# Patient Record
Sex: Female | Born: 1955 | Race: Black or African American | Hispanic: No | Marital: Single | Smoking: Current every day smoker
Health system: Southern US, Community
[De-identification: ages and names within clinical notes are randomized; demographics above are authoritative.]

## PROBLEM LIST (undated history)

## (undated) DIAGNOSIS — I5020 Unspecified systolic (congestive) heart failure: Secondary | ICD-10-CM

## (undated) DIAGNOSIS — E785 Hyperlipidemia, unspecified: Secondary | ICD-10-CM

## (undated) DIAGNOSIS — I5022 Chronic systolic (congestive) heart failure: Secondary | ICD-10-CM

## (undated) DIAGNOSIS — I4729 Other ventricular tachycardia: Secondary | ICD-10-CM

## (undated) DIAGNOSIS — Z91199 Patient's noncompliance with other medical treatment and regimen due to unspecified reason: Secondary | ICD-10-CM

## (undated) DIAGNOSIS — I2510 Atherosclerotic heart disease of native coronary artery without angina pectoris: Secondary | ICD-10-CM

## (undated) DIAGNOSIS — I2119 ST elevation (STEMI) myocardial infarction involving other coronary artery of inferior wall: Secondary | ICD-10-CM

## (undated) DIAGNOSIS — I255 Ischemic cardiomyopathy: Secondary | ICD-10-CM

## (undated) DIAGNOSIS — I10 Essential (primary) hypertension: Secondary | ICD-10-CM

---

## 2020-07-14 ENCOUNTER — Encounter: Payer: Self-pay | Admitting: Emergency Medicine

## 2020-07-14 ENCOUNTER — Ambulatory Visit (INDEPENDENT_AMBULATORY_CARE_PROVIDER_SITE_OTHER): Payer: Self-pay

## 2020-07-14 ENCOUNTER — Other Ambulatory Visit: Payer: Self-pay

## 2020-07-14 ENCOUNTER — Ambulatory Visit
Admission: EM | Admit: 2020-07-14 | Discharge: 2020-07-14 | Disposition: A | Discharge status: Home / Self Care | Payer: Self-pay | Attending: Sports Medicine | Admitting: Sports Medicine

## 2020-07-14 ENCOUNTER — Inpatient Hospital Stay
Admission: EM | Admit: 2020-07-14 | Discharge: 2020-07-20 | DRG: 280 | Disposition: A | Discharge status: Home / Self Care | Payer: Self-pay | Attending: Internal Medicine | Admitting: Internal Medicine

## 2020-07-14 ENCOUNTER — Encounter: Payer: Self-pay | Admitting: Internal Medicine

## 2020-07-14 DIAGNOSIS — Z79899 Other long term (current) drug therapy: Secondary | ICD-10-CM

## 2020-07-14 DIAGNOSIS — J189 Pneumonia, unspecified organism: Secondary | ICD-10-CM | POA: Insufficient documentation

## 2020-07-14 DIAGNOSIS — Z20822 Contact with and (suspected) exposure to covid-19: Secondary | ICD-10-CM | POA: Insufficient documentation

## 2020-07-14 DIAGNOSIS — J440 Chronic obstructive pulmonary disease with acute lower respiratory infection: Secondary | ICD-10-CM | POA: Diagnosis present

## 2020-07-14 DIAGNOSIS — F17200 Nicotine dependence, unspecified, uncomplicated: Secondary | ICD-10-CM | POA: Insufficient documentation

## 2020-07-14 DIAGNOSIS — I2109 ST elevation (STEMI) myocardial infarction involving other coronary artery of anterior wall: Secondary | ICD-10-CM | POA: Diagnosis present

## 2020-07-14 DIAGNOSIS — I2582 Chronic total occlusion of coronary artery: Secondary | ICD-10-CM | POA: Diagnosis present

## 2020-07-14 DIAGNOSIS — R0682 Tachypnea, not elsewhere classified: Secondary | ICD-10-CM | POA: Insufficient documentation

## 2020-07-14 DIAGNOSIS — R000 Tachycardia, unspecified: Secondary | ICD-10-CM | POA: Insufficient documentation

## 2020-07-14 DIAGNOSIS — F419 Anxiety disorder, unspecified: Secondary | ICD-10-CM | POA: Diagnosis present

## 2020-07-14 DIAGNOSIS — I5023 Acute on chronic systolic (congestive) heart failure: Secondary | ICD-10-CM | POA: Diagnosis present

## 2020-07-14 DIAGNOSIS — R059 Cough, unspecified: Secondary | ICD-10-CM

## 2020-07-14 DIAGNOSIS — D72829 Elevated white blood cell count, unspecified: Secondary | ICD-10-CM | POA: Diagnosis not present

## 2020-07-14 DIAGNOSIS — R0602 Shortness of breath: Secondary | ICD-10-CM

## 2020-07-14 DIAGNOSIS — I25119 Atherosclerotic heart disease of native coronary artery with unspecified angina pectoris: Secondary | ICD-10-CM | POA: Diagnosis present

## 2020-07-14 DIAGNOSIS — I2510 Atherosclerotic heart disease of native coronary artery without angina pectoris: Secondary | ICD-10-CM | POA: Diagnosis present

## 2020-07-14 DIAGNOSIS — F17213 Nicotine dependence, cigarettes, with withdrawal: Secondary | ICD-10-CM | POA: Diagnosis present

## 2020-07-14 DIAGNOSIS — J441 Chronic obstructive pulmonary disease with (acute) exacerbation: Secondary | ICD-10-CM | POA: Diagnosis present

## 2020-07-14 DIAGNOSIS — R0982 Postnasal drip: Secondary | ICD-10-CM | POA: Insufficient documentation

## 2020-07-14 DIAGNOSIS — I110 Hypertensive heart disease with heart failure: Secondary | ICD-10-CM | POA: Diagnosis present

## 2020-07-14 DIAGNOSIS — Z7982 Long term (current) use of aspirin: Secondary | ICD-10-CM | POA: Insufficient documentation

## 2020-07-14 DIAGNOSIS — I272 Pulmonary hypertension, unspecified: Secondary | ICD-10-CM | POA: Diagnosis present

## 2020-07-14 DIAGNOSIS — R1110 Vomiting, unspecified: Secondary | ICD-10-CM | POA: Insufficient documentation

## 2020-07-14 DIAGNOSIS — J9601 Acute respiratory failure with hypoxia: Secondary | ICD-10-CM | POA: Diagnosis present

## 2020-07-14 DIAGNOSIS — Z716 Tobacco abuse counseling: Secondary | ICD-10-CM

## 2020-07-14 DIAGNOSIS — Z9119 Patient's noncompliance with other medical treatment and regimen: Secondary | ICD-10-CM

## 2020-07-14 DIAGNOSIS — Z720 Tobacco use: Secondary | ICD-10-CM | POA: Diagnosis present

## 2020-07-14 DIAGNOSIS — T380X5A Adverse effect of glucocorticoids and synthetic analogues, initial encounter: Secondary | ICD-10-CM | POA: Diagnosis not present

## 2020-07-14 DIAGNOSIS — I255 Ischemic cardiomyopathy: Secondary | ICD-10-CM | POA: Diagnosis present

## 2020-07-14 DIAGNOSIS — E876 Hypokalemia: Secondary | ICD-10-CM | POA: Diagnosis present

## 2020-07-14 DIAGNOSIS — I340 Nonrheumatic mitral (valve) insufficiency: Secondary | ICD-10-CM | POA: Diagnosis present

## 2020-07-14 DIAGNOSIS — Y831 Surgical operation with implant of artificial internal device as the cause of abnormal reaction of the patient, or of later complication, without mention of misadventure at the time of the procedure: Secondary | ICD-10-CM | POA: Diagnosis present

## 2020-07-14 DIAGNOSIS — Z888 Allergy status to other drugs, medicaments and biological substances status: Secondary | ICD-10-CM

## 2020-07-14 DIAGNOSIS — I10 Essential (primary) hypertension: Secondary | ICD-10-CM | POA: Diagnosis present

## 2020-07-14 DIAGNOSIS — Z9114 Patient's other noncompliance with medication regimen: Secondary | ICD-10-CM

## 2020-07-14 DIAGNOSIS — I252 Old myocardial infarction: Secondary | ICD-10-CM

## 2020-07-14 DIAGNOSIS — I213 ST elevation (STEMI) myocardial infarction of unspecified site: Secondary | ICD-10-CM

## 2020-07-14 DIAGNOSIS — R0902 Hypoxemia: Secondary | ICD-10-CM | POA: Insufficient documentation

## 2020-07-14 DIAGNOSIS — I472 Ventricular tachycardia: Secondary | ICD-10-CM | POA: Diagnosis not present

## 2020-07-14 DIAGNOSIS — J40 Bronchitis, not specified as acute or chronic: Secondary | ICD-10-CM | POA: Diagnosis present

## 2020-07-14 DIAGNOSIS — E785 Hyperlipidemia, unspecified: Secondary | ICD-10-CM | POA: Diagnosis present

## 2020-07-14 DIAGNOSIS — T82867A Thrombosis of cardiac prosthetic devices, implants and grafts, initial encounter: Principal | ICD-10-CM | POA: Diagnosis present

## 2020-07-14 LAB — CBC WITH DIFFERENTIAL/PLATELET
Abs Immature Granulocytes: 0.02 10*3/uL (ref 0.00–0.07)
Basophils Absolute: 0 10*3/uL (ref 0.0–0.1)
Basophils Relative: 1 %
Eosinophils Absolute: 0 10*3/uL (ref 0.0–0.5)
Eosinophils Relative: 0 %
HCT: 42 % (ref 36.0–46.0)
Hemoglobin: 14.2 g/dL (ref 12.0–15.0)
Immature Granulocytes: 0 %
Lymphocytes Relative: 14 %
Lymphs Abs: 0.8 10*3/uL (ref 0.7–4.0)
MCH: 35 pg — ABNORMAL HIGH (ref 26.0–34.0)
MCHC: 33.8 g/dL (ref 30.0–36.0)
MCV: 103.4 fL — ABNORMAL HIGH (ref 80.0–100.0)
Monocytes Absolute: 0.3 10*3/uL (ref 0.1–1.0)
Monocytes Relative: 6 %
Neutro Abs: 4.6 10*3/uL (ref 1.7–7.7)
Neutrophils Relative %: 79 %
Platelets: 219 10*3/uL (ref 150–400)
RBC: 4.06 MIL/uL (ref 3.87–5.11)
RDW: 12.9 % (ref 11.5–15.5)
WBC: 5.8 10*3/uL (ref 4.0–10.5)
nRBC: 0 % (ref 0.0–0.2)

## 2020-07-14 LAB — COMPREHENSIVE METABOLIC PANEL
ALT: 19 U/L (ref 0–44)
AST: 85 U/L — ABNORMAL HIGH (ref 15–41)
Albumin: 4.3 g/dL (ref 3.5–5.0)
Alkaline Phosphatase: 75 U/L (ref 38–126)
Anion gap: 12 (ref 5–15)
BUN: 10 mg/dL (ref 8–23)
CO2: 28 mmol/L (ref 22–32)
Calcium: 9.3 mg/dL (ref 8.9–10.3)
Chloride: 98 mmol/L (ref 98–111)
Creatinine, Ser: 0.76 mg/dL (ref 0.44–1.00)
GFR, Estimated: >60 mL/min (ref >60)
Glucose, Bld: 109 mg/dL — ABNORMAL HIGH (ref 70–99)
Potassium: 3.6 mmol/L (ref 3.5–5.1)
Sodium: 138 mmol/L (ref 135–145)
Total Bilirubin: 1.5 mg/dL — ABNORMAL HIGH (ref 0.3–1.2)
Total Protein: 8.2 g/dL — ABNORMAL HIGH (ref 6.5–8.1)

## 2020-07-14 LAB — RESP PANEL BY RT-PCR (FLU A&B, COVID) ARPGX2
Influenza A by PCR: NEGATIVE
Influenza B by PCR: NEGATIVE
SARS Coronavirus 2 by RT PCR: NEGATIVE

## 2020-07-14 LAB — PROTIME-INR
INR: 1 (ref 0.8–1.2)
Prothrombin Time: 12.8 seconds (ref 11.4–15.2)

## 2020-07-14 LAB — LACTIC ACID, PLASMA: Lactic Acid, Venous: 1.5 mmol/L (ref 0.5–1.9)

## 2020-07-14 LAB — HIV ANTIBODY (ROUTINE TESTING W REFLEX): HIV Screen 4th Generation wRfx: NONREACTIVE

## 2020-07-14 LAB — TSH: TSH: 0.799 u[IU]/mL (ref 0.350–4.500)

## 2020-07-14 LAB — APTT: aPTT: 29 seconds (ref 24–36)

## 2020-07-14 MED ORDER — LORAZEPAM 2 MG/ML IJ SOLN
0.5000 mg | Freq: Once | INTRAMUSCULAR | Status: AC
  Administered 2020-07-14: 0.5 mg via INTRAVENOUS
  Filled 2020-07-14: qty 1

## 2020-07-14 MED ORDER — IPRATROPIUM-ALBUTEROL 0.5-2.5 (3) MG/3ML IN SOLN
3.0000 mL | Freq: Four times a day (QID) | RESPIRATORY_TRACT | Status: DC
  Administered 2020-07-14 – 2020-07-15 (×3): 3 mL via RESPIRATORY_TRACT
  Filled 2020-07-14 (×3): qty 3

## 2020-07-14 MED ORDER — LACTATED RINGERS IV SOLN: INTRAVENOUS | Status: DC

## 2020-07-14 MED ORDER — SODIUM CHLORIDE 0.9 % IV SOLN
500.0000 mg | INTRAVENOUS | Status: DC
  Administered 2020-07-14 – 2020-07-18 (×5): 500 mg via INTRAVENOUS
  Filled 2020-07-14 (×6): qty 500

## 2020-07-14 MED ORDER — ACETAMINOPHEN 650 MG RE SUPP: 325.0000 mg | Freq: Four times a day (QID) | RECTAL | Status: DC | PRN

## 2020-07-14 MED ORDER — NICOTINE 14 MG/24HR TD PT24
14.0000 mg | MEDICATED_PATCH | Freq: Every day | TRANSDERMAL | Status: DC
  Administered 2020-07-15 – 2020-07-20 (×5): 14 mg via TRANSDERMAL
  Filled 2020-07-14 (×6): qty 1

## 2020-07-14 MED ORDER — SODIUM CHLORIDE 0.9 % IV SOLN
2.0000 g | INTRAVENOUS | Status: DC
  Administered 2020-07-14 – 2020-07-18 (×5): 2 g via INTRAVENOUS
  Filled 2020-07-14 (×2): qty 2
  Filled 2020-07-14 (×3): qty 20
  Filled 2020-07-14: qty 2

## 2020-07-14 MED ORDER — DILTIAZEM HCL 25 MG/5ML IV SOLN
10.0000 mg | Freq: Once | INTRAVENOUS | Status: AC
  Administered 2020-07-14: 10 mg via INTRAVENOUS
  Filled 2020-07-14: qty 5

## 2020-07-14 MED ORDER — ACETAMINOPHEN 325 MG PO TABS: 325.0000 mg | ORAL_TABLET | Freq: Four times a day (QID) | ORAL | Status: DC | PRN

## 2020-07-14 MED ORDER — LACTATED RINGERS IV BOLUS
500.0000 mL | Freq: Once | INTRAVENOUS | Status: AC
  Administered 2020-07-14: 500 mL via INTRAVENOUS

## 2020-07-14 MED ORDER — HYDRALAZINE HCL 50 MG PO TABS
25.0000 mg | ORAL_TABLET | Freq: Three times a day (TID) | ORAL | Status: DC
  Administered 2020-07-14 – 2020-07-17 (×8): 25 mg via ORAL
  Filled 2020-07-14 (×8): qty 1

## 2020-07-14 MED ORDER — SODIUM CHLORIDE 0.9 % IV BOLUS (SEPSIS)
1000.0000 mL | Freq: Once | INTRAVENOUS | Status: AC
  Administered 2020-07-14: 1000 mL via INTRAVENOUS

## 2020-07-14 MED ORDER — ONDANSETRON HCL 4 MG/2ML IJ SOLN: 4.0000 mg | Freq: Four times a day (QID) | INTRAMUSCULAR | Status: DC | PRN

## 2020-07-14 MED ORDER — ONDANSETRON HCL 4 MG PO TABS: 4.0000 mg | ORAL_TABLET | Freq: Four times a day (QID) | ORAL | Status: DC | PRN

## 2020-07-14 MED ORDER — ENOXAPARIN SODIUM 40 MG/0.4ML ~~LOC~~ SOLN
40.0000 mg | SUBCUTANEOUS | Status: DC
  Administered 2020-07-14: 22:00:00 40 mg via SUBCUTANEOUS
  Filled 2020-07-14: qty 0.4

## 2020-07-14 MED ORDER — ORAL CARE MOUTH RINSE
15.0000 mL | Freq: Two times a day (BID) | OROMUCOSAL | Status: DC
  Administered 2020-07-16 – 2020-07-20 (×8): 15 mL via OROMUCOSAL

## 2020-07-14 NOTE — ED Notes (Signed)
Patient sleeping. No acute distress

## 2020-07-14 NOTE — ED Triage Notes (Signed)
Patient c/o cough, shortness of breath that started yesterday. She states she has a history of getting bronchitis. Denies fever.

## 2020-07-14 NOTE — H&P (Addendum)
History and Physical   Elizabeth Herring YFV:[id number] DOB: 1955/07/26 DOA: 07/14/2020  PCP: Elizabeth Herring  Patient coming from: Urgent clinic  I have personally briefly reviewed patient's old medical records in [facility] EMR.  Chief Concern: Shortness of breath  HPI: Elizabeth Herring is a 65 y.o. female with medical history significant for tobacco abuse, presented to the emergency department from urgent care center for chief concerns of shortness of breath.  At the management clinic, patient desatted to 87% on room air.  She states that she has been short of breath for 2 days.  She denies fever, chills.  She endorses cough that is productive of yellow dark sputum.  She denies chest pain and feeling like this before.  She denies sick contacts.  She does not know how much cigarettes she smokes daily however she states that her and her friends switch on and off sharing cigarettes.  At bedside, patient was able to tell me her name and age and the location of hospital.  She is using accessory muscles to breathe however she took her nasal cannula off stating that she wants to go to the restroom to urinate.  Social history: She lives with her sister.  She is retired and formerly worked at the [employer].  She smokes tobacco cigarettes daily.  Denies alcohol and recreational drug use.  ROS: Constitutional: no weight change, no fever ENT/Mouth: no sore throat, no rhinorrhea Eyes: no eye pain, no vision changes Cardiovascular: no chest pain, no dyspnea,  no edema, no palpitations Respiratory: no cough, no sputum, no wheezing Gastrointestinal: no nausea, no vomiting, no diarrhea, no constipation Genitourinary: no urinary incontinence, no dysuria, no hematuria Musculoskeletal: no arthralgias, no myalgias Skin: no skin lesions, no pruritus, Neuro: + weakness, no loss of consciousness, no syncope Psych: no anxiety, no depression, + decrease appetite Heme/Lymph: no bruising,  no bleeding  ED Course: Discussed with ED provider, patient requiring hospitalization due to hypoxia secondary to pneumonia.  Vitals in the ED was initially afebrile with temperature of 98.1, respiration rate of 21, heart rate 122, blood pressure 202/132, satting at 94% on 3 L nasal cannula.  Assessment/Plan  Active Problems:   PNA (pneumonia)   Bronchitis due to tobacco use   Essential hypertension   Pneumonia-ceftriaxone and azithromycin -Duo nebs every 6 hours including now  Tobacco abuse-Nicotine patch  Sinus tachycardia and elevated BP-Cardizem injection 10 mg once -EKG ordered and pending  Hypertension-Cardizem injection 10 mg once -Hydralazine 25 mg every 8 hours p.o. -Recommend follow-up with primary care provider for essential hypertension chronic management  Possible COPD recommend outpatient pulmonary function testing  Chart reviewed.   DVT prophylaxis: Enoxaparin Code Status: Full code Diet: Regular Family Communication: No Disposition Plan: Pending clinical course Consults called: None at this time Admission status: Observation, MedSurg  No past medical history on file.  No past surgical history on file.  Social History:  reports that she has been smoking. She has never used smokeless tobacco. She reports current alcohol use. She reports that she does not use drugs.  No Known Allergies No family history on file. Family history: Family history reviewed and not pertinent  Prior to Admission medications   Not on File   Physical Exam: Vitals:   07/14/20 1626 07/14/20 1627  BP: (!) 159/105   Pulse: (!) 115   Resp: (!) 24   Temp: 98.1 F (36.7 C)   TempSrc: Oral   SpO2: 98%   Weight:  59 kg  Height:  5' (1.524 m)   Constitutional: appears age-appropriate, NAD, calm, comfortable Eyes: PERRL, lids and conjunctivae normal ENMT: Mucous membranes are moist. Posterior pharynx clear of any exudate or lesions. Age-appropriate dentition. Hearing  appropriate Neck: normal, supple, no masses, no thyromegaly Respiratory: clear to auscultation bilaterally, no wheezing, no crackles. Normal respiratory effort. No accessory muscle use.  Cardiovascular: Sinus tachycardia, mild wheezing throughout. No extremity edema. 2+ pedal pulses. No carotid bruits.  Abdomen: no tenderness, no masses palpated, no hepatosplenomegaly. Bowel sounds positive.  Musculoskeletal: no clubbing / cyanosis. No joint deformity upper and lower extremities. Good ROM, no contractures, no atrophy. Normal muscle tone.  Skin: no rashes, lesions, ulcers. No induration Neurologic: Sensation intact. Strength 5/5 in all 4.  Psychiatric: Normal judgment and insight. Alert and oriented x 3. Normal mood.   EKG: I personally reviewed showing sinus tachycardia with rate of 109, QTc 441.  Chest x-ray on Admission: I personally reviewed and I agree with radiologist reading as below.  DG Chest 2 View  Result Date: 07/14/2020 CLINICAL DATA:  Cough, shortness of breath EXAM: CHEST - 2 VIEW COMPARISON:  None. FINDINGS: Patchy right lower lobe opacity, suspicious for pneumonia. Suspected small bilateral pleural effusions. No frank interstitial edema. The heart is top-normal in size. Visualized osseous structures are within normal limits. IMPRESSION: Patchy right lower lobe opacity, suspicious for pneumonia. Suspected small bilateral pleural effusions. Electronically Signed   By: Charline Bills M.D.   On: 07/14/2020 14:32   Labs on Admission: I have personally reviewed following labs  CBC: Recent Labs  Lab 07/14/20 1655  WBC 5.8  NEUTROABS 4.6  HGB 14.2  HCT 42.0  MCV 103.4*  PLT 219   Basic Metabolic Panel: Recent Labs  Lab 07/14/20 1655  NA 138  K 3.6  CL 98  CO2 28  GLUCOSE 109*  BUN 10  CREATININE 0.76  CALCIUM 9.3   GFR: Estimated Creatinine Clearance: 57.8 mL/min (by C-G formula based on SCr of 0.76 mg/dL). Liver Function Tests: Recent Labs  Lab  07/14/20 1655  AST 85*  ALT 19  ALKPHOS 75  BILITOT 1.5*  PROT 8.2*  ALBUMIN 4.3   Coagulation Profile: Recent Labs  Lab 07/14/20 1655  INR 1.0   Elizabeth Herring D.O. [facility]  If 7PM-7AM, please contact overnight-coverage provider If 7AM-7PM, please contact day coverage provider [email or web address]  07/14/2020, 6:00 PM

## 2020-07-14 NOTE — ED Notes (Signed)
Patient is being discharged from the Urgent Care and sent to the Emergency Department via EMS . Per Dr. Zachery Dauer, patient is in need of higher level of care due to hypoxia, oxygen 87% on RA, pneumonia. Patient is aware and verbalizes understanding of plan of care.  Vitals:   07/14/20 1408  BP: (!) 169/122  Pulse: (!) 123  Resp: (!) 22  Temp: 98.6 F (37 C)  SpO2: 91%

## 2020-07-14 NOTE — ED Provider Notes (Signed)
[facility] Emergency Department Provider Note   ____________________________________________   Event Date/Time   First MD Initiated Contact with Patient 07/14/20 1621     (approximate)  I have reviewed the triage vital signs and the nursing notes.   HISTORY  Chief Complaint Cough    HPI Tessah Patchen is a 65 y.o. female with a past medical history of tobacco abuse who presents from urgent care via EMS after being found to have a right-sided pneumonia been having cough and chest pain over the last 5 days that has been worsening.  Patient describes a 7/10, aching, nonradiating substernal chest pain that is worse with coughing.  Patient was noted to be hypoxic to 87% on room air and dipped lower upon ambulation.  Patient placed on 3 L nasal cannula prior to arrival with improvement in her oxygenation.  Patient currently denies any vision changes, tinnitus, difficulty speaking, facial droop, sore throat, abdominal pain, nausea/vomiting/diarrhea, dysuria, or weakness/numbness/paresthesias in any extremity         No past medical history on file.  There are no problems to display for this patient.   No past surgical history on file.  Prior to Admission medications   Not on File    Allergies Patient has no known allergies.  No family history on file.  Social History Social History   Tobacco Use  . Smoking status: Current Every Day Smoker  . Smokeless tobacco: Never Used  Vaping Use  . Vaping Use: Never used  Substance Use Topics  . Alcohol use: Yes  . Drug use: Never    Review of Systems Constitutional: No fever/chills Eyes: No visual changes. ENT: No sore throat.  Endorses productive cough Cardiovascular: Endorses chest pain. Respiratory: Endorses shortness of breath. Gastrointestinal: No abdominal pain.  No nausea, no vomiting.  No diarrhea. Genitourinary: Negative for dysuria. Musculoskeletal: Negative for acute  arthralgias Skin: Negative for rash. Neurological: Negative for headaches, weakness/numbness/paresthesias in any extremity Psychiatric: Negative for suicidal ideation/homicidal ideation   ____________________________________________   PHYSICAL EXAM:  VITAL SIGNS: ED Triage Vitals  Enc Vitals Group     BP 07/14/20 1626 (!) 159/105     Pulse Rate 07/14/20 1626 (!) 115     Resp 07/14/20 1626 (!) 24     Temp 07/14/20 1626 98.1 F (36.7 C)     Temp Source 07/14/20 1626 Oral     SpO2 07/14/20 1626 98 %     Weight 07/14/20 1627 130 lb (59 kg)     Height 07/14/20 1627 5' (1.524 m)     Head Circumference --      Peak Flow --      Pain Score 07/14/20 1627 0     Pain Loc --      Pain Edu? --      Excl. in GC? --    Constitutional: Alert and oriented. Well appearing and in no acute distress. Eyes: Conjunctivae are normal. PERRL. Head: Atraumatic. Nose: No congestion/rhinnorhea. Mouth/Throat: Mucous membranes are moist. Neck: No stridor Cardiovascular: Grossly normal heart sounds.  Good peripheral circulation. Respiratory: Tachypneic.  3 L nasal cannula in place Gastrointestinal: Soft and nontender. No distention. Musculoskeletal: No obvious deformities Neurologic:  Normal speech and language. No gross focal neurologic deficits are appreciated. Skin:  Skin is warm and dry. No rash noted. Psychiatric: Mood and affect are normal. Speech and behavior are normal.  ____________________________________________   LABS (all labs ordered are listed, but only abnormal results are displayed)  Labs Reviewed  COMPREHENSIVE METABOLIC PANEL - Abnormal; Notable for the following components:      Result Value   Glucose, Bld 109 (*)    Total Protein 8.2 (*)    AST 85 (*)    Total Bilirubin 1.5 (*)    All other components within normal limits  CBC WITH DIFFERENTIAL/PLATELET - Abnormal; Notable for the following components:   MCV 103.4 (*)    MCH 35.0 (*)    All other components within  normal limits  RESP PANEL BY RT-PCR (FLU A&B, COVID) ARPGX2  CULTURE, BLOOD (ROUTINE X 2)  CULTURE, BLOOD (ROUTINE X 2)  URINE CULTURE  LACTIC ACID, PLASMA  PROTIME-INR  APTT  LACTIC ACID, PLASMA  URINALYSIS, COMPLETE (UACMP) WITH MICROSCOPIC   RADIOLOGY  ED MD interpretation: 2 view x-ray of the chest shows patchy right lower lobe opacity suspicious for pneumonia  Official radiology report(s): DG Chest 2 View  Result Date: 07/14/2020 CLINICAL DATA:  Cough, shortness of breath EXAM: CHEST - 2 VIEW COMPARISON:  None. FINDINGS: Patchy right lower lobe opacity, suspicious for pneumonia. Suspected small bilateral pleural effusions. No frank interstitial edema. The heart is top-normal in size. Visualized osseous structures are within normal limits. IMPRESSION: Patchy right lower lobe opacity, suspicious for pneumonia. Suspected small bilateral pleural effusions. Electronically Signed   By: Charline Bills M.D.   On: 07/14/2020 14:32    ____________________________________________   PROCEDURES  Procedure(s) performed (including Critical Care):  .Critical Care Performed by: Merwyn Katos, MD Authorized by: Merwyn Katos, MD   Critical care provider statement:    Critical care time (minutes):  35   Critical care time was exclusive of:  Separately billable procedures and treating other patients   Critical care was necessary to treat or prevent imminent or life-threatening deterioration of the following conditions:  Respiratory failure   Critical care was time spent personally by me on the following activities:  Discussions with consultants, evaluation of patient's response to treatment, examination of patient, ordering and performing treatments and interventions, ordering and review of laboratory studies, ordering and review of radiographic studies, pulse oximetry, re-evaluation of patient's condition, obtaining history from patient or surrogate and review of old charts   I assumed  direction of critical care for this patient from another provider in my specialty: no     Care discussed with: admitting provider   .1-3 Lead EKG Interpretation Performed by: Merwyn Katos, MD Authorized by: Merwyn Katos, MD     Interpretation: abnormal     ECG rate:  111   ECG rate assessment: tachycardic     Rhythm: sinus tachycardia     Ectopy: none     Conduction: normal       ____________________________________________   INITIAL IMPRESSION / ASSESSMENT AND PLAN / ED COURSE  As part of my medical decision making, I reviewed the following data within the electronic MEDICAL RECORD NUMBER Nursing notes reviewed and incorporated, Labs reviewed, EKG interpreted, Old chart reviewed, Radiograph reviewed and Notes from prior ED visits reviewed and incorporated        Presents with shortness of breath, cough, and malaise concerning for pneumonia.  DDx: PE, COPD exacerbation, Pneumothorax, TB, Atypical ACS, Esophageal Rupture, Toxic Exposure, Foreign Body Airway Obstruction.  Workup: CXR CBC, CMP, lactate, troponin  Given History, Exam, and Workup presentation most consistent with pneumonia.  Findings: Chest x-ray showing right lower lobe pneumonia  Tx: Ceftriaxone 1g IV Azithromycin 500mg  IV  1646 Reassessment: As patient is continuing to require  supplemental oxygenation for acute hypoxic respiratory failure, patient will require admission to the internal medicine service for further evaluation and management  Disposition: Admit      ____________________________________________   FINAL CLINICAL IMPRESSION(S) / ED DIAGNOSES  Final diagnoses:  None     ED Discharge Orders    None       Note:  This document was prepared using Dragon voice recognition software and may include unintentional dictation errors.   Merwyn Katos, MD 07/14/20 [phone]

## 2020-07-14 NOTE — ED Notes (Signed)
Ambulated patient from her room to the nurses station which is approx 30 feet. Patient became very SOB and oxygen sat dropped to 87% and heart rate increased to 136.

## 2020-07-14 NOTE — ED Notes (Signed)
Previous EKG did not cross over. Another EKG performed and Dr. Sedalia Muta notified.

## 2020-07-14 NOTE — ED Provider Notes (Signed)
[facility]    CSN: [id number] Arrival date & time: 07/14/20  1352      History   Chief Complaint Chief Complaint  Patient presents with  . Cough  . Shortness of Breath    HPI Elizabeth Herring is a 65 y.o. female.   Patient is a very pleasant 65 year old female who presents for evaluation of the above issue.  Patient has recently relocated to [facility] and does not have a primary care physician.  She lived in [place] for 4 years.  She is currently retired and stays at home.  She does admit to tobacco abuse.  She reports 2 days of cough and shortness of breath with activity.  She says she really has no symptoms at rest.  Her cough is productive of clear sputum.  She denies any fever shakes chills.  She did have one episode of post tussive emesis.  She denies any significant chest pain at the present time.  She also reports some postnasal drip and reports that she has chronic sinus issues.  No facial pain or pressure currently.  She takes Claritin for allergies.  She denies any COVID history of Covid exposure.  She has been vaccinated x2 and has received the booster.  No flu shot.  No nausea or diarrhea.  No abdominal pain or urinary symptoms.  When I came into the room the patient was tachypneic and tachycardic with O2 saturations of 94%.  This was at rest.       History reviewed. No pertinent past medical history.  Patient Active Problem List   Diagnosis Date Noted  . Pneumonia 07/15/2020  . PNA (pneumonia) 07/14/2020  . Bronchitis due to tobacco use 07/14/2020  . Essential hypertension 07/14/2020    Past Surgical History:  Procedure Laterality Date  . CORONARY/GRAFT ACUTE MI REVASCULARIZATION N/A 07/15/2020   Procedure: Coronary/Graft Acute MI Revascularization;  Surgeon: Alwyn Pea, MD;  Location: [facility] INVASIVE CV LAB;  Service: Cardiovascular;  Laterality: N/A;  . LEFT HEART CATH AND CORONARY ANGIOGRAPHY N/A 07/15/2020   Procedure: LEFT  HEART CATH AND CORONARY ANGIOGRAPHY;  Surgeon: Alwyn Pea, MD;  Location: [facility] INVASIVE CV LAB;  Service: Cardiovascular;  Laterality: N/A;    OB History   No obstetric history on file.      Home Medications    Prior to Admission medications   Medication Sig Start Date End Date Taking? Authorizing Provider  aspirin 81 MG EC tablet Take 81 mg by mouth daily.    [provider]  cetirizine (ZYRTEC) 10 MG tablet Take 10 mg by mouth daily.    [provider]  Multiple Vitamin (MULTI-VITAMIN) tablet Take 1 tablet by mouth daily.    [provider]    Family History History reviewed. No pertinent family history.  Social History Social History   Tobacco Use  . Smoking status: Current Every Day Smoker  . Smokeless tobacco: Never Used  Vaping Use  . Vaping Use: Never used  Substance Use Topics  . Alcohol use: Yes  . Drug use: Never     Allergies   Chlorhexidine   Review of Systems Review of Systems  Constitutional: Positive for fatigue. Negative for activity change, appetite change, chills, diaphoresis and fever.  HENT: Positive for congestion and postnasal drip. Negative for ear discharge, ear pain, sinus pressure, sinus pain and sore throat.   Eyes: Negative.   Respiratory: Positive for cough, chest tightness and shortness of breath.   Cardiovascular: Negative for chest  pain and palpitations.  Gastrointestinal: Negative.   Genitourinary: Negative.   Musculoskeletal: Negative.  Negative for myalgias.  Skin: Negative for rash.  Neurological: Positive for weakness. Negative for dizziness, tremors, syncope, speech difficulty, light-headedness, numbness and headaches.  All other systems reviewed and are negative.    Physical Exam Triage Vital Signs ED Triage Vitals  Enc Vitals Group     BP 07/14/20 1408 (!) 169/122     Pulse Rate 07/14/20 1408 (!) 123     Resp 07/14/20 1408 (!) 22     Temp 07/14/20 1408 98.6 F (37 C)     Temp  Source 07/14/20 1408 Oral     SpO2 07/14/20 1408 91 %     Weight 07/14/20 1406 130 lb (59 kg)     Height 07/14/20 1406 5' (1.524 m)     Head Circumference --      Peak Flow --      Pain Score 07/14/20 1406 8     Pain Loc --      Pain Edu? --      Excl. in GC? --    No data found.  Updated Vital Signs BP (!) 169/122 (BP Location: Left Arm)   Pulse (!) 120   Temp 98.6 F (37 C) (Oral)   Resp (!) 22   Ht 5' (1.524 m)   Wt 59 kg   SpO2 94%   BMI 25.39 kg/m   Visual Acuity Right Eye Distance:   Left Eye Distance:   Bilateral Distance:    Right Eye Near:   Left Eye Near:    Bilateral Near:     Physical Exam Vitals and nursing note reviewed.  Constitutional:      General: She is in acute distress.     Appearance: She is well-developed. She is not ill-appearing, toxic-appearing or diaphoretic.  HENT:     Head: Normocephalic and atraumatic.     Right Ear: Tympanic membrane normal.     Left Ear: Tympanic membrane normal.     Nose: Nose normal.     Mouth/Throat:     Mouth: Mucous membranes are moist.     Pharynx: Oropharynx is clear.  Eyes:     Extraocular Movements: Extraocular movements intact.     Pupils: Pupils are equal, round, and reactive to light.  Neck:     Thyroid: No thyromegaly.     Vascular: No hepatojugular reflux or JVD.     Trachea: No tracheal deviation.  Cardiovascular:     Rate and Rhythm: Regular rhythm. Tachycardia present.  No extrasystoles are present.    Pulses: No decreased pulses.     Heart sounds: Normal heart sounds. No murmur heard. No friction rub. No gallop.   Pulmonary:     Effort: Tachypnea present.     Breath sounds: Examination of the right-lower field reveals decreased breath sounds. Decreased breath sounds present. No wheezing, rhonchi or rales.  Abdominal:     General: Bowel sounds are normal.     Palpations: Abdomen is soft.     Tenderness: There is no abdominal tenderness. There is no guarding or rebound.  Musculoskeletal:      Cervical back: Normal range of motion and neck supple.  Lymphadenopathy:     Cervical: No cervical adenopathy.  Skin:    General: Skin is warm and dry.     Capillary Refill: Capillary refill takes less than 2 seconds.     Findings: No rash.  Neurological:     General: No  focal deficit present.     Mental Status: She is alert.      UC Treatments / Results  Labs (all labs ordered are listed, but only abnormal results are displayed) Labs Reviewed  SARS CORONAVIRUS 2 (TAT 6-24 HRS)    EKG   Radiology CARDIAC CATHETERIZATION  Result Date: 07/15/2020  Prox RCA lesion is 100% stenosed. CTO  Mid LM lesion is 25% stenosed.  Dist LAD lesion is 95% stenosed.  Prox LAD to Dist LAD lesion is 75% stenosed.  Dist LM to Prox LAD lesion is 50% stenosed.  Prox Cx to Mid Cx lesion is 100% stenosed. In-stent thrombosis of the stent IRA TIMI 0 flow  Moderate collaterals left to right left to left  Left ventricular function severely depressed left ventricular enlargement EF between 25 and 30% globally  Unsuccessful attempted PCI and stent to ostial old stent to the circumflex with in-stent thrombosis failure to cross with a wire  Conclusion STEMI presentation in house late recognition Troponins were greater than 27,000 EKG had diffuse ST elevation inferior laterally at admission and 24 hours later when STEMI was called Diagnostic cardiac cath showed severely depressed left ventricular function ejection fraction was between 25 and 30% with left ventricular enlargement Coronaries Left main large minor distal disease LAD was large diffuse 50 to 75% proximal to mid, diffuse 75 to 95% mid to distal Circumflex is large ostial stent occluded thrombotic in-stent thrombosis from an old stent TIMI 0 flow RCA medium to small a completely occluded proximally CTO Intervention Unsuccessful attempt at PCI and stent of in-stent thrombosis of old proximal stent to circumflex Unable to cross with a wire Patient  developed heart failure type symptoms Treated with Lasix Groin was mynx Patient was transferred to ICU for aggressive medical therapy for heart failure and post MI care   DG Chest Port 1 View  Result Date: 07/15/2020 CLINICAL DATA:  Pneumonia.  Shortness of breath.  Cough.  Fever. EXAM: PORTABLE CHEST 1 VIEW COMPARISON:  07/14/2020. FINDINGS: Cardiomegaly with and pulmonary venous congestion. Right base infiltrate most consistent pneumonia. Asymmetric edema could also present this fashion. Small moderate right-sided pleural effusion. No pneumothorax. IMPRESSION: 1. Cardiomegaly with pulmonary venous congestion. 2. Right base infiltrate most consistent with pneumonia. Asymmetric pulmonary edema could also present this fashion. Small moderate right pleural effusion. Electronically Signed   By: Maisie Fus  Register   On: 07/15/2020 12:32    Procedures Procedures (including critical care time)  Medications Ordered in UC Medications - No data to display  Initial Impression / Assessment and Plan / UC Course  I have reviewed the triage vital signs and the nursing notes.  Pertinent labs & imaging results that were available during my care of the patient were reviewed by me and considered in my medical decision making (see chart for details).  Clinical impression: 65 year old female with 2 days of cough and shortness of breath with activity.  When she presents today she has tachypneic to 22 breaths/min and is tachycardic to 120 bpm.  O2 saturation 94% on room air.  Treatment plan: 1.  The findings and treatment plan were discussed in detail with the patient.  Patient was in agreement. 2.  I recommended getting a chest x-ray.  The results are above.  Does show evidence suspicious for pneumonia. 3.  I asked the nursing staff to ambulate her in the office. She was only able to get about 30 feet before getting more tachypneic and her O2 saturations dropped to 87%. 4.  My concern is that she may be dry and  that she may have a more significant pneumonia.  She is very symptomatic with shortness of breath on exertion, hypoxia, tachypnea, and tachycardia. 5.  I consider getting an EKG, but given her clinical situation I felt it best to transfer her to the emergency room for higher level of care.  I did not draw any labs as I did not want to compromise her care in any way by delaying her treatment. 6.  EMS was contacted and the patient was transported to the emergency room.  She was stable but her condition was guarded at discharge. 7.  Her sister was in the car and came into the room and I fully updated her on the situation.  She indicated she had a family situation to take care of but would meet her sister in the emergency room when she completed that.  Patient was transferred via EMS.    Final Clinical Impressions(s) / UC Diagnoses   Final diagnoses:  Pneumonia of right lower lobe due to infectious organism  Shortness of breath  Hypoxia  Tachypnea  Tachycardia     Discharge Instructions     Your x-ray does show a right lower lobe pneumonia.  Also shows some pleural effusions which is fluid in your lungs.  Your oxygen saturation is quite low at rest and goes even lower with walking around where you get significantly fatigued.  Your breathing very fast.  My concern is that you will tire out at home.  I have recommended you go to the emergency room for further evaluation and potential admission to the hospital. I have contacted EMS and they will take you to the emergency room.    ED Prescriptions    None     PDMP not reviewed this encounter.   Delton SeeBarnes, Nelly Scriven, MD 07/16/20 [phone]

## 2020-07-14 NOTE — ED Notes (Addendum)
Patient was calling out from room. Patient had scooted down to end of bed stating that she cannot breathe. Patient was tachypnic and very anxious. Sats were 98% on 3L. Advised MD who ordered ativan for patient.

## 2020-07-14 NOTE — ED Notes (Signed)
Patient placed on 3L O2 via [place]. Oxygen sat improved to 94%.

## 2020-07-14 NOTE — Progress Notes (Signed)
CODE SEPSIS - PHARMACY COMMUNICATION  **Broad Spectrum Antibiotics should be administered within 1 hour of Sepsis diagnosis**  Time Code Sepsis Called/Page Received: 1638  Antibiotics Ordered: ceftriaxone/azithromycin  Time of 1st antibiotic administration: 1707     Pricilla Riffle ,PharmD Clinical Pharmacist  07/14/2020  5:19 PM

## 2020-07-14 NOTE — ED Notes (Signed)
Provided sandwich tray per patient request.

## 2020-07-14 NOTE — Sepsis Progress Note (Signed)
eLink is monitoring this Code Sepsis. °

## 2020-07-14 NOTE — ED Triage Notes (Signed)
Patient reported via EMS for pneumonia diagnosis at urgent care. States she was 88% on room air after ambulation. Reports she has been coughing for 1 day. No fever.

## 2020-07-14 NOTE — ED Notes (Signed)
Patient up to bathroom with assistance.

## 2020-07-14 NOTE — Discharge Instructions (Addendum)
Your x-ray does show a right lower lobe pneumonia.  Also shows some pleural effusions which is fluid in your lungs.  Your oxygen saturation is quite low at rest and goes even lower with walking around where you get significantly fatigued.  Your breathing very fast.  My concern is that you will tire out at home.  I have recommended you go to the emergency room for further evaluation and potential admission to the hospital. I have contacted EMS and they will take you to the emergency room.

## 2020-07-14 NOTE — Subjective & Objective (Signed)
History and Physical   Elizabeth Herring HWE:[id number] DOB: May 26, 1955 DOA: 07/14/2020  PCP: Patient, No Pcp Per (Confirm with patient/family/NH records and if not entered, this has to be entered at [facility] point of entry) Outpatient Specialists: *** Consulting civil engineer speciality and name if known) Patient coming from:   I have personally briefly reviewed patient's old medical records in [facility] EMR.  Chief Concern: ***  HPI: Elizabeth Herring is a 65 y.o. female with medical history significant for ***   (The initial 2-3 lines should be focused and good to copy and paste in the HPI section of the daily progress note).   (For level 3, the HPI must include 4+ descriptors: Location, Quality, Severity, Duration, Timing, Context, modifying factors, associated signs/symptoms and/or status of 3+ chronic problems.)   ROS:*** Constitutional: no weight change, no fever ENT/Mouth: no sore throat, no rhinorrhea Eyes: no eye pain, no vision changes Cardiovascular: no chest pain, no dyspnea,  no edema, no palpitations Respiratory: no cough, no sputum, no wheezing Gastrointestinal: no nausea, no vomiting, no diarrhea, no constipation Genitourinary: no urinary incontinence, no dysuria, no hematuria Musculoskeletal: no arthralgias, no myalgias Skin: no skin lesions, no pruritus, Neuro: + weakness, no loss of consciousness, no syncope Psych: no anxiety, no depression, + decrease appetite Heme/Lymph: no bruising, no bleeding  ED Course: ***  Assessment/Plan  Active Problems:   PNA (pneumonia)   Bronchitis due to tobacco use   Essential hypertension    *** Chart reviewed.   DVT prophylaxis: ***  Code Status:   Diet: *** Family Communication:   Disposition Plan:  Consults called: ***  Admission status: ***   No past medical history on file.  No past surgical history on file.  Social History:  reports that she has been smoking. She has never used smokeless tobacco. She reports  current alcohol use. She reports that she does not use drugs.  No Known Allergies No family history on file. Family history: Family history reviewed and not pertinent***  Prior to Admission medications   Not on File    Physical Exam: Vitals:   07/14/20 1626 07/14/20 1627  BP: (!) 159/105   Pulse: (!) 115   Resp: (!) 24   Temp: 98.1 F (36.7 C)   TempSrc: Oral   SpO2: 98%   Weight:  59 kg  Height:  5' (1.524 m)    Constitutional: appears ***, NAD, calm, comfortable Eyes: PERRL, lids and conjunctivae normal ENMT: Mucous membranes are moist. Posterior pharynx clear of any exudate or lesions. Age-appropriate dentition. Hearing appropriate/loss*** Neck: normal, supple, no masses, no thyromegaly Respiratory: clear to auscultation bilaterally, no wheezing, no crackles. Normal respiratory effort. No accessory muscle use.  Cardiovascular: Regular rate and rhythm, no murmurs / rubs / gallops. No extremity edema. 2+ pedal pulses. No carotid bruits.  Abdomen: no tenderness, no masses palpated, no hepatosplenomegaly. Bowel sounds positive.  Musculoskeletal: no clubbing / cyanosis. No joint deformity upper and lower extremities. Good ROM, no contractures, no atrophy. Normal muscle tone.  Skin: no rashes, lesions, ulcers. No induration Neurologic: Sensation intact. Strength 5/5 in all 4.  Psychiatric: Normal judgment and insight. Alert and oriented x 3. Normal mood.   EKG: independently reviewed, showing ***  Chest x-ray on Admission: I personally reviewed and I agree*** with radiologist reading as below.  DG Chest 2 View  Result Date: 07/14/2020 CLINICAL DATA:  Cough, shortness of breath EXAM: CHEST - 2 VIEW COMPARISON:  None. FINDINGS: Patchy right lower lobe opacity, suspicious for pneumonia. Suspected  small bilateral pleural effusions. No frank interstitial edema. The heart is top-normal in size. Visualized osseous structures are within normal limits. IMPRESSION: Patchy right lower lobe  opacity, suspicious for pneumonia. Suspected small bilateral pleural effusions. Electronically Signed   By: Charline Bills M.D.   On: 07/14/2020 14:32    Labs on Admission: I have personally reviewed following labs  CBC: Recent Labs  Lab 07/14/20 1655  WBC 5.8  NEUTROABS 4.6  HGB 14.2  HCT 42.0  MCV 103.4*  PLT 219   Basic Metabolic Panel: Recent Labs  Lab 07/14/20 1655  NA 138  K 3.6  CL 98  CO2 28  GLUCOSE 109*  BUN 10  CREATININE 0.76  CALCIUM 9.3   GFR: Estimated Creatinine Clearance: 57.8 mL/min (by C-G formula based on SCr of 0.76 mg/dL). Liver Function Tests: Recent Labs  Lab 07/14/20 1655  AST 85*  ALT 19  ALKPHOS 75  BILITOT 1.5*  PROT 8.2*  ALBUMIN 4.3   No results for input(s): LIPASE, AMYLASE in the last 168 hours. No results for input(s): AMMONIA in the last 168 hours. Coagulation Profile: Recent Labs  Lab 07/14/20 1655  INR 1.0     Calem Cocozza N Nardos Putnam D.O. [facility]  If 7PM-7AM, please contact overnight-coverage provider If 7AM-7PM, please contact day coverage provider [email or web address]  07/14/2020, 5:54 PM

## 2020-07-14 NOTE — ED Notes (Signed)
Patient returned to room, oxygen 90-91%

## 2020-07-15 ENCOUNTER — Encounter
Admission: EM | Disposition: A | Discharge status: Home / Self Care | Payer: Self-pay | Source: Home / Self Care | Attending: Internal Medicine

## 2020-07-15 ENCOUNTER — Observation Stay: Payer: Self-pay

## 2020-07-15 DIAGNOSIS — J810 Acute pulmonary edema: Secondary | ICD-10-CM

## 2020-07-15 DIAGNOSIS — J189 Pneumonia, unspecified organism: Secondary | ICD-10-CM | POA: Diagnosis present

## 2020-07-15 HISTORY — PX: LEFT HEART CATH AND CORONARY ANGIOGRAPHY: CATH118249

## 2020-07-15 HISTORY — PX: CORONARY/GRAFT ACUTE MI REVASCULARIZATION: CATH118305

## 2020-07-15 LAB — BASIC METABOLIC PANEL
Anion gap: 10 (ref 5–15)
BUN: 10 mg/dL (ref 8–23)
CO2: 25 mmol/L (ref 22–32)
Calcium: 8.8 mg/dL — ABNORMAL LOW (ref 8.9–10.3)
Chloride: 104 mmol/L (ref 98–111)
Creatinine, Ser: 0.73 mg/dL (ref 0.44–1.00)
GFR, Estimated: >60 mL/min (ref >60)
Glucose, Bld: 111 mg/dL — ABNORMAL HIGH (ref 70–99)
Potassium: 3.6 mmol/L (ref 3.5–5.1)
Sodium: 139 mmol/L (ref 135–145)

## 2020-07-15 LAB — POTASSIUM: Potassium: 2.9 mmol/L — ABNORMAL LOW (ref 3.5–5.1)

## 2020-07-15 LAB — CBC
HCT: 36.8 % (ref 36.0–46.0)
Hemoglobin: 12.5 g/dL (ref 12.0–15.0)
MCH: 35.5 pg — ABNORMAL HIGH (ref 26.0–34.0)
MCHC: 34 g/dL (ref 30.0–36.0)
MCV: 104.5 fL — ABNORMAL HIGH (ref 80.0–100.0)
Platelets: 185 10*3/uL (ref 150–400)
RBC: 3.52 MIL/uL — ABNORMAL LOW (ref 3.87–5.11)
RDW: 13 % (ref 11.5–15.5)
WBC: 7.4 10*3/uL (ref 4.0–10.5)
nRBC: 0 % (ref 0.0–0.2)

## 2020-07-15 LAB — POCT ACTIVATED CLOTTING TIME: Activated Clotting Time: 410 seconds

## 2020-07-15 LAB — PHOSPHORUS: Phosphorus: 1.6 mg/dL — ABNORMAL LOW (ref 2.5–4.6)

## 2020-07-15 LAB — BRAIN NATRIURETIC PEPTIDE: B Natriuretic Peptide: 1437.7 pg/mL — ABNORMAL HIGH (ref 0.0–100.0)

## 2020-07-15 LAB — TROPONIN I (HIGH SENSITIVITY)
Troponin I (High Sensitivity): >27000 ng/L (ref <18)
Troponin I (High Sensitivity): 25842 ng/L (ref <18)

## 2020-07-15 LAB — MAGNESIUM
Magnesium: 1.7 mg/dL (ref 1.7–2.4)
Magnesium: 2.6 mg/dL — ABNORMAL HIGH (ref 1.7–2.4)

## 2020-07-15 LAB — PROCALCITONIN: Procalcitonin: <0.1 ng/mL

## 2020-07-15 LAB — HEPARIN LEVEL (UNFRACTIONATED): Heparin Unfractionated: <0.1 IU/mL — ABNORMAL LOW (ref 0.30–0.70)

## 2020-07-15 LAB — MRSA PCR SCREENING: MRSA by PCR: NEGATIVE

## 2020-07-15 LAB — PROTIME-INR
INR: 1 (ref 0.8–1.2)
Prothrombin Time: 13.1 seconds (ref 11.4–15.2)

## 2020-07-15 LAB — APTT: aPTT: 30 seconds (ref 24–36)

## 2020-07-15 LAB — SARS CORONAVIRUS 2 (TAT 6-24 HRS): SARS Coronavirus 2: NEGATIVE

## 2020-07-15 SURGERY — CORONARY/GRAFT ACUTE MI REVASCULARIZATION
Anesthesia: Moderate Sedation

## 2020-07-15 MED ORDER — AMLODIPINE BESYLATE 10 MG PO TABS
10.0000 mg | ORAL_TABLET | Freq: Every day | ORAL | Status: DC
  Administered 2020-07-15 – 2020-07-16 (×2): 10 mg via ORAL
  Filled 2020-07-15 (×2): qty 1

## 2020-07-15 MED ORDER — IPRATROPIUM BROMIDE 0.02 % IN SOLN
RESPIRATORY_TRACT | Status: AC
  Filled 2020-07-15: qty 2.5

## 2020-07-15 MED ORDER — BIVALIRUDIN BOLUS VIA INFUSION - CUPID
INTRAVENOUS | Status: DC | PRN
  Administered 2020-07-15: 48.675 mg via INTRAVENOUS

## 2020-07-15 MED ORDER — LEVALBUTEROL HCL 1.25 MG/0.5ML IN NEBU: 1.2500 mg | INHALATION_SOLUTION | Freq: Four times a day (QID) | RESPIRATORY_TRACT | Status: DC

## 2020-07-15 MED ORDER — LEVALBUTEROL HCL 1.25 MG/0.5ML IN NEBU: 1.2500 mg | INHALATION_SOLUTION | RESPIRATORY_TRACT | Status: DC

## 2020-07-15 MED ORDER — ASPIRIN 81 MG PO CHEW
81.0000 mg | CHEWABLE_TABLET | Freq: Every day | ORAL | Status: DC
  Administered 2020-07-16 – 2020-07-20 (×5): 81 mg via ORAL
  Filled 2020-07-15 (×5): qty 1

## 2020-07-15 MED ORDER — FUROSEMIDE 10 MG/ML IJ SOLN
INTRAMUSCULAR | Status: AC
  Filled 2020-07-15: qty 8

## 2020-07-15 MED ORDER — FENTANYL CITRATE (PF) 100 MCG/2ML IJ SOLN
INTRAMUSCULAR | Status: DC | PRN
  Administered 2020-07-15: 25 ug via INTRAVENOUS

## 2020-07-15 MED ORDER — IPRATROPIUM BROMIDE 0.02 % IN SOLN: 0.5000 mg | Freq: Four times a day (QID) | RESPIRATORY_TRACT | Status: DC

## 2020-07-15 MED ORDER — IPRATROPIUM BROMIDE 0.02 % IN SOLN
0.5000 mg | RESPIRATORY_TRACT | Status: DC
  Administered 2020-07-15: 0.5 mg via RESPIRATORY_TRACT

## 2020-07-15 MED ORDER — METHYLPREDNISOLONE SODIUM SUCC 40 MG IJ SOLR
40.0000 mg | Freq: Three times a day (TID) | INTRAMUSCULAR | Status: DC
  Administered 2020-07-15 – 2020-07-17 (×7): 40 mg via INTRAVENOUS
  Filled 2020-07-15 (×7): qty 1

## 2020-07-15 MED ORDER — NITROGLYCERIN 0.4 MG SL SUBL: 0.4000 mg | SUBLINGUAL_TABLET | SUBLINGUAL | Status: DC | PRN

## 2020-07-15 MED ORDER — LEVALBUTEROL HCL 1.25 MG/0.5ML IN NEBU
1.2500 mg | INHALATION_SOLUTION | Freq: Four times a day (QID) | RESPIRATORY_TRACT | Status: DC
  Administered 2020-07-15 – 2020-07-19 (×17): 1.25 mg via RESPIRATORY_TRACT
  Filled 2020-07-15 (×21): qty 0.5

## 2020-07-15 MED ORDER — TICAGRELOR 90 MG PO TABS
ORAL_TABLET | ORAL | Status: AC
  Filled 2020-07-15: qty 2

## 2020-07-15 MED ORDER — ALPRAZOLAM 0.25 MG PO TABS
0.2500 mg | ORAL_TABLET | Freq: Two times a day (BID) | ORAL | Status: DC | PRN
  Administered 2020-07-15: 0.25 mg via ORAL
  Filled 2020-07-15: qty 1

## 2020-07-15 MED ORDER — TICAGRELOR 90 MG PO TABS
ORAL_TABLET | ORAL | Status: DC | PRN
  Administered 2020-07-15: 180 mg via ORAL

## 2020-07-15 MED ORDER — SODIUM CHLORIDE 0.9 % IV SOLN
250.0000 mL | INTRAVENOUS | Status: DC | PRN
Start: 2020-07-15 — End: 2020-07-20

## 2020-07-15 MED ORDER — LABETALOL HCL 5 MG/ML IV SOLN
10.0000 mg | INTRAVENOUS | Status: AC | PRN
Start: 2020-07-15 — End: 2020-07-16

## 2020-07-15 MED ORDER — FENTANYL CITRATE (PF) 100 MCG/2ML IJ SOLN
INTRAMUSCULAR | Status: AC
  Filled 2020-07-15: qty 2

## 2020-07-15 MED ORDER — IOHEXOL 300 MG/ML  SOLN
INTRAMUSCULAR | Status: DC | PRN
  Administered 2020-07-15: 200 mL

## 2020-07-15 MED ORDER — HEPARIN BOLUS VIA INFUSION
4000.0000 [IU] | Freq: Once | INTRAVENOUS | Status: AC
  Administered 2020-07-15: 18:00:00 4000 [IU] via INTRAVENOUS
  Filled 2020-07-15: qty 4000

## 2020-07-15 MED ORDER — ALPRAZOLAM 0.5 MG PO TABS
1.0000 mg | ORAL_TABLET | Freq: Two times a day (BID) | ORAL | Status: DC | PRN
  Administered 2020-07-15: 1 mg via ORAL
  Filled 2020-07-15: qty 2

## 2020-07-15 MED ORDER — MORPHINE SULFATE (PF) 2 MG/ML IV SOLN
1.0000 mg | INTRAVENOUS | Status: DC | PRN
Start: 2020-07-15 — End: 2020-07-20

## 2020-07-15 MED ORDER — HEPARIN (PORCINE) 25000 UT/250ML-% IV SOLN
INTRAVENOUS | Status: AC
  Filled 2020-07-15: qty 250

## 2020-07-15 MED ORDER — SODIUM CHLORIDE 0.9% FLUSH: 3.0000 mL | INTRAVENOUS | Status: DC | PRN

## 2020-07-15 MED ORDER — ONDANSETRON HCL 4 MG/2ML IJ SOLN: 4.0000 mg | Freq: Four times a day (QID) | INTRAMUSCULAR | Status: DC | PRN

## 2020-07-15 MED ORDER — FUROSEMIDE 10 MG/ML IJ SOLN
INTRAMUSCULAR | Status: AC
  Filled 2020-07-15: qty 4

## 2020-07-15 MED ORDER — MIDAZOLAM HCL 2 MG/2ML IJ SOLN
INTRAMUSCULAR | Status: DC | PRN
  Administered 2020-07-15 (×2): 1 mg via INTRAVENOUS

## 2020-07-15 MED ORDER — HYDRALAZINE HCL 20 MG/ML IJ SOLN: 10.0000 mg | INTRAMUSCULAR | Status: AC | PRN

## 2020-07-15 MED ORDER — LIDOCAINE HCL (PF) 1 % IJ SOLN
INTRAMUSCULAR | Status: DC | PRN
  Administered 2020-07-15: 10 mL

## 2020-07-15 MED ORDER — LABETALOL HCL 5 MG/ML IV SOLN
10.0000 mg | INTRAVENOUS | Status: DC | PRN
  Administered 2020-07-15: 09:00:00 10 mg via INTRAVENOUS
  Filled 2020-07-15: qty 4

## 2020-07-15 MED ORDER — LIDOCAINE HCL (PF) 1 % IJ SOLN
INTRAMUSCULAR | Status: AC
  Filled 2020-07-15: qty 30

## 2020-07-15 MED ORDER — BUDESONIDE 0.5 MG/2ML IN SUSP
0.5000 mg | Freq: Two times a day (BID) | RESPIRATORY_TRACT | Status: DC
  Administered 2020-07-15 – 2020-07-20 (×10): 0.5 mg via RESPIRATORY_TRACT
  Filled 2020-07-15 (×10): qty 2

## 2020-07-15 MED ORDER — HEPARIN (PORCINE) 25000 UT/250ML-% IV SOLN
750.0000 [IU]/h | INTRAVENOUS | Status: DC
  Administered 2020-07-15: 750 [IU]/h via INTRAVENOUS

## 2020-07-15 MED ORDER — FUROSEMIDE 10 MG/ML IJ SOLN
40.0000 mg | Freq: Once | INTRAMUSCULAR | Status: AC
  Administered 2020-07-15: 13:00:00 40 mg via INTRAVENOUS
  Filled 2020-07-15: qty 4

## 2020-07-15 MED ORDER — HEPARIN (PORCINE) IN NACL 2000-0.9 UNIT/L-% IV SOLN
INTRAVENOUS | Status: DC | PRN
Start: 2020-07-15 — End: 2020-07-15
  Administered 2020-07-15: 1000 mL

## 2020-07-15 MED ORDER — BIVALIRUDIN TRIFLUOROACETATE 250 MG IV SOLR
INTRAVENOUS | Status: AC
  Filled 2020-07-15: qty 250

## 2020-07-15 MED ORDER — METOPROLOL TARTRATE 25 MG PO TABS: 12.5000 mg | ORAL_TABLET | Freq: Two times a day (BID) | ORAL | Status: DC

## 2020-07-15 MED ORDER — METOPROLOL TARTRATE 50 MG PO TABS
25.0000 mg | ORAL_TABLET | Freq: Two times a day (BID) | ORAL | Status: DC
  Administered 2020-07-15 – 2020-07-16 (×3): 25 mg via ORAL
  Filled 2020-07-15 (×4): qty 1

## 2020-07-15 MED ORDER — ASPIRIN EC 81 MG PO TBEC
81.0000 mg | DELAYED_RELEASE_TABLET | Freq: Every day | ORAL | Status: DC
  Administered 2020-07-15: 81 mg via ORAL
  Filled 2020-07-15: qty 1

## 2020-07-15 MED ORDER — IPRATROPIUM BROMIDE 0.02 % IN SOLN
0.5000 mg | Freq: Four times a day (QID) | RESPIRATORY_TRACT | Status: DC
  Administered 2020-07-15 – 2020-07-19 (×16): 0.5 mg via RESPIRATORY_TRACT
  Filled 2020-07-15 (×18): qty 2.5

## 2020-07-15 MED ORDER — BUDESONIDE 0.25 MG/2ML IN SUSP
0.2500 mg | Freq: Two times a day (BID) | RESPIRATORY_TRACT | Status: DC
  Administered 2020-07-15: 0.25 mg via RESPIRATORY_TRACT
  Filled 2020-07-15: qty 2

## 2020-07-15 MED ORDER — MAGNESIUM SULFATE 2 GM/50ML IV SOLN
2.0000 g | Freq: Once | INTRAVENOUS | Status: AC
  Administered 2020-07-15: 2 g via INTRAVENOUS
  Filled 2020-07-15: qty 50

## 2020-07-15 MED ORDER — FUROSEMIDE 10 MG/ML IJ SOLN
INTRAMUSCULAR | Status: DC | PRN
  Administered 2020-07-15: 40 mg via INTRAVENOUS
  Administered 2020-07-15: 80 mg via INTRAVENOUS

## 2020-07-15 MED ORDER — LEVALBUTEROL HCL 1.25 MG/0.5ML IN NEBU
1.2500 mg | INHALATION_SOLUTION | RESPIRATORY_TRACT | Status: DC
  Administered 2020-07-15: 1.25 mg via RESPIRATORY_TRACT

## 2020-07-15 MED ORDER — LOSARTAN POTASSIUM 25 MG PO TABS
25.0000 mg | ORAL_TABLET | Freq: Two times a day (BID) | ORAL | Status: DC
  Administered 2020-07-16: 25 mg via ORAL
  Filled 2020-07-15 (×3): qty 1

## 2020-07-15 MED ORDER — ACETAMINOPHEN 325 MG PO TABS
650.0000 mg | ORAL_TABLET | ORAL | Status: DC | PRN
  Administered 2020-07-18: 650 mg via ORAL
  Filled 2020-07-15: qty 2

## 2020-07-15 MED ORDER — HEPARIN BOLUS VIA INFUSION: 4000.0000 [IU] | Freq: Once | INTRAVENOUS | Status: DC

## 2020-07-15 MED ORDER — HEPARIN (PORCINE) IN NACL 1000-0.9 UT/500ML-% IV SOLN
INTRAVENOUS | Status: AC
  Filled 2020-07-15: qty 1000

## 2020-07-15 MED ORDER — MIDAZOLAM HCL 2 MG/2ML IJ SOLN
INTRAMUSCULAR | Status: AC
  Filled 2020-07-15: qty 2

## 2020-07-15 MED ORDER — CETIRIZINE HCL 10 MG PO TABS
10.0000 mg | ORAL_TABLET | Freq: Every day | ORAL | Status: DC
  Administered 2020-07-15 – 2020-07-17 (×3): 10 mg via ORAL
  Filled 2020-07-15 (×5): qty 1

## 2020-07-15 MED ORDER — SODIUM CHLORIDE 0.9 % IV SOLN
INTRAVENOUS | Status: DC | PRN
  Administered 2020-07-15: 1.75 mg/kg/h via INTRAVENOUS

## 2020-07-15 MED ORDER — SODIUM CHLORIDE 0.9% FLUSH
3.0000 mL | Freq: Two times a day (BID) | INTRAVENOUS | Status: DC
  Administered 2020-07-16 – 2020-07-19 (×8): 3 mL via INTRAVENOUS

## 2020-07-15 SURGICAL SUPPLY — 17 items
BALLN TREK RX 2.5X15 (BALLOONS) ×2
BALLOON TREK RX 2.5X15 (BALLOONS) ×1 IMPLANT
CATH INFINITI 5FR JL4 (CATHETERS) ×2 IMPLANT
CATH INFINITI JR4 5F (CATHETERS) ×2 IMPLANT
CATH VISTA GUIDE 6FR XB3.5 (CATHETERS) ×2 IMPLANT
COVER EZ STRL 42X30 (DRAPES) ×2 IMPLANT
DEVICE CLOSURE MYNXGRIP 6/7F (Vascular Products) ×2 IMPLANT
KIT ENCORE 26 ADVANTAGE (KITS) ×2 IMPLANT
KIT MANI 3VAL PERCEP (MISCELLANEOUS) ×2 IMPLANT
NEEDLE PERC 18GX7CM (NEEDLE) ×2 IMPLANT
PACK CARDIAC CATH (CUSTOM PROCEDURE TRAY) ×2 IMPLANT
SHEATH AVANTI 6FR X 11CM (SHEATH) ×2 IMPLANT
WIRE ASAHI GRAND SLAM 180CM (WIRE) ×2 IMPLANT
WIRE G HI TQ BMW 190 (WIRE) ×2 IMPLANT
WIRE GUIDERIGHT .035X150 (WIRE) ×2 IMPLANT
WIRE HI TORQ WHISPER MS 190CM (WIRE) ×2 IMPLANT
WIRE RUNTHROUGH .014X180CM (WIRE) ×2 IMPLANT

## 2020-07-15 NOTE — Progress Notes (Addendum)
Elizabeth Herring with lab called with a critical troponin of >27,000. Paged MD and made aware of same.

## 2020-07-15 NOTE — Progress Notes (Signed)
Nurse notified of patients elevated HR, RR, and BP

## 2020-07-15 NOTE — Consult Note (Signed)
NAME:  Elizabeth Herring, MRN:  [id number], DOB:  August 23, 1955, LOS: 0 ADMISSION DATE:  07/14/2020, CONSULTATION DATE: 07/15/2020 REFERRING MD: Dr. Juliann Pares, CHIEF COMPLAINT: Shortness of Breath   Brief History:  65 yo female admitted to the medsurg unit with acute hypoxic respiratory failure secondary to pneumonia and AECOPD.  On 03/9 pt developed worsening acute hypoxic respiratory failure EKG revealed STEMI pt emergently transferred to cardiac cath lab, however unsuccessful attempt with PCI and stent placement per cardiology medically managing.  Transferred to ICU post cardiac cath with worsening acute hypoxic respiratory failure secondary to acute CHF exacerbation   History of Present Illness:  This is a 65 yo female who presented to [facility] ER on 03/8 via EMS after being diagnosed with right-sided pneumonia at urgent care.  She c/o shortness of breath, cough, and chest pain onset of symptoms 5 days prior to ER presentation.   ED course  Upon arrival to the ER pt c/o 7/10 aching, non radiating substernal chest pain symptoms worse with coughing.  She was also noted to be hypoxic with O2 sats @87 % on RA during ambulation.  Therefore, she was placed on 3L of O2 via nasal canula with improvement of oxygenation.  COVID-19/Influenza PCR negative, however CXR revealed RLL opacity concerning for pneumonia and suspected small bilateral pleural effusions.  She received ceftriaxone and azithromycin.  She was subsequently admitted to the [facility] unit for additional workup and treatment.    On 03/9 rapid response initiated due to pt developing severe respiratory distress. Pt noted to be in a tripod position with audible wheezes according to documentation. There was concern for possible COPD exacerbation.  However, EKG revealed ST elevation in the anterior lateral leads similar to previous EKG findings 24 hrs ago.  Lab results revealed troponin >27,000 also am labs revealed BNP 1,437.7.  Repeat CXR concerning for  right base infiltrate, pulmonary edema, and small right pleural effusion.  Pt emergently transported to the cardiac cath lab which revealed: RCA totally occluded proximally CTO; diffusely diseased LAD with 95% occlusion mid to distal; and circumflex was large with an occluded stent proximal to mid appeared to be IRA TIMI 0 flow.  Cardiologist Dr. 05/9 attempted intervention, however was unsuccessful due to inability to cross with a wire.  Therefore, case was aborted and recommended medical therapy.  Pt also noted to have continued acute respiratory failure secondary to pulmonary edema requiring transfer to the stepdown unit.  PCCM team consulted by cardiology to assist with management.   Past Medical History:  Bronchitis  Tobacco Abuse  Essential HTN  CAD s/p PCI and stent in the distant past in Juliann Pares EKG showed sinus tach with diffuse ST elevation inferior lateral  Significant Hospital Events:  03/8: Pt admitted to the progressive cardiac unit with acute hypoxic respiratory failure secondary to pneumonia  03/9: Rapid response called pt developed worsening acute hypoxic respiratory failure; EKG revealed ST elevation in anterior lateral leads similar to previous EKG and troponin >27.000 code STEMI initiated and pt transported to cardiac cath lab  03/9: Cardiology unsuccessfully attempted PCI and stent to ostial old stent to the circumflex with in-stent thrombosis failure to cross with a wire. Case aborted and cardiology recommended medical management.  Pt remained in acute respiratory failure suspected secondary to pulmonary edema.  She received 80 mh iv lasix x1 dose and required transfer to ICU post cardiac cath.  PCCM team consulted to assist with management  Consults:  Cardiology  Intensivist   Procedures:  Cardiac Catheterization  Significant Diagnostic Tests:  Cardiac Catherization 03/9>>STEMI presentation in house late recognition Troponins were greater than 27,000 EKG had  diffuse ST elevation inferior laterally at admission and 24 hours later when STEMI was called Diagnostic cardiac cath showed severely depressed left ventricular function ejection fraction was between 25 to 30% with left ventricular enlargement Coronaries Left main large minor distal disease  LAD was large diffuse 50 to 75% proximal to mid, diffuse 75 to 95% mid to distal Circumflex is large ostial stent occluded thrombotic in-stent thrombosis from an old stent TIMI 0 flow RCA medium to small a completely occluded proximally CTO.  Unsuccessful attempt at PCI and stent to ostial old stent to the circumflex with in-stent thrombosis failure to cross with a wire  Micro Data:  COVID-19/Influenza PCR 03/8>>negative  Blood x2 03/8>>negative  Urine 03/9>>  Antimicrobials:  Ceftriaxone 03/8>> Azithromycin 03/8>>  Interim History / Subjective:  Pt states her shortness of breath has improved although she does appear to be slightly tachypneic.   Objective   Blood pressure 129/85, pulse (!) 122, temperature 98.9 F (37.2 C), resp. rate (!) 24, height 5' (1.524 m), weight 64.9 kg, SpO2 98 %.    FiO2 (%):  [93 %] 93 %   Intake/Output Summary (Last 24 hours) at 07/15/2020 1915 Last data filed at 07/15/2020 1854 Gross per 24 hour  Intake 2038.29 ml  Output 750 ml  Net 1288.29 ml   Filed Weights   07/14/20 1627 07/14/20 2152  Weight: 59 kg 64.9 kg    Examination: General: acutely ill appearing female, in mild acute respiratory distress HENT: mild JVD present Lungs: faint wheezes throughout, slightly labored and tachypneic  Cardiovascular: sinus tach with ST depression, no R/G, 2+ radial and 2+ distal pulses, no edema  Abdomen: +BS x4, soft, non tender, non distened Extremities: normal bulk and tone, right groin vascular site present no bleeding or hematoma present  Neuro: alert and oriented, follows commands, PERRLA  GU: voiding via purewick   Assessment & Plan:   Acute hypoxic respiratory  failure secondary to acute CHF exacerbation, RLL pneumonia, and AECOPD  Hx: Tobacco acute and bronchitis  Prn Bipap or supplemental O2 for dyspnea and/or hypoxia  IV and nebulized steroids  Scheduled and prn bronchodilator therapy  Smoking cessation counseling provided  Continue nicotine patch  STEMI s/p cardiac catheterization 03/9 troponin 27,000~25,842- (unsuccessful attempt at PCI and stent to ostial old stent to the circumflex with in-stent thrombosis failure to cross with a wire) Acute CHF exacerbation~EF 25 to 30% via cardiac cath   Essential HTN  Hx: CAD s/p PCI and stent  Continuous telemetry monitoring  Trend troponin's  Diurese as bp tolerates Cardiology consulted appreciate input-recommending medical management due to unsuccessful PCI attempt; will continue recommended cardiac medications   RLL pneumonia  Trend WBC and monitor fever curve  Trend PCT  Follow cultures Will check MRSA PCR  Continue azithromycin and ceftriaxone   Best practice (evaluated daily)  Diet: Renal/Carb Modified  Pain/Anxiety/Delirium protocol (if indicated): prn morphine and prn xanax for anxiety  VAP protocol (if indicated): N/A DVT prophylaxis: Heparin gtt  GI prophylaxis: N/A Glucose control: N/A Mobility: Bedrest for now  Disposition: ICU   Goals of Care:  Last date of multidisciplinary goals of care discussion: N/A Family and staff present: Pt, care RN, ICU NP Summary of discussion: Discussed pt condition and current plan of care  Follow up goals of care discussion due: 07/15/2020 Code Status: Full Code   Labs   CBC:  Recent Labs  Lab 07/14/20 1655 07/15/20 0507  WBC 5.8 7.4  NEUTROABS 4.6  --   HGB 14.2 12.5  HCT 42.0 36.8  MCV 103.4* 104.5*  PLT 219 185    Basic Metabolic Panel: Recent Labs  Lab 07/14/20 1655 07/15/20 0507 07/15/20 1553  NA 138 139  --   K 3.6 3.6  --   CL 98 104  --   CO2 28 25  --   GLUCOSE 109* 111*  --   BUN 10 10  --   CREATININE 0.76 0.73   --   CALCIUM 9.3 8.8*  --   MG  --   --  1.7   GFR: Estimated Creatinine Clearance: 60.6 mL/min (by C-G formula based on SCr of 0.73 mg/dL). Recent Labs  Lab 07/14/20 1655 07/15/20 0507  WBC 5.8 7.4  LATICACIDVEN 1.5  --     Liver Function Tests: Recent Labs  Lab 07/14/20 1655  AST 85*  ALT 19  ALKPHOS 75  BILITOT 1.5*  PROT 8.2*  ALBUMIN 4.3   No results for input(s): LIPASE, AMYLASE in the last 168 hours. No results for input(s): AMMONIA in the last 168 hours.  ABG No results found for: PHART, PCO2ART, PO2ART, HCO3, TCO2, ACIDBASEDEF, O2SAT   Coagulation Profile: Recent Labs  Lab 07/14/20 1655 07/15/20 1714  INR 1.0 1.0    Cardiac Enzymes: No results for input(s): CKTOTAL, CKMB, CKMBINDEX, TROPONINI in the last 168 hours.  HbA1C: No results found for: HGBA1C  CBG: No results for input(s): GLUCAP in the last 168 hours.  Review of Systems: Positives in BOLD   Gen: Denies fever, chills, weight change, fatigue, night sweats HEENT: Denies blurred vision, double vision, hearing loss, tinnitus, sinus congestion, rhinorrhea, sore throat, neck stiffness, dysphagia PULM: shortness of breath, cough, sputum production, hemoptysis, wheezing CV: Denies chest pain, edema, orthopnea, paroxysmal nocturnal dyspnea, palpitations GI: Denies abdominal pain, nausea, vomiting, diarrhea, hematochezia, melena, constipation, change in bowel habits GU: Denies dysuria, hematuria, polyuria, oliguria, urethral discharge Endocrine: Denies hot or cold intolerance, polyuria, polyphagia or appetite change Derm: Denies rash, dry skin, scaling or peeling skin change Heme: Denies easy bruising, bleeding, bleeding gums Neuro: Denies headache, numbness, weakness, slurred speech, loss of memory or consciousness  Past Medical History:  She,  has no past medical history on file.   Surgical History:  History reviewed. No pertinent surgical history.   Social History:   reports that she has  been smoking. She has never used smokeless tobacco. She reports current alcohol use. She reports that she does not use drugs.   Family History:  Her family history is not on file.   Allergies No Known Allergies   Home Medications  Prior to Admission medications   Medication Sig Start Date End Date Taking? Authorizing Provider  aspirin 81 MG EC tablet Take 81 mg by mouth daily.   Yes [provider]  cetirizine (ZYRTEC) 10 MG tablet Take 10 mg by mouth daily.   Yes [provider]  Multiple Vitamin (MULTI-VITAMIN) tablet Take 1 tablet by mouth daily.    [provider]     Critical care time: 45 minutes     Sonda Rumble, AGNP  Pulmonary/Critical Care Pager [phone] (please enter 7 digits) PCCM Consult Pager [phone] (please enter 7 digits)

## 2020-07-15 NOTE — Progress Notes (Signed)
Patient uninsured. Tried to meet with her this afternoon to assess for resource needs, patient asleep. Will try again tomorrow.  Alfonso Ramus, [place] [phone]

## 2020-07-15 NOTE — Consult Note (Signed)
ANTICOAGULATION CONSULT NOTE - Initial Consult  Pharmacy Consult for Heparin Infusion Indication: STEMI  No Known Allergies  Patient Measurements: Height: 5' (152.4 cm) Weight: 64.9 kg (143 lb 1.3 oz) IBW/kg (Calculated) : 45.5 Heparin Dosing Weight: 64.9 kg  Vital Signs: Temp: 98.9 F (37.2 C) (03/09 1617) BP: 129/85 (03/09 1617) Pulse Rate: 122 (03/09 1617)  Labs: Recent Labs    07/14/20 1655 07/15/20 0507 07/15/20 1553 07/15/20 1714 07/15/20 1717  HGB 14.2 12.5  --   --   --   HCT 42.0 36.8  --   --   --   PLT 219 185  --   --   --   APTT 29  --   --  30  --   LABPROT 12.8  --   --  13.1  --   INR 1.0  --   --  1.0  --   HEPARINUNFRC  --   --   --   --  <0.10*  CREATININE 0.76 0.73  --   --   --   TROPONINIHS  --   --  >27,000* 27,517*  --     Estimated Creatinine Clearance: 60.6 mL/min (by C-G formula based on SCr of 0.73 mg/dL).   Medical History: History reviewed. No pertinent past medical history.  Assessment: Pharmacy has been consulted to initiate Heparin Infusion on 64yo patient with ST elevation in anterior lateral leads. Troponin level over 27,000 was found and code STEMI was called by hospitalist. Patient was brought to the Cardiac Cath lab with intention to treat but attempted intervention was unsuccessful. Received dose of enoxaparin on 3/8@2226 . Patient has no history of PTA anticoagulation use.   Goal of Therapy:  Heparin level 0.3-0.7 units/ml Monitor platelets by anticoagulation protocol: Yes   Plan:  Patient received Heparin bolus of 4000 units x 1 prior to Cath Lab transfer. While in Cath lab, received Angiomax infusion as well as 2,000 additional units of heparin. Will start heparin infusion at 750 units/hr Check anti-Xa level in 6 hours and daily while on heparin Continue to monitor H&H and platelets  Zalayah Pizzuto A Jackqulyn Mendel 07/15/2020,7:51 PM

## 2020-07-15 NOTE — Progress Notes (Signed)
PROGRESS NOTE    Elizabeth Herring  ITG:[id number] DOB: 1955-11-03 DOA: 07/14/2020 PCP: Patient, No Pcp Per    Assessment & Plan:   Active Problems:   PNA (pneumonia)   Bronchitis due to tobacco use   Essential hypertension   Pneumonia: continue on IV ceftriaxone, azithromycin. Started IV steroids. Continue on bronchodilators and encourage incentive spirometry. Possible COPD, will need outpatient PFTs   Pulmonary edema: d/c IVFs. IV lasix x 1  Tobacco abuse: nicotine patch to prevent w/drawal  HTN: s/p cardizem x 1. Continue on hydralazine and started amlodipine.    DVT prophylaxis: lovenox  Code Status: full  Family Communication:  Disposition Plan: likely d/c back home   Level of care: Med-Surg   Status is: Observation  The patient remains OBS appropriate and will d/c before 2 midnights.  Dispo: The patient is from: Home              Anticipated d/c is to: Home              Patient currently is not medically stable to d/c.   Difficult to place patient Yes       Consultants:      Procedures:    Antimicrobials: azithromycin, ceftriaxone    Subjective: Pt c/o anxiety & shortness of breath  Objective: Vitals:   07/14/20 2221 07/15/20 0126 07/15/20 0350 07/15/20 0544  BP: (!) 150/102 (!) 160/100  116/81  Pulse:  (!) 118  (!) 124  Resp:    20  Temp:  98.4 F (36.9 C)  98.6 F (37 C)  TempSrc:  Oral    SpO2:  100% 97% 100%  Weight:      Height:        Intake/Output Summary (Last 24 hours) at 07/15/2020 0719 Last data filed at 07/15/2020 0300 Gross per 24 hour  Intake 1012.74 ml  Output --  Net 1012.74 ml   Filed Weights   07/14/20 1627 07/14/20 2152  Weight: 59 kg 64.9 kg    Examination:  General exam: Appears calm and comfortable  Respiratory system: diminished breath sounds b/l. Wheezes b/l  Cardiovascular system: S1 & S2 +. No  rubs, gallops or clicks.  Gastrointestinal system: Abdomen is nondistended, soft and nontender.Normal  bowel sounds heard. Central nervous system: Alert and oriented. Moves all extremities  Psychiatry: Judgement and insight appear normal. Flat mood and affect    Data Reviewed: I have personally reviewed following labs and imaging studies  CBC: Recent Labs  Lab 07/14/20 1655 07/15/20 0507  WBC 5.8 7.4  NEUTROABS 4.6  --   HGB 14.2 12.5  HCT 42.0 36.8  MCV 103.4* 104.5*  PLT 219 185   Basic Metabolic Panel: Recent Labs  Lab 07/14/20 1655 07/15/20 0507  NA 138 139  K 3.6 3.6  CL 98 104  CO2 28 25  GLUCOSE 109* 111*  BUN 10 10  CREATININE 0.76 0.73  CALCIUM 9.3 8.8*   GFR: Estimated Creatinine Clearance: 72.6 mL/min (by C-G formula based on SCr of 0.73 mg/dL). Liver Function Tests: Recent Labs  Lab 07/14/20 1655  AST 85*  ALT 19  ALKPHOS 75  BILITOT 1.5*  PROT 8.2*  ALBUMIN 4.3   No results for input(s): LIPASE, AMYLASE in the last 168 hours. No results for input(s): AMMONIA in the last 168 hours. Coagulation Profile: Recent Labs  Lab 07/14/20 1655  INR 1.0   Cardiac Enzymes: No results for input(s): CKTOTAL, CKMB, CKMBINDEX, TROPONINI in the last 168 hours. BNP (last  3 results) No results for input(s): PROBNP in the last 8760 hours. HbA1C: No results for input(s): HGBA1C in the last 72 hours. CBG: No results for input(s): GLUCAP in the last 168 hours. Lipid Profile: No results for input(s): CHOL, HDL, LDLCALC, TRIG, CHOLHDL, LDLDIRECT in the last 72 hours. Thyroid Function Tests: Recent Labs    07/14/20 1655  TSH 0.799   Anemia Panel: No results for input(s): VITAMINB12, FOLATE, FERRITIN, TIBC, IRON, RETICCTPCT in the last 72 hours. Sepsis Labs: Recent Labs  Lab 07/14/20 1655  LATICACIDVEN 1.5    Recent Results (from the past 240 hour(s))  SARS CORONAVIRUS 2 (TAT 6-24 HRS) Nasopharyngeal Nasopharyngeal Swab     Status: None   Collection Time: 07/14/20  2:10 PM   Specimen: Nasopharyngeal Swab  Result Value Ref Range Status   SARS  Coronavirus 2 NEGATIVE NEGATIVE Final    Comment: (NOTE) SARS-CoV-2 target nucleic acids are NOT DETECTED.  The SARS-CoV-2 RNA is generally detectable in upper and lower respiratory specimens during the acute phase of infection. Negative results do not preclude SARS-CoV-2 infection, do not rule out co-infections with other pathogens, and should not be used as the sole basis for treatment or other patient management decisions. Negative results must be combined with clinical observations, patient history, and epidemiological information. The expected result is Negative.  Fact Sheet for Patients: HairSlick.no  Fact Sheet for Healthcare Providers: quierodirigir.com  This test is not yet approved or cleared by the [place] FDA and  has been authorized for detection and/or diagnosis of SARS-CoV-2 by FDA under an Emergency Use Authorization (EUA). This EUA will remain  in effect (meaning this test can be used) for the duration of the COVID-19 declaration under Se ction 564(b)(1) of the Act, 21 U.S.C. section 360bbb-3(b)(1), unless the authorization is terminated or revoked sooner.  Performed at [facility], [address], [place], [place] [address]   Resp Panel by RT-PCR (Flu A&B, Covid) Nasopharyngeal Swab     Status: None   Collection Time: 07/14/20  4:55 PM   Specimen: Nasopharyngeal Swab; Nasopharyngeal(NP) swabs in vial transport medium  Result Value Ref Range Status   SARS Coronavirus 2 by RT PCR NEGATIVE NEGATIVE Final    Comment: (NOTE) SARS-CoV-2 target nucleic acids are NOT DETECTED.  The SARS-CoV-2 RNA is generally detectable in upper respiratory specimens during the acute phase of infection. The lowest concentration of SARS-CoV-2 viral copies this assay can detect is 138 copies/mL. A negative result does not preclude SARS-Cov-2 infection and should not be used as the sole basis for treatment or other  patient management decisions. A negative result may occur with  improper specimen collection/handling, submission of specimen other than nasopharyngeal swab, presence of viral mutation(s) within the areas targeted by this assay, and inadequate number of viral copies(<138 copies/mL). A negative result must be combined with clinical observations, patient history, and epidemiological information. The expected result is Negative.  Fact Sheet for Patients:  BloggerCourse.com  Fact Sheet for Healthcare Providers:  SeriousBroker.it  This test is no t yet approved or cleared by the [place] FDA and  has been authorized for detection and/or diagnosis of SARS-CoV-2 by FDA under an Emergency Use Authorization (EUA). This EUA will remain  in effect (meaning this test can be used) for the duration of the COVID-19 declaration under Section 564(b)(1) of the Act, 21 U.S.C.section 360bbb-3(b)(1), unless the authorization is terminated  or revoked sooner.       Influenza A by PCR NEGATIVE  NEGATIVE Final   Influenza B by PCR NEGATIVE NEGATIVE Final    Comment: (NOTE) The Xpert Xpress SARS-CoV-2/FLU/RSV plus assay is intended as an aid in the diagnosis of influenza from Nasopharyngeal swab specimens and should not be used as a sole basis for treatment. Nasal washings and aspirates are unacceptable for Xpert Xpress SARS-CoV-2/FLU/RSV testing.  Fact Sheet for Patients: BloggerCourse.com  Fact Sheet for Healthcare Providers: SeriousBroker.it  This test is not yet approved or cleared by the [place] FDA and has been authorized for detection and/or diagnosis of SARS-CoV-2 by FDA under an Emergency Use Authorization (EUA). This EUA will remain in effect (meaning this test can be used) for the duration of the COVID-19 declaration under Section 564(b)(1) of the Act, 21 U.S.C. section  360bbb-3(b)(1), unless the authorization is terminated or revoked.  Performed at [facility], [address] [place], [place] [address]   Blood Culture (routine x 2)     Status: None (Preliminary result)   Collection Time: 07/14/20  4:55 PM   Specimen: BLOOD  Result Value Ref Range Status   Specimen Description BLOOD RIGHT ANTECUBITAL  Final   Special Requests   Final    BOTTLES DRAWN AEROBIC AND ANAEROBIC Blood Culture adequate volume   Culture   Final    NO GROWTH < 24 HOURS Performed at [facility], [address] [place], [place] [address]    Report Status PENDING  Incomplete  Blood Culture (routine x 2)     Status: None (Preliminary result)   Collection Time: 07/14/20  4:55 PM   Specimen: BLOOD  Result Value Ref Range Status   Specimen Description BLOOD LEFT ANTECUBITAL  Final   Special Requests   Final    BOTTLES DRAWN AEROBIC AND ANAEROBIC Blood Culture adequate volume   Culture   Final    NO GROWTH < 24 HOURS Performed at [facility], [address] [place], [place] [address]    Report Status PENDING  Incomplete         Radiology Studies: DG Chest 2 View  Result Date: 07/14/2020 CLINICAL DATA:  Cough, shortness of breath EXAM: CHEST - 2 VIEW COMPARISON:  None. FINDINGS: Patchy right lower lobe opacity, suspicious for pneumonia. Suspected small bilateral pleural effusions. No frank interstitial edema. The heart is top-normal in size. Visualized osseous structures are within normal limits. IMPRESSION: Patchy right lower lobe opacity, suspicious for pneumonia. Suspected small bilateral pleural effusions. Electronically Signed   By: Charline Bills M.D.   On: 07/14/2020 14:32        Scheduled Meds: . enoxaparin (LOVENOX) injection  40 mg Subcutaneous Q24H  . hydrALAZINE  25 mg Oral Q8H  . ipratropium-albuterol  3 mL Nebulization Q6H  . mouth rinse  15 mL Mouth Rinse BID  . nicotine  14 mg Transdermal Daily   Continuous  Infusions: . azithromycin Stopped (07/14/20 1900)  . cefTRIAXone (ROCEPHIN)  IV Stopped (07/14/20 1750)  . lactated ringers 150 mL/hr at 07/15/20 0128     LOS: 0 days    Time spent: 33 mins    Charise Killian, MD [facility] Pager [phone]  If 7PM-7AM, please contact night-coverage 07/15/2020, 7:19 AM

## 2020-07-15 NOTE — Progress Notes (Signed)
Came to see patient as rapid response nurse per request from patient's nurse Debi. Had already spoken with Debi about the patient due to her yellow MEWS this morning. When arrived in patient's room, patient appeared to be in tripod position in her armchair, audible wheezes bilaterally even without stethoscope, elevated RR with accessory muscle use, and patient could barely speak due to dyspnea. Per Debi with non verbal confirmation from patient, patient had been up to bedside commode and had sudden worsening of her shortness of breath. Patient denied asthma diagnosis or any respiratory medications outpatient. Patient had already received PO xanax earlier in the shift. Debi and I conferred with respiratory therapist Autumn and with Dr. Mayford Knife. Dr. Mayford Knife reviewing patient's medications and will make adjustments as needed, but discussed amongst team were adding IV steroids and adjusting breathing treatment medications and frequency. Patient's IV had become dislodged during beginning of dyspneic episode.

## 2020-07-15 NOTE — Progress Notes (Signed)
Vernona Rieger from telemetry called and stated that patient had a 26 beat run of V-Tach. Made MD aware, VS taken patient was red mews, called rapid response. MD ordered EKG.

## 2020-07-15 NOTE — Progress Notes (Signed)
   07/15/20 1456  Assess: MEWS Score  Temp 98.9 F (37.2 C)  BP (!) 136/99  Pulse Rate (!) 122  Resp (!) 28  Level of Consciousness Alert  SpO2 97 %  O2 Device Nasal Cannula  Patient Activity (if Appropriate) Ambulating  O2 Flow Rate (L/min) 3 L/min  Assess: MEWS Score  MEWS Temp 0  MEWS Systolic 0  MEWS Pulse 2  MEWS RR 2  MEWS LOC 0  MEWS Score 4  MEWS Score Color Red  Assess: if the MEWS score is Yellow or Red  Were vital signs taken at a resting state? Yes  Focused Assessment Change from prior assessment (see assessment flowsheet)  Early Detection of Sepsis Score *See Row Information* Low  MEWS guidelines implemented *See Row Information* Yes  Treat  MEWS Interventions Escalated (See documentation below)  Pain Scale 0-10  Pain Score 0  Take Vital Signs  Increase Vital Sign Frequency  Red: Q 1hr X 4 then Q 4hr X 4, if remains red, continue Q 4hrs  Escalate  MEWS: Escalate Red: discuss with charge nurse/RN and provider, consider discussing with RRT  Notify: Charge Nurse/RN  Name of Charge Nurse/RN Notified Theodosia Quay, RN  Date Charge Nurse/RN Notified 07/15/20  Time Charge Nurse/RN Notified 1458  Notify: Provider  Provider Name/Title Dr. Mayford Knife  Date Provider Notified 07/15/20  Time Provider Notified 1459  Notification Type Page  Notification Reason Change in status;Other (Comment) (Telemetry called and stated that pt had a 26 beat run of v-tach)  Provider response See new orders  Date of Provider Response 07/15/20  Time of Provider Response 1502  Notify: Rapid Response  Name of Rapid Response RN Notified Lonell Face, RN  Date Rapid Response Notified 07/15/20  Time Rapid Response Notified 1458  Document  Patient Outcome Other (Comment) (continue to monitor)  Progress note created (see row info) Yes

## 2020-07-15 NOTE — Progress Notes (Signed)
RN made aware of patients abnormal vitals

## 2020-07-15 NOTE — Progress Notes (Signed)
Patients BP, HR and RR have been elevated since she was admitted to the floor. MD was made aware. She was given her schedule medications and her PRN Labetalol once. Heard patient screaming help me from down the hall, went to room and patient was on [facility] and quickly went to the bed. She was grabbing up in the air and pulling on my shirt saying she could not breathe. She had audible wheezing heard without stethoscope. Messaged Rapid Response Nurse and she came to evaluate patient. RRN spoke to respiratory and MD. New orders for nebulizer and IV steroids. Moved patient to the chair because she is unable to lay down. Will continue to monitor.

## 2020-07-15 NOTE — Progress Notes (Signed)
Spoke with MD, d/t critical troponin, called Code STEMI. Patient transferred to cath lab.

## 2020-07-15 NOTE — Significant Event (Signed)
Rapid Response Event Note   Reason for Call :  Red MEWS  Initial Focused Assessment:  Rapid response RN arrived in patient's room with patient resting in the chair. Patient known to this nurse from early visit. Patient appears much more comfortable and at ease now compared to earlier in shift. Patient leaning back in her recliner, RR still elevated but now in the 20s instead of 30s and no accessory muscle use. Per Debi RN patient had run of VT that ended without intervention (Dr. Mayford Knife already aware and ordered 12 lead EKG). No audible wheezes. Patient did receive IV lasix amongst interventions for earlier dyspnea episode and MD already reviewing chart to see if further lab work needed including check of electrolytes and troponin levels.  Interventions:  None.  Plan of Care:  Patient to stay on 1C for now. Patient appears to be resting comfortably at this time. 1C staff obtaining 12 lead EKG and will contact MD with results when obtained. Will notify oncoming Rapid Response RN Madelin Rear about patient's situation. 1C staff aware to recall rapid response if needed.  Event Summary:   MD Notified: Dr.Williams Call Time: 14:58 Arrival Time: 15:00 End Time: 15:05  Bennie Dallas, RN

## 2020-07-15 NOTE — CV Procedure (Signed)
Brief post STEMI note STEMI was called by the hospitalist the patient had ST elevation in anterior lateral leads similar to what she had 24 hours previously patient denied any chest pain but had a rapid response called with respiratory distress troponins were found to be over 27,000 so code STEMI was then called. Patient remained chest pain-free but has significant dyspnea she has a known history of coronary artery disease including PCI and stent in the distant past in [place] EKG showed sinus tach with diffuse ST elevation inferior laterally.  Chest x-ray shows cardiomegaly with evidence of failure right sided effusion.  Though she is being treated for pneumonia my suspicion is this is mostly heart failure related to her acute event. Patient was brought to the cardiac Cath Lab with intention to treat Images suggest moderate to severely depressed left ventricular function globally RCA totally occluded proximally CTO Left system Left main large relatively free of disease LAD was large diffusely disease with a 95% mid to distal Circumflex was large with a occluded stent proximal to mid appears to be IRA TIMI 0 flow  Attempted intervention was unsuccessful unable to cross with a wire Case was aborted and medical therapy was then pursued Recommend ARB or Entresto Beta-blocker Aspirin Consider resuming heparin and 4-6 hours at previous rate Echocardiogram will be helpful for assessment of left ventricular function and valvular structure Critical care team was notified Patient may need BiPAP for respiratory failure and acute CHF Patient was given the IV Lasix during the procedure to help with volume overload BMP was pending We will transfer the care back to critical care hospitalist and [facility]

## 2020-07-16 ENCOUNTER — Encounter: Payer: Self-pay | Admitting: Internal Medicine

## 2020-07-16 ENCOUNTER — Inpatient Hospital Stay (HOSPITAL_COMMUNITY)
Admit: 2020-07-16 | Discharge: 2020-07-16 | Disposition: A | Discharge status: Home / Self Care | Payer: Self-pay | Attending: Cardiology | Admitting: Cardiology

## 2020-07-16 DIAGNOSIS — R059 Cough, unspecified: Secondary | ICD-10-CM

## 2020-07-16 DIAGNOSIS — J9601 Acute respiratory failure with hypoxia: Secondary | ICD-10-CM

## 2020-07-16 DIAGNOSIS — I10 Essential (primary) hypertension: Secondary | ICD-10-CM

## 2020-07-16 DIAGNOSIS — I213 ST elevation (STEMI) myocardial infarction of unspecified site: Secondary | ICD-10-CM

## 2020-07-16 DIAGNOSIS — E876 Hypokalemia: Secondary | ICD-10-CM

## 2020-07-16 DIAGNOSIS — I255 Ischemic cardiomyopathy: Secondary | ICD-10-CM

## 2020-07-16 DIAGNOSIS — Z720 Tobacco use: Secondary | ICD-10-CM

## 2020-07-16 DIAGNOSIS — R9431 Abnormal electrocardiogram [ECG] [EKG]: Secondary | ICD-10-CM

## 2020-07-16 DIAGNOSIS — J189 Pneumonia, unspecified organism: Secondary | ICD-10-CM

## 2020-07-16 DIAGNOSIS — J40 Bronchitis, not specified as acute or chronic: Secondary | ICD-10-CM

## 2020-07-16 LAB — MAGNESIUM
Magnesium: 2.5 mg/dL — ABNORMAL HIGH (ref 1.7–2.4)
Magnesium: 2.5 mg/dL — ABNORMAL HIGH (ref 1.7–2.4)

## 2020-07-16 LAB — COMPREHENSIVE METABOLIC PANEL
ALT: 38 U/L (ref 0–44)
AST: 182 U/L — ABNORMAL HIGH (ref 15–41)
Albumin: 3.7 g/dL (ref 3.5–5.0)
Alkaline Phosphatase: 61 U/L (ref 38–126)
Anion gap: 13 (ref 5–15)
BUN: 11 mg/dL (ref 8–23)
CO2: 27 mmol/L (ref 22–32)
Calcium: 8.9 mg/dL (ref 8.9–10.3)
Chloride: 98 mmol/L (ref 98–111)
Creatinine, Ser: 0.82 mg/dL (ref 0.44–1.00)
GFR, Estimated: >60 mL/min (ref >60)
Glucose, Bld: 138 mg/dL — ABNORMAL HIGH (ref 70–99)
Potassium: 2.9 mmol/L — ABNORMAL LOW (ref 3.5–5.1)
Sodium: 138 mmol/L (ref 135–145)
Total Bilirubin: 1.2 mg/dL (ref 0.3–1.2)
Total Protein: 7.4 g/dL (ref 6.5–8.1)

## 2020-07-16 LAB — BASIC METABOLIC PANEL
Anion gap: 14 (ref 5–15)
BUN: 17 mg/dL (ref 8–23)
CO2: 23 mmol/L (ref 22–32)
Calcium: 8.8 mg/dL — ABNORMAL LOW (ref 8.9–10.3)
Chloride: 99 mmol/L (ref 98–111)
Creatinine, Ser: 0.96 mg/dL (ref 0.44–1.00)
GFR, Estimated: >60 mL/min (ref >60)
Glucose, Bld: 177 mg/dL — ABNORMAL HIGH (ref 70–99)
Potassium: 3.9 mmol/L (ref 3.5–5.1)
Sodium: 136 mmol/L (ref 135–145)

## 2020-07-16 LAB — HEMOGLOBIN A1C
Hgb A1c MFr Bld: 5.6 % (ref 4.8–5.6)
Mean Plasma Glucose: 114.02 mg/dL

## 2020-07-16 LAB — LIPID PANEL
Cholesterol: 246 mg/dL — ABNORMAL HIGH (ref 0–200)
HDL: 93 mg/dL (ref >40)
LDL Cholesterol: 143 mg/dL — ABNORMAL HIGH (ref 0–99)
Total CHOL/HDL Ratio: 2.6 RATIO
Triglycerides: 50 mg/dL (ref <150)
VLDL: 10 mg/dL (ref 0–40)

## 2020-07-16 LAB — PHOSPHORUS
Phosphorus: 1.9 mg/dL — ABNORMAL LOW (ref 2.5–4.6)
Phosphorus: 4.7 mg/dL — ABNORMAL HIGH (ref 2.5–4.6)

## 2020-07-16 LAB — PROCALCITONIN: Procalcitonin: 0.14 ng/mL

## 2020-07-16 LAB — CBC
HCT: 36.4 % (ref 36.0–46.0)
Hemoglobin: 12.7 g/dL (ref 12.0–15.0)
MCH: 35.7 pg — ABNORMAL HIGH (ref 26.0–34.0)
MCHC: 34.9 g/dL (ref 30.0–36.0)
MCV: 102.2 fL — ABNORMAL HIGH (ref 80.0–100.0)
Platelets: 190 10*3/uL (ref 150–400)
RBC: 3.56 MIL/uL — ABNORMAL LOW (ref 3.87–5.11)
RDW: 13 % (ref 11.5–15.5)
WBC: 7.7 10*3/uL (ref 4.0–10.5)
nRBC: 0 % (ref 0.0–0.2)

## 2020-07-16 LAB — HEPARIN LEVEL (UNFRACTIONATED)
Heparin Unfractionated: 0.21 IU/mL — ABNORMAL LOW (ref 0.30–0.70)
Heparin Unfractionated: 0.17 IU/mL — ABNORMAL LOW (ref 0.30–0.70)
Heparin Unfractionated: 0.63 IU/mL (ref 0.30–0.70)

## 2020-07-16 LAB — GLUCOSE, CAPILLARY: Glucose-Capillary: 186 mg/dL — ABNORMAL HIGH (ref 70–99)

## 2020-07-16 LAB — TROPONIN I (HIGH SENSITIVITY): Troponin I (High Sensitivity): 16118 ng/L (ref <18)

## 2020-07-16 LAB — BRAIN NATRIURETIC PEPTIDE: B Natriuretic Peptide: 767.9 pg/mL — ABNORMAL HIGH (ref 0.0–100.0)

## 2020-07-16 MED ORDER — FUROSEMIDE 10 MG/ML IJ SOLN
20.0000 mg | Freq: Every day | INTRAMUSCULAR | Status: DC
  Administered 2020-07-16 – 2020-07-19 (×4): 20 mg via INTRAVENOUS
  Filled 2020-07-16 (×4): qty 2

## 2020-07-16 MED ORDER — HEPARIN (PORCINE) 25000 UT/250ML-% IV SOLN
1150.0000 [IU]/h | INTRAVENOUS | Status: DC
  Administered 2020-07-16 – 2020-07-17 (×2): 1150 [IU]/h via INTRAVENOUS
  Filled 2020-07-16 (×3): qty 250

## 2020-07-16 MED ORDER — ATORVASTATIN CALCIUM 20 MG PO TABS: 40.0000 mg | ORAL_TABLET | Freq: Every day | ORAL | Status: DC

## 2020-07-16 MED ORDER — LOSARTAN POTASSIUM 25 MG PO TABS
12.5000 mg | ORAL_TABLET | Freq: Every day | ORAL | Status: DC
  Filled 2020-07-16: qty 0.5

## 2020-07-16 MED ORDER — HEPARIN BOLUS VIA INFUSION
2000.0000 [IU] | Freq: Once | INTRAVENOUS | Status: AC
  Administered 2020-07-16: 2000 [IU] via INTRAVENOUS
  Filled 2020-07-16: qty 2000

## 2020-07-16 MED ORDER — POTASSIUM CHLORIDE CRYS ER 20 MEQ PO TBCR
40.0000 meq | EXTENDED_RELEASE_TABLET | Freq: Two times a day (BID) | ORAL | Status: AC
  Administered 2020-07-16 (×2): 40 meq via ORAL
  Filled 2020-07-16 (×2): qty 2

## 2020-07-16 MED ORDER — HEPARIN BOLUS VIA INFUSION
1000.0000 [IU] | Freq: Once | INTRAVENOUS | Status: AC
  Administered 2020-07-16: 1000 [IU] via INTRAVENOUS
  Filled 2020-07-16: qty 1000

## 2020-07-16 MED ORDER — POTASSIUM PHOSPHATES 15 MMOLE/5ML IV SOLN
45.0000 mmol | Freq: Once | INTRAVENOUS | Status: AC
  Administered 2020-07-16: 45 mmol via INTRAVENOUS
  Filled 2020-07-16: qty 15

## 2020-07-16 MED ORDER — ATORVASTATIN CALCIUM 80 MG PO TABS
80.0000 mg | ORAL_TABLET | Freq: Every day | ORAL | Status: DC
  Administered 2020-07-17 – 2020-07-20 (×4): 80 mg via ORAL
  Filled 2020-07-16: qty 4
  Filled 2020-07-16 (×2): qty 1
  Filled 2020-07-16: qty 4

## 2020-07-16 NOTE — Progress Notes (Signed)
   07/16/20 1000  Clinical Encounter Type  Visited With Patient and family together  Visit Type Initial;Spiritual support;Social support  Referral From Chaplain  Consult/Referral To [employer] visited PT while doing rounds. PT had a visitor, so chaplain said he would do a follow up visit later on.

## 2020-07-16 NOTE — Consult Note (Signed)
ANTICOAGULATION CONSULT NOTE  Pharmacy Consult for Heparin Infusion Indication: STEMI  Patient Measurements: Height: 5' (152.4 cm) Weight: 64.6 kg (142 lb 6.7 oz) IBW/kg (Calculated) : 45.5  Vital Signs: Temp: 98.1 F (36.7 C) (03/10 0500) Temp Source: Oral (03/10 0500) BP: 120/81 (03/10 0600) Pulse Rate: 98 (03/10 0600)  Labs: Recent Labs    07/14/20 1655 07/15/20 0507 07/15/20 1553 07/15/20 1714 07/15/20 1717 07/16/20 0156  HGB 14.2 12.5  --   --   --  12.7  HCT 42.0 36.8  --   --   --  36.4  PLT 219 185  --   --   --  190  APTT 29  --   --  30  --   --   LABPROT 12.8  --   --  13.1  --   --   INR 1.0  --   --  1.0  --   --   HEPARINUNFRC  --   --   --   --  <0.10* 0.21*  CREATININE 0.76 0.73  --   --   --  0.82  TROPONINIHS  --   --  >27,000* 54,008*  --   --     Estimated Creatinine Clearance: 58.9 mL/min (by C-G formula based on SCr of 0.82 mg/dL).   Medical History: History reviewed. No pertinent past medical history.  Assessment: Pharmacy has been consulted to initiate Heparin Infusion on 64yo patient with ST elevation in anterior lateral leads. Troponin level over 27,000 was found and code STEMI was called by hospitalist. Patient was brought to the Cardiac Cath lab with intention to treat but attempted intervention was unsuccessful.  Patient has no history of PTA anticoagulation use.    Goal of Therapy:  Heparin level 0.3-0.7 units/ml Monitor platelets by anticoagulation protocol: Yes    Plan:   Anti-Xa level subtherapeutic:   give bolus of 2000 units x 1  Increase heparin infusion rate to 1150 units/hr  Recheck anti-Xa level in 6 hours after rate change and daily while on heparin  Continue to monitor H&H and platelets  Burnis Medin, PharmD 07/16/2020 7:29 AM

## 2020-07-16 NOTE — Consult Note (Signed)
ANTICOAGULATION CONSULT NOTE - Initial Consult  Pharmacy Consult for Heparin Infusion Indication: STEMI  No Known Allergies  Patient Measurements: Height: 5' (152.4 cm) Weight: 64.6 kg (142 lb 6.7 oz) IBW/kg (Calculated) : 45.5 Heparin Dosing Weight: 64.9 kg  Vital Signs: Temp: 98.8 F (37.1 C) (03/10 0000) Temp Source: Oral (03/09 2000) BP: 125/77 (03/10 0200) Pulse Rate: 95 (03/10 0200)  Labs: Recent Labs    07/14/20 1655 07/15/20 0507 07/15/20 1553 07/15/20 1714 07/15/20 1717 07/16/20 0156  HGB 14.2 12.5  --   --   --  12.7  HCT 42.0 36.8  --   --   --  36.4  PLT 219 185  --   --   --  190  APTT 29  --   --  30  --   --   LABPROT 12.8  --   --  13.1  --   --   INR 1.0  --   --  1.0  --   --   HEPARINUNFRC  --   --   --   --  <0.10* 0.21*  CREATININE 0.76 0.73  --   --   --  0.82  TROPONINIHS  --   --  >27,000* 21,194*  --   --     Estimated Creatinine Clearance: 58.9 mL/min (by C-G formula based on SCr of 0.82 mg/dL).   Medical History: History reviewed. No pertinent past medical history.  Assessment: Pharmacy has been consulted to initiate Heparin Infusion on 65yo patient with ST elevation in anterior lateral leads. Troponin level over 27,000 was found and code STEMI was called by hospitalist. Patient was brought to the Cardiac Cath lab with intention to treat but attempted intervention was unsuccessful. Received dose of enoxaparin on 3/8@2226 . Patient has no history of PTA anticoagulation use.   03/10 0156 HL 0.21, subtherapeutic  Goal of Therapy:  Heparin level 0.3-0.7 units/ml Monitor platelets by anticoagulation protocol: Yes    Plan:  Will give bolus of 1000 units x 1. Will  Increase heparin infusion to 900 units/hr Recheck HL in 6 hours and daily while on heparin Continue to monitor H&H and platelets  Otelia Sergeant, PharmD, [facility] 07/16/2020 3:01 AM

## 2020-07-16 NOTE — Progress Notes (Addendum)
NAME:  Elizabeth BoxerDeborah Herring, MRN:  [id number], DOB:  Jan 14, 1956, LOS: 1 ADMISSION DATE:  07/14/2020, CONSULTATION DATE: 07/15/2020 REFERRING MD: Dr. Juliann Paresallwood, CHIEF COMPLAINT: Shortness of Breath   Brief History:  65 yo female admitted to the medsurg unit with acute hypoxic respiratory failure secondary to pneumonia and AECOPD.  On 03/9 pt developed worsening acute hypoxic respiratory failure EKG revealed STEMI pt emergently transferred to cardiac cath lab, however unsuccessful attempt with PCI and stent placement per cardiology medically managing.  Transferred to ICU post cardiac cath with worsening acute hypoxic respiratory failure secondary to acute CHF exacerbation   History of Present Illness:  This is a 65 yo female who [facility] via EMS after being diagnosed with right-sided pneumonia at urgent care.  She c/o shortness of breath, cough, and chest pain onset of symptoms 5 days prior to ER presentation.   ED course  Upon arrival to the ER pt c/o 7/10 aching, non radiating substernal chest pain symptoms worse with coughing.  She was also noted to be hypoxic with O2 sats @87 % on RA during ambulation.  Therefore, she was placed on 3L of O2 via nasal canula with improvement of oxygenation.  COVID-19/Influenza PCR negative, however CXR revealed RLL opacity concerning for pneumonia and suspected small bilateral pleural effusions.  She received ceftriaxone and azithromycin.  [facility] and [facility].    On 03/9 rapid response initiated due to pt developing severe respiratory distress. Pt noted to be in a tripod position with audible wheezes according to documentation. There was concern for possible COPD exacerbation.  However, EKG revealed ST elevation in the anterior lateral leads similar to previous EKG findings 24 hrs ago.  Lab results revealed troponin >27,000 also am labs revealed BNP 1,437.7.  Repeat CXR concerning for  right base infiltrate, pulmonary edema, and small right pleural effusion.  Pt emergently transported to the cardiac cath lab which revealed: RCA totally occluded proximally CTO; diffusely diseased LAD with 95% occlusion mid to distal; and circumflex was large with an occluded stent proximal to mid appeared to be IRA TIMI 0 flow.  Cardiologist Dr. Juliann Paresallwood attempted intervention, however was unsuccessful due to inability to cross with a wire.  Therefore, case was aborted and recommended medical therapy.  Pt also noted to have continued acute respiratory failure secondary to pulmonary edema requiring transfer to the stepdown unit.  PCCM team consulted by cardiology to assist with management.   Past Medical History:  Bronchitis  Tobacco Abuse  Essential HTN  CAD s/p PCI [place] stent [place] EKG showed sinus tach with diffuse ST elevation inferior lateral  Significant Hospital Events:  03/8: Pt admitted to the progressive cardiac unit with acute hypoxic respiratory failure secondary to pneumonia  03/9: Rapid response called pt developed worsening acute hypoxic respiratory failure; EKG revealed ST elevation in anterior lateral leads similar to previous EKG and troponin >27.000 code STEMI initiated and pt transported to cardiac cath lab  03/9: Cardiology unsuccessfully attempted PCI and stent to ostial old stent to the circumflex with in-stent thrombosis failure to cross with a wire. Case aborted and cardiology recommended medical management.  Pt remained in acute respiratory failure suspected secondary to pulmonary edema.  She received 80 mh iv lasix x1 dose and required transfer to ICU post cardiac cath.  PCCM team consulted to assist with management  Consults:  Cardiology  Intensivist   Procedures:  Cardiac Catheterization  Significant Diagnostic Tests:  Cardiac Catherization 03/9>>STEMI presentation in house late recognition Troponins were greater than 27,000 EKG had  diffuse ST elevation inferior laterally at admission and 24 hours later when STEMI was called Diagnostic cardiac cath showed severely depressed left ventricular function ejection fraction was between 25 to 30% with left ventricular enlargement Coronaries Left main large minor distal disease  LAD was large diffuse 50 to 75% proximal to mid, diffuse 75 to 95% mid to distal Circumflex is large ostial stent occluded thrombotic in-stent thrombosis from an old stent TIMI 0 flow RCA medium to small a completely occluded proximally CTO.  Unsuccessful attempt at PCI and stent to ostial old stent to the circumflex with in-stent thrombosis failure to cross with a wire  Micro Data:  COVID-19/Influenza PCR 03/8>>negative  Blood x2 03/8>>negative  Urine 03/9>>  Antimicrobials:  Ceftriaxone 03/8>> Azithromycin 03/8>>  Interim History / Subjective:  Pt states her shortness of breath has improved although she does appear to be slightly tachypneic.   Objective   Blood pressure 101/69, pulse 96, temperature 98.1 F (36.7 C), temperature source Oral, resp. rate (!) 23, height 5' (1.524 m), weight 64.6 kg, SpO2 93 %.    FiO2 (%):  [100 %] 100 %   Intake/Output Summary (Last 24 hours) at 07/16/2020 1533 Last data filed at 07/16/2020 1000 Gross per 24 hour  Intake 653.65 ml  Output 3600 ml  Net -2946.35 ml   Filed Weights   07/14/20 1627 07/14/20 2152 07/15/20 2000  Weight: 59 kg 64.9 kg 64.6 kg    Examination: General: acutely ill appearing female, in mild acute respiratory distress HENT: mild JVD present Lungs: ctab Cardiovascular: sinus tach with ST depression, no R/G, 2+ radial and 2+ distal pulses, no edema  Abdomen: +BS x4, soft, non tender, non distened Extremities: normal bulk and tone, right groin vascular site present no bleeding or hematoma present  Neuro: alert and oriented, follows commands, PERRLA  GU: voiding via purewick   Assessment & Plan:   Acute hypoxic respiratory  failure-resolved now on room air -  secondary to acute CHF exacerbation, RLL pneumonia, and AECOPD   Hx: Tobacco acute and bronchitis  Prn Bipap or supplemental O2 for dyspnea and/or hypoxia  IV and nebulized steroids  Scheduled and prn bronchodilator therapy  Smoking cessation counseling provided  Continue nicotine patch  STEMI  s/p cardiac catheterization 03/9 troponin 27,000~25,842- (unsuccessful attempt at PCI and stent to ostial old stent to the circumflex with in-stent thrombosis failure to cross with a wire) Acute CHF exacerbation~EF 25 to 30% via cardiac cath   Essential HTN  Hx: CAD s/p PCI and stent  Continuous telemetry monitoring  Trend troponin's  Diurese as bp tolerates Cardiology consulted appreciate input-recommending medical management due to unsuccessful PCI attempt; will continue recommended cardiac medications   Possible RLL pneumonia  Trend WBC and monitor fever curve  Trend PCT  Follow cultures Will check MRSA PCR  Continue azithromycin and ceftriaxone    Best practice (evaluated daily)  Diet: Renal/Carb Modified  Pain/Anxiety/Delirium protocol (if indicated): prn morphine and prn xanax for anxiety  VAP protocol (if indicated): N/A DVT prophylaxis: Heparin gtt  GI prophylaxis: N/A Glucose control: N/A Mobility: Bedrest for now  Disposition: ICU   Goals of Care:  Last date of multidisciplinary goals of care discussion: N/A Family and staff present: Pt, care RN, ICU NP Summary of discussion: Discussed pt condition and current plan of care  Follow up goals of care discussion due: 07/15/2020 Code  Status: Full Code   Labs   CBC: Recent Labs  Lab 07/14/20 1655 07/15/20 0507 07/16/20 0156  WBC 5.8 7.4 7.7  NEUTROABS 4.6  --   --   HGB 14.2 12.5 12.7  HCT 42.0 36.8 36.4  MCV 103.4* 104.5* 102.2*  PLT 219 185 190    Basic Metabolic Panel: Recent Labs  Lab 07/14/20 1655 07/15/20 0507 07/15/20 1553 07/15/20 2235 07/16/20 0156 07/16/20 1149   NA 138 139  --   --  138 136  K 3.6 3.6  --  2.9* 2.9* 3.9  CL 98 104  --   --  98 99  CO2 28 25  --   --  27 23  GLUCOSE 109* 111*  --   --  138* 177*  BUN 10 10  --   --  11 17  CREATININE 0.76 0.73  --   --  0.82 0.96  CALCIUM 9.3 8.8*  --   --  8.9 8.8*  MG  --   --  1.7 2.6* 2.5* 2.5*  PHOS  --   --   --  1.6* 1.9* 4.7*   GFR: Estimated Creatinine Clearance: 50.3 mL/min (by C-G formula based on SCr of 0.96 mg/dL). Recent Labs  Lab 07/14/20 1655 07/15/20 0507 07/15/20 2050 07/16/20 0156  PROCALCITON  --   --  <0.10  --   WBC 5.8 7.4  --  7.7  LATICACIDVEN 1.5  --   --   --     Liver Function Tests: Recent Labs  Lab 07/14/20 1655 07/16/20 0156  AST 85* 182*  ALT 19 38  ALKPHOS 75 61  BILITOT 1.5* 1.2  PROT 8.2* 7.4  ALBUMIN 4.3 3.7   No results for input(s): LIPASE, AMYLASE in the last 168 hours. No results for input(s): AMMONIA in the last 168 hours.  ABG No results found for: PHART, PCO2ART, PO2ART, HCO3, TCO2, ACIDBASEDEF, O2SAT   Coagulation Profile: Recent Labs  Lab 07/14/20 1655 07/15/20 1714  INR 1.0 1.0    Cardiac Enzymes: No results for input(s): CKTOTAL, CKMB, CKMBINDEX, TROPONINI in the last 168 hours.  HbA1C: No results found for: HGBA1C  CBG: Recent Labs  Lab 07/15/20 1935  GLUCAP 186*    Review of Systems: Positives in BOLD   Gen: Denies fever, chills, weight change, fatigue, night sweats HEENT: Denies blurred vision, double vision, hearing loss, tinnitus, sinus congestion, rhinorrhea, sore throat, neck stiffness, dysphagia PULM: shortness of breath, cough, sputum production, hemoptysis, wheezing CV: Denies chest pain, edema, orthopnea, paroxysmal nocturnal dyspnea, palpitations GI: Denies abdominal pain, nausea, vomiting, diarrhea, hematochezia, melena, constipation, change in bowel habits GU: Denies dysuria, hematuria, polyuria, oliguria, urethral discharge Endocrine: Denies hot or cold intolerance, polyuria, polyphagia or  appetite change Derm: Denies rash, dry skin, scaling or peeling skin change Heme: Denies easy bruising, bleeding, bleeding gums Neuro: Denies headache, numbness, weakness, slurred speech, loss of memory or consciousness  Past Medical History:  She,  has no past medical history on file.   Surgical History:   Past Surgical History:  Procedure Laterality Date  . CORONARY/GRAFT ACUTE MI REVASCULARIZATION N/A 07/15/2020   Procedure: Coronary/Graft Acute MI Revascularization;  Surgeon: Alwyn Pea, MD;  Location: [facility] INVASIVE CV LAB;  Service: Cardiovascular;  Laterality: N/A;  . LEFT HEART CATH AND CORONARY ANGIOGRAPHY N/A 07/15/2020   Procedure: LEFT HEART CATH AND CORONARY ANGIOGRAPHY;  Surgeon: Alwyn Pea, MD;  Location: [facility] INVASIVE CV LAB;  Service: Cardiovascular;  Laterality: N/A;  Social History:   reports that she has been smoking. She has never used smokeless tobacco. She reports current alcohol use. She reports that she does not use drugs.   Family History:  Her family history is not on file.   Allergies Allergies  Allergen Reactions  . Chlorhexidine      Home Medications  Prior to Admission medications   Medication Sig Start Date End Date Taking? Authorizing Provider  aspirin 81 MG EC tablet Take 81 mg by mouth daily.   Yes [provider]  cetirizine (ZYRTEC) 10 MG tablet Take 10 mg by mouth daily.   Yes [provider]  Multiple Vitamin (MULTI-VITAMIN) tablet Take 1 tablet by mouth daily.    [provider]     Critical care provider statement:    Critical care time (minutes):  33   Critical care time was exclusive of:  Separately billable procedures and  treating other patients   Critical care was necessary to treat or prevent imminent or  life-threatening deterioration of the following conditions:  STEMI   Critical care was time spent personally by me on the following  activities:  Development of treatment plan with  patient or surrogate,  discussions with consultants, evaluation of patient's response to  treatment, examination of patient, obtaining history from patient or  surrogate, ordering and performing treatments and interventions, ordering  and review of laboratory studies and re-evaluation of patient's condition   I assumed direction of critical care for this patient from another  provider in my specialty: no        Vida Rigger, M.D.  Pulmonary & Critical Care Medicine  [facility]

## 2020-07-16 NOTE — Progress Notes (Signed)
*  PRELIMINARY RESULTS* Echocardiogram 2D Echocardiogram has been performed.  Elizabeth Herring 07/16/2020, 11:33 AM

## 2020-07-16 NOTE — Progress Notes (Signed)
PHARMACY CONSULT NOTE - FOLLOW UP  Pharmacy Consult for Electrolyte Monitoring and Replacement   Recent Labs: Potassium (mmol/L)  Date Value  07/16/2020 2.9 (L)   Magnesium (mg/dL)  Date Value  12/09/2334 2.5 (H)   Calcium (mg/dL)  Date Value  05/01/4974 8.9   Albumin (g/dL)  Date Value  30/09/1100 3.7   Phosphorus (mg/dL)  Date Value  03/25/3566 1.9 (L)   Sodium (mmol/L)  Date Value  07/16/2020 138    Assessment: 65 yo F presented with SOB and cough. CXR with RLL pneumonia. Pt with STEMI 3/9, PCI unsuccessful. Pt received Lasix 40 mg IV x 1 dose 3/9. Pharmacy has been consulted to manage electrolytes.   Goal of Therapy:  Electrolytes WNL  Plan:  -K 2.9 - replace with KCl 40 mEq PO x 2 doses  -Phos 1.9 - NP ordered potassium phosphate 45 mmol x 1 dose -All other electrolytes WNL, no additional replacement needed at this time -monitor electrolytes with AM labs  Reatha Armour, PharmD Pharmacy Resident  07/16/2020 7:26 AM

## 2020-07-16 NOTE — Consult Note (Addendum)
ANTICOAGULATION CONSULT NOTE  Pharmacy Consult for Heparin Infusion Indication: STEMI  Patient Measurements: Height: 5' (152.4 cm) Weight: 64.6 kg (142 lb 6.7 oz) IBW/kg (Calculated) : 45.5  Vital Signs: Temp: 98.5 F (36.9 C) (03/10 2000) Temp Source: Oral (03/10 2000) BP: 101/69 (03/10 1200) Pulse Rate: 94 (03/10 2000)  Labs: Recent Labs    07/14/20 1655 07/15/20 0507 07/15/20 1553 07/15/20 1714 07/15/20 1717 07/16/20 0156 07/16/20 1149 07/16/20 2047  HGB 14.2 12.5  --   --   --  12.7  --   --   HCT 42.0 36.8  --   --   --  36.4  --   --   PLT 219 185  --   --   --  190  --   --   APTT 29  --   --  30  --   --   --   --   LABPROT 12.8  --   --  13.1  --   --   --   --   INR 1.0  --   --  1.0  --   --   --   --   HEPARINUNFRC  --   --   --   --    < > 0.21* 0.17* 0.63  CREATININE 0.76 0.73  --   --   --  0.82 0.96  --   TROPONINIHS  --   --  >27,000* 56,387*  --   --  16,118*  --    < > = values in this interval not displayed.    Estimated Creatinine Clearance: 50.3 mL/min (by C-G formula based on SCr of 0.96 mg/dL).   Medical History: History reviewed. No pertinent past medical history.  Assessment: Pharmacy has been consulted to initiate Heparin Infusion on 65yo patient with ST elevation in anterior lateral leads. Troponin level over 27,000 was found and code STEMI was called by hospitalist. Patient was brought to the Cardiac Cath lab with intention to treat but attempted intervention was unsuccessful.  Patient has no history of PTA anticoagulation use.    Goal of Therapy:  Heparin level 0.3-0.7 units/ml Monitor platelets by anticoagulation protocol: Yes   3/10 2047 HL 0.63 therapeutic x 1   Plan:  HL therapeutic. Continue heparin at 1150 units/hr.  Recheck HL in 6 hours to confirm  Continue to monitor H&H and platelets  Laureen Ochs, PharmD 07/16/2020 9:39 PM

## 2020-07-16 NOTE — Consult Note (Addendum)
Cardiology Consultation:   Patient ID: Elizabeth Herring MRN: [id number]; DOB: 1956/03/05  Admit date: 07/14/2020 Date of Consult: 07/16/2020  PCP:  Patient, No Pcp Per   [facility]  Cardiologist:  [facility], Dr. Mariah Milling rounding Advanced Practice Provider:  No care team member to display Electrophysiologist:  None 0746}    Patient Profile:   Elizabeth Herring is a 64 y.o. female with a hx of CAD, HFrEF, h/o STEMI l2005 with PCI to LCx, HTN, hyperlipidemia, obesity, current tobacco use, orthostatic hypotension, and who is being seen today for the evaluation of STEMI s/p catheterization 07/15/2020 by Dr. Juliann Pares at the request of Dr. Mayford Knife.  History of Present Illness:   Elizabeth Herring is a 65 year old female with complex history as above.  She is a current smoker at 1/2 pack/day (previously smoked 6 cigarettes/day in 2014). She is not currently on any cardiac medications.  She has not followed up with any other cardiologist since 2014 at [facility] as below.  She is retired and formerly worked at a [employer].  She lives with her sister in [place].    She has a previous history of MI with PCI to her left circumflex in 2005. 2005 LHC showed 100% LCx, 70% LAD, 70% RCA, 0% left main disease.    2014 echo performed at [facility] with 2014 EF 20 to 25%, severe global hypokinesis of the LV, mild LAE, moderate to severe MR, moderate TR, mild pulmonary hypertension, mild pulmonic regurgitation.  She was lost to follow-up after initial cardiology consult 11/2012 [facility] for orthostatic sx.  At that time, she was smoking 6 cigarettes/day with rare alcohol use.  She reported feeling dizzy if standing too quickly.  Her echo was reviewed with recommendation for aggressive risk factor modification as outlined in her care everywhere note.  She was then lost to follow-up and has not continued her cardiac medications.  She presented to [facility] emergency department  07/14/2020 after feeling weak or "off" over the weekend (starting Sunday 3/6), followed by progressive DOE/SOB starting Monday 07/13/20.  She presented to urgent care 3/8 and was then sent to the [facility] ED.  In the ED, she was noted to be using accessory muscles on exam.  She reported progressive shortness of breath, cough, and chest pain at that time. Of note, on consultation today, she does not report chest pain.  She reports her main concern was SOB/DOE. Her cough is reportedly chronic for some time and productive with clear phlegm.  She was started on 3 L nasal cannula oxygen for hypoxia.  Vitals significant for HTN, tachypnea, and sinus tachycardia.  BP 159/105, HR 115 bpm, RR 24. EKG without acute ST/T changes.  Chest x-ray showed patchy right lower lobe opacity, suspicious for pneumonia and bilateral pleural effusions.   She was started on IV ceftriaxone and azithromycin and admitted for suspected bronchitis and right lower lobe pneumonia.    Since the ED, she has received IV diltiazem, hydralazine 25 mg every 8 hours, and amlodipine.  She was started on a nicotine patch for tobacco withdrawal.  She has continued on IV antibiotics.  On 3/9, she was started on IV steroids, as well as bronchodilators.  IV fluids were reportedly started at admission and subsequently discontinued 3/9 due to pulmonary edema.    On 3/9, she received IV Lasix x1, as she was noted to be volume up with nursing notes indicating respiratory distress on several occasions. PRN  labetalol was added for ongoing elevated HR/BP.  Per RN notes, a 26 beat run of V. tach was noted on telemetry later that day (electrolytes with K 2.9).  High-sensitivity troponin was then obtained with value greater than 27,000.   EKG showed sinus tachycardia and diffuse ST elevation anterior laterally.  No chest pain reported; however, she continued to note shortness of breath.  Code STEMI was activated and patient transferred directly to the Cath Lab.   Subsequent LHC by Dr. Juliann Pares showed moderate to severely depressed LVSF (estimated 25-30%), RCA with CTO, mLM with 25%s, dLAD 95%s, pLAD-dLAD 75%s, dLM-pLAD 50%s, p-mLCx 100%s with in-stent thrombosis of the stent IRA TIMI 0 flow.  Moderate collaterals were noted from left to right and left to left.  Intervention with PCI was attempted but unsuccessful.  Case was thus aborted with recommendation for medical therapy.  During consultation today, she notes her symptoms as significantly improved.  She remains on nasal cannula oxygen but no longer exhibits use of accessory muscles while breathing.  She continues to report cough with clear phlegm.   History reviewed. No pertinent past medical history.  Past Surgical History:  Procedure Laterality Date  . CORONARY/GRAFT ACUTE MI REVASCULARIZATION N/A 07/15/2020   Procedure: Coronary/Graft Acute MI Revascularization;  Surgeon: Alwyn Pea, MD;  Location: [facility] INVASIVE CV LAB;  Service: Cardiovascular;  Laterality: N/A;  . LEFT HEART CATH AND CORONARY ANGIOGRAPHY N/A 07/15/2020   Procedure: LEFT HEART CATH AND CORONARY ANGIOGRAPHY;  Surgeon: Alwyn Pea, MD;  Location: [facility] INVASIVE CV LAB;  Service: Cardiovascular;  Laterality: N/A;     Home Medications:  Prior to Admission medications   Medication Sig Start Date End Date Taking? Authorizing Provider  aspirin 81 MG EC tablet Take 81 mg by mouth daily.   Yes [provider]  cetirizine (ZYRTEC) 10 MG tablet Take 10 mg by mouth daily.   Yes [provider]  Multiple Vitamin (MULTI-VITAMIN) tablet Take 1 tablet by mouth daily.    [provider]    Inpatient Medications: Scheduled Meds: . amLODipine  10 mg Oral Daily  . aspirin  81 mg Oral Daily  . atorvastatin  40 mg Oral Daily  . budesonide (PULMICORT) nebulizer solution  0.5 mg Nebulization BID  . cetirizine  10 mg Oral Daily  . hydrALAZINE  25 mg Oral Q8H  . levalbuterol  1.25 mg Nebulization Q6H   And   . ipratropium  0.5 mg Nebulization Q6H  . losartan  25 mg Oral BID  . mouth rinse  15 mL Mouth Rinse BID  . methylPREDNISolone (SOLU-MEDROL) injection  40 mg Intravenous Q8H  . metoprolol tartrate  25 mg Oral BID  . nicotine  14 mg Transdermal Daily  . potassium chloride  40 mEq Oral BID  . sodium chloride flush  3 mL Intravenous Q12H   Continuous Infusions: . sodium chloride    . azithromycin 500 mg (07/15/20 1550)  . cefTRIAXone (ROCEPHIN)  IV 2 g (07/15/20 1711)  . heparin 1,150 Units/hr (07/16/20 1434)   PRN Meds: sodium chloride, acetaminophen, ALPRAZolam, morphine injection, nitroGLYCERIN, ondansetron (ZOFRAN) IV, ondansetron **OR** [DISCONTINUED] ondansetron (ZOFRAN) IV, sodium chloride flush  Allergies:    Allergies  Allergen Reactions  . Chlorhexidine     Social History:   Social History   Socioeconomic History  . Marital status: Single    Spouse name: Not on file  . Number of children: Not on file  . Years of education: Not on file  . Highest education level: Not on file  Occupational History  . Not on file  Tobacco Use  . Smoking status: Current Every Day Smoker  . Smokeless tobacco: Never Used  Vaping Use  . Vaping Use: Never used  Substance and Sexual Activity  . Alcohol use: Yes  . Drug use: Never  . Sexual activity: Not on file  Other Topics Concern  . Not on file  Social History Narrative  . Not on file   Social Determinants of Health   Financial Resource Strain: Not on file  Food Insecurity: Not on file  Transportation Needs: Not on file  Physical Activity: Not on file  Stress: Not on file  Social Connections: Not on file  Intimate Partner Violence: Not on file    Family History:   History reviewed. No pertinent family history.  No reported family history of heart disease on consultation.  ROS:  Please see the history of present illness.  Review of Systems  Constitutional: Positive for malaise/fatigue.  Respiratory: Positive for  cough, sputum production, shortness of breath and wheezing.   All other systems reviewed and are negative.   All other ROS reviewed and negative.     Physical Exam/Data:   Vitals:   07/16/20 1000 07/16/20 1100 07/16/20 1200 07/16/20 1351  BP: 104/73 98/72 101/69   Pulse: 90 89 96   Resp: (!) 23 (!) 35 (!) 23   Temp:      TempSrc:      SpO2: 98% 93% 95% 93%  Weight:      Height:        Intake/Output Summary (Last 24 hours) at 07/16/2020 1633 Last data filed at 07/16/2020 1000 Gross per 24 hour  Intake 653.65 ml  Output 3600 ml  Net -2946.35 ml   Last 3 Weights 07/15/2020 07/14/2020 07/14/2020  Weight (lbs) 142 lb 6.7 oz 143 lb 1.3 oz 130 lb  Weight (kg) 64.6 kg 64.9 kg 58.968 kg     Body mass index is 27.81 kg/m.  General:  Well nourished, well developed, in no acute distress HEENT: normal.  Nasal cannula oxygen in place. Lymph: no adenopathy Neck: JVD difficult to assess due to position of patient and nasal cannula oxygen Endocrine:  No thryomegaly Vascular: No carotid bruits appreciated; radial pulses 1+ bilaterally Cardiac:  normal S1, S2; tachycardic but regular with extrasystole appreciated; 2/6 systolic murmur Lungs: Bibasilar reduced breath sounds, bilateral wheezing Abd: soft, nontender, no hepatomegaly  Ext: no edema Musculoskeletal:  No deformities, BUE and BLE strength normal and equal Skin: warm and dry  Neuro:  CNs 2-12 intact, no focal abnormalities noted Psych:  Normal affect   EKG:  The EKG was personally reviewed and demonstrates: Sinus tachycardia, 119 bpm, biatrial enlargement, LVH with repolarization abnormalities, ST elevation noted in the inferior and anterior leads, T wave inversion in the lateral leads, prolonged QTC at 548 ms,  Telemetry:  Telemetry was personally reviewed and demonstrates: Sinus tachycardia, NSVT, frequent PVCs  Relevant CV Studies: Echo pending   LHC 07/15/20  Prox RCA lesion is 100% stenosed. CTO  Mid LM lesion is 25%  stenosed.  Dist LAD lesion is 95% stenosed.  Prox LAD to Dist LAD lesion is 75% stenosed.  Dist LM to Prox LAD lesion is 50% stenosed.  Prox Cx to Mid Cx lesion is 100% stenosed. In-stent thrombosis of the stent IRA TIMI 0 flow  Moderate collaterals left to right left to left  Left ventricular function severely depressed left ventricular enlargement EF between 25 and 30% globally  Unsuccessful  attempted PCI and stent to ostial old stent to the circumflex with in-stent thrombosis failure to cross with a wire Conclusion STEMI presentation in house late recognition Troponins were greater than 27,000 EKG had diffuse ST elevation inferior laterally at admission and 24 hours later when STEMI was called Diagnostic cardiac cath showed severely depressed left ventricular function ejection fraction was between 25 and 30% with left ventricular enlargement Coronaries Left main large minor distal disease  LAD was large diffuse 50 to 75% proximal to mid, diffuse 75 to 95% mid to distal Circumflex is large ostial stent occluded thrombotic in-stent thrombosis from an old stent TIMI 0 flow RCA medium to small a completely occluded proximally CTO Intervention Unsuccessful attempt at PCI and stent of in-stent thrombosis of old proximal stent to circumflex Unable to cross with a wire Patient developed heart failure type symptoms Treated with Lasix Groin was mynx Patient was transferred to ICU for aggressive medical therapy for heart failure and post MI care Coronary Diagrams   Diagnostic Dominance: Right   Previous echo 2014  PROCEDURE Study Quality: Technically adequate. - SUMMARY The left ventricle is severely dilated. There is normal left ventricular wall thickness.  Left ventricular systolic function is severely reduced. LV ejection fraction = 20-25%. There is severe global hypokinesis of the left ventricle. The right ventricle is normal in size and function. The left atrium is  mildly dilated. There is moderate to severe mitral regurgitation. There is moderate tricuspid regurgitation. Mild pulmonary hypertension. Mild pulmonic valvular regurgitation. There is no pericardial effusion. Compared to prior study, significant changes have occured. - FINDINGS:  LEFT VENTRICLE There is normal left ventricular wall thickness. The left ventricle is  severely dilated. Left ventricular systolic function is severely reduced. LV  ejection fraction = 20-25%. Left ventricular filling pattern is pseudonormal.  Lateral E/E' suggestive of increased filling pressures. There is severe  global hypokinesis of the left ventricle. -  RIGHT VENTRICLE The right ventricle is normal in size and function.  LEFT ATRIUM The left atrium is mildly dilated.  RIGHT ATRIUM  Right atrial size is normal. - AORTIC VALVE The aortic valve is normal in structure and function. The aortic valve is  trileaflet. Trace (trivial) aortic regurgitation. - MITRAL VALVE The mitral valve leaflets appear normal. There is no evidence of stenosis,  fluttering, or prolapse. There is moderate to severe mitral regurgitation. MR  likely function secondary to dilated cardiomyopathy. - TRICUSPID VALVE Structurally normal tricuspid valve. There is moderate tricuspid  regurgitation. Estimated right atrial pressure is 10 mmHg.. Right ventricular  systolic pressure is elevated at 41-35mmHg. Mild pulmonary hypertension. - PULMONIC VALVE The pulmonic valve is not well visualized. Mild pulmonic valvular  regurgitation. - - VENOUS Pulmonary venous flow pattern not well visualized. IVC size was mildly  dilated. - EFFUSION There is no pericardial effusion. - - MMode/2D Measurements & Calculations IVSd: 0.90 cm LVIDd: 6.4 cm LVPWd: 1.0 cm LVIDs: 5.9 cm LA dim: 4.2 cm Ao root: 2.9 cm EDV(MOD-sp4): 143.0 ml ESV(MOD-sp4): 118.0 ml EDV(MOD-sp2): 145.0 ml ESV(MOD-sp2): 119.0 ml LVOT diam: 1.8 cm LA A2  area: 26.1 cm2 LA length (vol): 5.4 cm LA vol: 98.6 ml LA vol index: 57.2 ml/m2 LAA (a4): 23.9 cm2 RAA (a4): 16.6 cm2 Doppler Measurements & Calculations MV E max vel: 127.7 cm/sec MV A max vel: 116.6 cm/sec MV E/A: 1.1 Lat Peak E' Vel: 5.2 cm/sec E/Lat E`: 24.5 MV dec time: 0.16 sec SV(LVOT): 31.1 ml Ao max PG: 5.5 mmHg Ao mean PG: 2.3  mmHg Ao V2 VTI: 12.5 cm AVA (VTI): 2.5 cm2 LV V1 VTI: 12.2 cm TR max vel: 294.1 cm/sec TR max PG: 34.6 mmHg RVSP(TR): 44.6 mmHg RAP systole: 10.0 mmHg   Laboratory Data:  High Sensitivity Troponin:   Recent Labs  Lab 07/15/20 1553 07/15/20 1714 07/16/20 1149  TROPONINIHS >27,000* 25,842* 16,118*     Chemistry Recent Labs  Lab 07/15/20 0507 07/15/20 2235 07/16/20 0156 07/16/20 1149  NA 139  --  138 136  K 3.6 2.9* 2.9* 3.9  CL 104  --  98 99  CO2 25  --  27 23  GLUCOSE 111*  --  138* 177*  BUN 10  --  11 17  CREATININE 0.73  --  0.82 0.96  CALCIUM 8.8*  --  8.9 8.8*  GFRNONAA >60  --  >60 >60  ANIONGAP 10  --  13 14    Recent Labs  Lab 07/14/20 1655 07/16/20 0156  PROT 8.2* 7.4  ALBUMIN 4.3 3.7  AST 85* 182*  ALT 19 38  ALKPHOS 75 61  BILITOT 1.5* 1.2   Hematology Recent Labs  Lab 07/14/20 1655 07/15/20 0507 07/16/20 0156  WBC 5.8 7.4 7.7  RBC 4.06 3.52* 3.56*  HGB 14.2 12.5 12.7  HCT 42.0 36.8 36.4  MCV 103.4* 104.5* 102.2*  MCH 35.0* 35.5* 35.7*  MCHC 33.8 34.0 34.9  RDW 12.9 13.0 13.0  PLT 219 185 190   BNP Recent Labs  Lab 07/15/20 0507  BNP 1,437.7*    DDimer No results for input(s): DDIMER in the last 168 hours.   Radiology/Studies:  DG Chest 2 View  Result Date: 07/14/2020 CLINICAL DATA:  Cough, shortness of breath EXAM: CHEST - 2 VIEW COMPARISON:  None. FINDINGS: Patchy right lower lobe opacity, suspicious for pneumonia. Suspected small bilateral pleural effusions. No frank interstitial edema. The heart is top-normal in size. Visualized osseous structures are within normal limits.  IMPRESSION: Patchy right lower lobe opacity, suspicious for pneumonia. Suspected small bilateral pleural effusions. Electronically Signed   By: Charline Bills M.D.   On: 07/14/2020 14:32   CARDIAC CATHETERIZATION  Result Date: 07/15/2020  Prox RCA lesion is 100% stenosed. CTO  Mid LM lesion is 25% stenosed.  Dist LAD lesion is 95% stenosed.  Prox LAD to Dist LAD lesion is 75% stenosed.  Dist LM to Prox LAD lesion is 50% stenosed.  Prox Cx to Mid Cx lesion is 100% stenosed. In-stent thrombosis of the stent IRA TIMI 0 flow  Moderate collaterals left to right left to left  Left ventricular function severely depressed left ventricular enlargement EF between 25 and 30% globally  Unsuccessful attempted PCI and stent to ostial old stent to the circumflex with in-stent thrombosis failure to cross with a wire  Conclusion STEMI presentation in house late recognition Troponins were greater than 27,000 EKG had diffuse ST elevation inferior laterally at admission and 24 hours later when STEMI was called Diagnostic cardiac cath showed severely depressed left ventricular function ejection fraction was between 25 and 30% with left ventricular enlargement Coronaries Left main large minor distal disease LAD was large diffuse 50 to 75% proximal to mid, diffuse 75 to 95% mid to distal Circumflex is large ostial stent occluded thrombotic in-stent thrombosis from an old stent TIMI 0 flow RCA medium to small a completely occluded proximally CTO Intervention Unsuccessful attempt at PCI and stent of in-stent thrombosis of old proximal stent to circumflex Unable to cross with a wire Patient developed heart failure type symptoms  Treated with Lasix Groin was mynx Patient was transferred to ICU for aggressive medical therapy for heart failure and post MI care   DG Chest Port 1 View  Result Date: 07/15/2020 CLINICAL DATA:  Pneumonia.  Shortness of breath.  Cough.  Fever. EXAM: PORTABLE CHEST 1 VIEW COMPARISON:  07/14/2020.  FINDINGS: Cardiomegaly with and pulmonary venous congestion. Right base infiltrate most consistent pneumonia. Asymmetric edema could also present this fashion. Small moderate right-sided pleural effusion. No pneumothorax. IMPRESSION: 1. Cardiomegaly with pulmonary venous congestion. 2. Right base infiltrate most consistent with pneumonia. Asymmetric pulmonary edema could also present this fashion. Small moderate right pleural effusion. Electronically Signed   By: Maisie Fus  Register   On: 07/15/2020 12:32     Assessment and Plan:   STEMI  Coronary artery disease with previous history of STEMI/PCI (2005) --No current chest pain.  Reports improvement in breathing.  High-sensitivity troponin >27,000, now downtrending. EKG with ST elevation noted in inferior and anterior leads as above and present in prior EKGs.  Code STEMI activated and cath performed by Dr. Juliann Pares with details as above showing diffuse disease.  Intervention was attempted but unsuccessful.  Recommendation was for medical management.  Cardiac cath images to be reviewed by [facility] interventional team with further recommendations regarding intervention if indicated at that time.  Echo is still pending official read with initial estimate approximately 13% for LVEF.  Previous EF 25 to 30% as above.   Avoid diltiazem. Avoid IVF.  IV heparin should be continued for at least 48 hours.  Continue ASA 81 mg, beta-blocker, and statin.   BB to be continued as BP allows and with consolidation to Toprol before discharge.   Discontinued CCB/ amlodipine given soft BP to allow for room in pressure.  Losartan dose changed from Losartan 25 mg BID   12.5 mg daily. Continue as tolerated by Cr/BP with eventual OP transition to [facility]. Remainder of GDMT recommendations as below.   Daily BMET, CBC.  Ordered A1c/lipid panel for further risk stratification  Continue to monitor on telemetry.    Aggressive risk factor modification. Smoking  cessation recommended.  Acute on chronic HFrEF --Continues on nasal cannula oxygen with breathing status improved but not yet back at baseline.  Shortness of breath likely multifactorial in the setting of underlying pneumonia and bronchitis.  3/9 BNP 1437.7.  As above, pending formal read of echo with initial estimate approximately 13% and previous echo showing EF 25 to 30%.    Still volume up.   No current diuresis with overnight slight bump in Cr and soft BP today. Will stop amlodipine for room in BP.  Reduced dose of losartan as above to 12.5mg  for further room in BP.   Restart gentle/ low-dose diuresis with low dose lasix once BP allows.  Escalate GDMT as tolerated.    Current BP precludes escalation of GDMT. Discontinued CCB.  Continue current BB as tolerated with consolidate before discharge to Toprol.  Reduced dose of losartan as above for further room in BP. Start diuresis once room in BP as tolerated / indicated. Future considerations include OP transition from [facility] to [facility] and addition of spironolactone-defer for now.  Monitor vitals closely.   Heart failure education recommended this admission.  Recommend set up OP follow-up with the heart failure clinic in [place].  Plans for OP follow-up with [facility] in [place], given she is closer to this location.  Sinus tachycardia, NSVT, PVCs --Continue current Lopressor with consolidation to Toprol as above.  TSH  WNL.  Wean steroids as tolerated.  Consider Xopenex and level of albuterol due to side effect profile.  Maintain electrolytes at goal.  Continue to monitor on telemetry.  HTN  --Current BP well controlled.  Continue current medications.  HLD --Continue statin.  Will increase to high intensity statin. Will order baseline lipid and liver function.  LDL goal below 70.  Hypokalemia, improved Hypomagnesia, resolved --Continue to replete with potassium goal 4.0.  Most recent potassium 3.9.  Magnesium at goal.  Discontinue IV magnesium.  Tobacco use, current --Continue nicotine patch.  Continue. Smoking cessation recommended.   Pneumonia, bronchitis --Per IM/pulmonary.  Recommend smoking cessation.  Wean steroids as tolerated.     TIMI Risk Score for ST  Elevation MI:   The patient's TIMI risk score is 8, which indicates a 26.8% risk of all cause mortality at 30 days.    New York Heart Association (NYHA) Functional Class NYHA Class IV        For questions or updates, please contact [facility] Please consult [email or web address] for contact info under    Signed, Lennon AlstromJacquelyn D Aslan Montagna, PA-C  07/16/2020 4:33 PM

## 2020-07-16 NOTE — Progress Notes (Signed)
PROGRESS NOTE    Elizabeth BoxerDeborah Herring  ZOX:[id number] DOB: 1955-09-29 DOA: 07/14/2020 PCP: Patient, No Pcp Per    Assessment & Plan:   Active Problems:   PNA (pneumonia)   Bronchitis due to tobacco use   Essential hypertension   Pneumonia  STEMI: w/ ST elevation in anterior lateral leads. Troponins >27,000. Cardiac cath was attempted but was unsuccessful & unable to cross w/ a wire. Continue on IV heparin drip, metoprolol, losartan as per cardio. Continue w/ medical therapy as per cardio. Echo is pending. Continue on tele. Hx of previous cardiac stent approx 10 years ago.   Pneumonia: continue on IV ceftriaxone, azithromycin, & steroids. Continue on bronchodilators and encourage incentive spirometry. Possible COPD, will need outpatient PFTs   Hypokalemia: KCl repleted. Will continue to monitor  Hypophosphatemia: phosp repleted. Will continue to monitor    Pulmonary edema: s/p IV lasix x1. Improved  Tobacco abuse: nicotine patch to prevent w/drawal  HTN: s/p cardizem x 1. Continue on hydralazine and started amlodipine.    DVT prophylaxis: lovenox  Code Status: full  Family Communication: discussed pt's care w/ pt's sister at bedside and answered her questions  Disposition Plan: likely d/c back home   Level of care: ICU   Status is: Inpatient  Remains inpatient appropriate because:Ongoing diagnostic testing needed not appropriate for outpatient work up, IV treatments appropriate due to intensity of illness or inability to take PO and Inpatient level of care appropriate due to severity of illness   Dispo: The patient is from: Home              Anticipated d/c is to: Home              Patient currently is not medically stable to d/c.   Difficult to place patient Yes    Consultants:   Cardio    Procedures:    Antimicrobials: azithromycin, ceftriaxone    Subjective: Pt c/o shortness of breath but much improved from day prior   Objective: Vitals:   07/16/20  0400 07/16/20 0500 07/16/20 0600 07/16/20 0757  BP: 114/76 113/75 120/81   Pulse: 98 95 98   Resp: (!) 21 (!) 26 (!) 27   Temp:  98.1 F (36.7 C)    TempSrc:  Oral    SpO2: 98% 99% 98% 98%  Weight:      Height:        Intake/Output Summary (Last 24 hours) at 07/16/2020 0823 Last data filed at 07/16/2020 0600 Gross per 24 hour  Intake 1492.49 ml  Output 3600 ml  Net -2107.51 ml   Filed Weights   07/14/20 1627 07/14/20 2152 07/15/20 2000  Weight: 59 kg 64.9 kg 64.6 kg    Examination:  General exam: Appears comfortable  Respiratory system: decreased breath sounds b/l  Cardiovascular system: S1/S2+. No clicks or rubs   Gastrointestinal system: Abd is soft, NT, ND & hypoactive bowel sounds  Central nervous system: Alert and oriented. Moves all 4 extremities  Psychiatry: Judgement and insight appear normal. Flat mood and affect     Data Reviewed: I have personally reviewed following labs and imaging studies  CBC: Recent Labs  Lab 07/14/20 1655 07/15/20 0507 07/16/20 0156  WBC 5.8 7.4 7.7  NEUTROABS 4.6  --   --   HGB 14.2 12.5 12.7  HCT 42.0 36.8 36.4  MCV 103.4* 104.5* 102.2*  PLT 219 185 190   Basic Metabolic Panel: Recent Labs  Lab 07/14/20 1655 07/15/20 0507 07/15/20 1553 07/15/20 2235 07/16/20  0156  NA 138 139  --   --  138  K 3.6 3.6  --  2.9* 2.9*  CL 98 104  --   --  98  CO2 28 25  --   --  27  GLUCOSE 109* 111*  --   --  138*  BUN 10 10  --   --  11  CREATININE 0.76 0.73  --   --  0.82  CALCIUM 9.3 8.8*  --   --  8.9  MG  --   --  1.7 2.6* 2.5*  PHOS  --   --   --  1.6* 1.9*   GFR: Estimated Creatinine Clearance: 58.9 mL/min (by C-G formula based on SCr of 0.82 mg/dL). Liver Function Tests: Recent Labs  Lab 07/14/20 1655 07/16/20 0156  AST 85* 182*  ALT 19 38  ALKPHOS 75 61  BILITOT 1.5* 1.2  PROT 8.2* 7.4  ALBUMIN 4.3 3.7   No results for input(s): LIPASE, AMYLASE in the last 168 hours. No results for input(s): AMMONIA in the last  168 hours. Coagulation Profile: Recent Labs  Lab 07/14/20 1655 07/15/20 1714  INR 1.0 1.0   Cardiac Enzymes: No results for input(s): CKTOTAL, CKMB, CKMBINDEX, TROPONINI in the last 168 hours. BNP (last 3 results) No results for input(s): PROBNP in the last 8760 hours. HbA1C: No results for input(s): HGBA1C in the last 72 hours. CBG: No results for input(s): GLUCAP in the last 168 hours. Lipid Profile: No results for input(s): CHOL, HDL, LDLCALC, TRIG, CHOLHDL, LDLDIRECT in the last 72 hours. Thyroid Function Tests: Recent Labs    07/14/20 1655  TSH 0.799   Anemia Panel: No results for input(s): VITAMINB12, FOLATE, FERRITIN, TIBC, IRON, RETICCTPCT in the last 72 hours. Sepsis Labs: Recent Labs  Lab 07/14/20 1655 07/15/20 2050  PROCALCITON  --  <0.10  LATICACIDVEN 1.5  --     Recent Results (from the past 240 hour(s))  SARS CORONAVIRUS 2 (TAT 6-24 HRS) Nasopharyngeal Nasopharyngeal Swab     Status: None   Collection Time: 07/14/20  2:10 PM   Specimen: Nasopharyngeal Swab  Result Value Ref Range Status   SARS Coronavirus 2 NEGATIVE NEGATIVE Final    Comment: (NOTE) SARS-CoV-2 target nucleic acids are NOT DETECTED.  The SARS-CoV-2 RNA is generally detectable in upper and lower respiratory specimens during the acute phase of infection. Negative results do not preclude SARS-CoV-2 infection, do not rule out co-infections with other pathogens, and should not be used as the sole basis for treatment or other patient management decisions. Negative results must be combined with clinical observations, patient history, and epidemiological information. The expected result is Negative.  Fact Sheet for Patients: HairSlick.no  Fact Sheet for Healthcare Providers: quierodirigir.com  This test is not yet approved or cleared by the [place] FDA and  has been authorized for detection and/or diagnosis of SARS-CoV-2  by FDA under an Emergency Use Authorization (EUA). This EUA will remain  in effect (meaning this test can be used) for the duration of the COVID-19 declaration under Se ction 564(b)(1) of the Act, 21 U.S.C. section 360bbb-3(b)(1), unless the authorization is terminated or revoked sooner.  Performed at [facility], [address], [place], [place] [address]   Resp Panel by RT-PCR (Flu A&B, Covid) Nasopharyngeal Swab     Status: None   Collection Time: 07/14/20  4:55 PM   Specimen: Nasopharyngeal Swab; Nasopharyngeal(NP) swabs in vial transport medium  Result Value Ref Range Status  SARS Coronavirus 2 by RT PCR NEGATIVE NEGATIVE Final    Comment: (NOTE) SARS-CoV-2 target nucleic acids are NOT DETECTED.  The SARS-CoV-2 RNA is generally detectable in upper respiratory specimens during the acute phase of infection. The lowest concentration of SARS-CoV-2 viral copies this assay can detect is 138 copies/mL. A negative result does not preclude SARS-Cov-2 infection and should not be used as the sole basis for treatment or other patient management decisions. A negative result may occur with  improper specimen collection/handling, submission of specimen other than nasopharyngeal swab, presence of viral mutation(s) within the areas targeted by this assay, and inadequate number of viral copies(<138 copies/mL). A negative result must be combined with clinical observations, patient history, and epidemiological information. The expected result is Negative.  Fact Sheet for Patients:  BloggerCourse.com  Fact Sheet for Healthcare Providers:  SeriousBroker.it  This test is no t yet approved or cleared by the [place] FDA and  has been authorized for detection and/or diagnosis of SARS-CoV-2 by FDA under an Emergency Use Authorization (EUA). This EUA will remain  in effect (meaning this test can be used) for the duration of the COVID-19  declaration under Section 564(b)(1) of the Act, 21 U.S.C.section 360bbb-3(b)(1), unless the authorization is terminated  or revoked sooner.       Influenza A by PCR NEGATIVE NEGATIVE Final   Influenza B by PCR NEGATIVE NEGATIVE Final    Comment: (NOTE) The Xpert Xpress SARS-CoV-2/FLU/RSV plus assay is intended as an aid in the diagnosis of influenza from Nasopharyngeal swab specimens and should not be used as a sole basis for treatment. Nasal washings and aspirates are unacceptable for Xpert Xpress SARS-CoV-2/FLU/RSV testing.  Fact Sheet for Patients: BloggerCourse.com  Fact Sheet for Healthcare Providers: SeriousBroker.it  This test is not yet approved or cleared by the [place] FDA and has been authorized for detection and/or diagnosis of SARS-CoV-2 by FDA under an Emergency Use Authorization (EUA). This EUA will remain in effect (meaning this test can be used) for the duration of the COVID-19 declaration under Section 564(b)(1) of the Act, 21 U.S.C. section 360bbb-3(b)(1), unless the authorization is terminated or revoked.  Performed at [facility], [address] [place], [place] [address]   Blood Culture (routine x 2)     Status: None (Preliminary result)   Collection Time: 07/14/20  4:55 PM   Specimen: BLOOD  Result Value Ref Range Status   Specimen Description BLOOD RIGHT ANTECUBITAL  Final   Special Requests   Final    BOTTLES DRAWN AEROBIC AND ANAEROBIC Blood Culture adequate volume   Culture   Final    NO GROWTH 2 DAYS Performed at [facility], [address] [place], [place] [address]    Report Status PENDING  Incomplete  Blood Culture (routine x 2)     Status: None (Preliminary result)   Collection Time: 07/14/20  4:55 PM   Specimen: BLOOD  Result Value Ref Range Status   Specimen Description BLOOD LEFT ANTECUBITAL  Final   Special Requests   Final    BOTTLES DRAWN AEROBIC AND  ANAEROBIC Blood Culture adequate volume   Culture   Final    NO GROWTH 2 DAYS Performed at [facility], [address] [place], [place] [address]    Report Status PENDING  Incomplete  MRSA PCR Screening     Status: None   Collection Time: 07/15/20  8:20 PM   Specimen: Nasopharyngeal  Result Value Ref Range Status   MRSA  by PCR NEGATIVE NEGATIVE Final    Comment:        The GeneXpert MRSA Assay (FDA approved for NASAL specimens only), is one component of a comprehensive MRSA colonization surveillance program. It is not intended to diagnose MRSA infection nor to guide or monitor treatment for MRSA infections. Performed at [facility], [address], [place], [place] [address]          Radiology Studies: DG Chest 2 View  Result Date: 07/14/2020 CLINICAL DATA:  Cough, shortness of breath EXAM: CHEST - 2 VIEW COMPARISON:  None. FINDINGS: Patchy right lower lobe opacity, suspicious for pneumonia. Suspected small bilateral pleural effusions. No frank interstitial edema. The heart is top-normal in size. Visualized osseous structures are within normal limits. IMPRESSION: Patchy right lower lobe opacity, suspicious for pneumonia. Suspected small bilateral pleural effusions. Electronically Signed   By: Charline Bills M.D.   On: 07/14/2020 14:32   CARDIAC CATHETERIZATION  Result Date: 07/15/2020  Prox RCA lesion is 100% stenosed. CTO  Mid LM lesion is 25% stenosed.  Dist LAD lesion is 95% stenosed.  Prox LAD to Dist LAD lesion is 75% stenosed.  Dist LM to Prox LAD lesion is 50% stenosed.  Prox Cx to Mid Cx lesion is 100% stenosed. In-stent thrombosis of the stent IRA TIMI 0 flow  Moderate collaterals left to right left to left  Left ventricular function severely depressed left ventricular enlargement EF between 25 and 30% globally  Unsuccessful attempted PCI and stent to ostial old stent to the circumflex with in-stent thrombosis failure to cross with a wire   Conclusion STEMI presentation in house late recognition Troponins were greater than 27,000 EKG had diffuse ST elevation inferior laterally at admission and 24 hours later when STEMI was called Diagnostic cardiac cath showed severely depressed left ventricular function ejection fraction was between 25 and 30% with left ventricular enlargement Coronaries Left main large minor distal disease LAD was large diffuse 50 to 75% proximal to mid, diffuse 75 to 95% mid to distal Circumflex is large ostial stent occluded thrombotic in-stent thrombosis from an old stent TIMI 0 flow RCA medium to small a completely occluded proximally CTO Intervention Unsuccessful attempt at PCI and stent of in-stent thrombosis of old proximal stent to circumflex Unable to cross with a wire Patient developed heart failure type symptoms Treated with Lasix Groin was mynx Patient was transferred to ICU for aggressive medical therapy for heart failure and post MI care   DG Chest Port 1 View  Result Date: 07/15/2020 CLINICAL DATA:  Pneumonia.  Shortness of breath.  Cough.  Fever. EXAM: PORTABLE CHEST 1 VIEW COMPARISON:  07/14/2020. FINDINGS: Cardiomegaly with and pulmonary venous congestion. Right base infiltrate most consistent pneumonia. Asymmetric edema could also present this fashion. Small moderate right-sided pleural effusion. No pneumothorax. IMPRESSION: 1. Cardiomegaly with pulmonary venous congestion. 2. Right base infiltrate most consistent with pneumonia. Asymmetric pulmonary edema could also present this fashion. Small moderate right pleural effusion. Electronically Signed   By: Maisie Fus  Register   On: 07/15/2020 12:32        Scheduled Meds: . amLODipine  10 mg Oral Daily  . aspirin  81 mg Oral Daily  . budesonide (PULMICORT) nebulizer solution  0.5 mg Nebulization BID  . cetirizine  10 mg Oral Daily  . hydrALAZINE  25 mg Oral Q8H  . levalbuterol  1.25 mg Nebulization Q6H   And  . ipratropium  0.5 mg Nebulization Q6H  .  losartan  25 mg Oral  BID  . mouth rinse  15 mL Mouth Rinse BID  . methylPREDNISolone (SOLU-MEDROL) injection  40 mg Intravenous Q8H  . metoprolol tartrate  25 mg Oral BID  . nicotine  14 mg Transdermal Daily  . potassium chloride  40 mEq Oral BID  . sodium chloride flush  3 mL Intravenous Q12H   Continuous Infusions: . sodium chloride    . azithromycin 500 mg (07/15/20 1550)  . cefTRIAXone (ROCEPHIN)  IV 2 g (07/15/20 1711)  . heparin 900 Units/hr (07/16/20 0600)     LOS: 1 day    Time spent: 34 mins    Charise Killian, MD [facility] Pager [phone]  If 7PM-7AM, please contact night-coverage 07/16/2020, 8:23 AM

## 2020-07-17 DIAGNOSIS — I472 Ventricular tachycardia: Secondary | ICD-10-CM

## 2020-07-17 DIAGNOSIS — I5023 Acute on chronic systolic (congestive) heart failure: Secondary | ICD-10-CM

## 2020-07-17 DIAGNOSIS — I5021 Acute systolic (congestive) heart failure: Secondary | ICD-10-CM

## 2020-07-17 LAB — COMPREHENSIVE METABOLIC PANEL
ALT: 28 U/L (ref 0–44)
AST: 72 U/L — ABNORMAL HIGH (ref 15–41)
Albumin: 3.3 g/dL — ABNORMAL LOW (ref 3.5–5.0)
Alkaline Phosphatase: 49 U/L (ref 38–126)
Anion gap: 11 (ref 5–15)
BUN: 24 mg/dL — ABNORMAL HIGH (ref 8–23)
CO2: 24 mmol/L (ref 22–32)
Calcium: 8.8 mg/dL — ABNORMAL LOW (ref 8.9–10.3)
Chloride: 103 mmol/L (ref 98–111)
Creatinine, Ser: 1.02 mg/dL — ABNORMAL HIGH (ref 0.44–1.00)
GFR, Estimated: >60 mL/min (ref >60)
Glucose, Bld: 145 mg/dL — ABNORMAL HIGH (ref 70–99)
Potassium: 3.8 mmol/L (ref 3.5–5.1)
Sodium: 138 mmol/L (ref 135–145)
Total Bilirubin: 0.7 mg/dL (ref 0.3–1.2)
Total Protein: 7 g/dL (ref 6.5–8.1)

## 2020-07-17 LAB — CBC
HCT: 35.6 % — ABNORMAL LOW (ref 36.0–46.0)
Hemoglobin: 12.3 g/dL (ref 12.0–15.0)
MCH: 35.7 pg — ABNORMAL HIGH (ref 26.0–34.0)
MCHC: 34.6 g/dL (ref 30.0–36.0)
MCV: 103.2 fL — ABNORMAL HIGH (ref 80.0–100.0)
Platelets: 198 10*3/uL (ref 150–400)
RBC: 3.45 MIL/uL — ABNORMAL LOW (ref 3.87–5.11)
RDW: 13.5 % (ref 11.5–15.5)
WBC: 12.2 10*3/uL — ABNORMAL HIGH (ref 4.0–10.5)
nRBC: 0 % (ref 0.0–0.2)

## 2020-07-17 LAB — HEPARIN LEVEL (UNFRACTIONATED): Heparin Unfractionated: 0.59 IU/mL (ref 0.30–0.70)

## 2020-07-17 LAB — PHOSPHORUS: Phosphorus: 3.5 mg/dL (ref 2.5–4.6)

## 2020-07-17 LAB — MAGNESIUM: Magnesium: 2.4 mg/dL (ref 1.7–2.4)

## 2020-07-17 MED ORDER — METOPROLOL SUCCINATE ER 50 MG PO TB24: 50.0000 mg | ORAL_TABLET | Freq: Every day | ORAL | Status: DC

## 2020-07-17 MED ORDER — CLOPIDOGREL BISULFATE 75 MG PO TABS
75.0000 mg | ORAL_TABLET | Freq: Every day | ORAL | Status: DC
  Administered 2020-07-18 – 2020-07-20 (×3): 75 mg via ORAL
  Filled 2020-07-17 (×3): qty 1

## 2020-07-17 MED ORDER — POTASSIUM CHLORIDE CRYS ER 20 MEQ PO TBCR
40.0000 meq | EXTENDED_RELEASE_TABLET | Freq: Once | ORAL | Status: AC
  Administered 2020-07-17: 40 meq via ORAL
  Filled 2020-07-17: qty 2

## 2020-07-17 MED ORDER — LOSARTAN POTASSIUM 25 MG PO TABS
25.0000 mg | ORAL_TABLET | Freq: Every day | ORAL | Status: DC
  Administered 2020-07-17 – 2020-07-20 (×4): 25 mg via ORAL
  Filled 2020-07-17 (×4): qty 1

## 2020-07-17 MED ORDER — METOPROLOL SUCCINATE ER 25 MG PO TB24
25.0000 mg | ORAL_TABLET | Freq: Every day | ORAL | Status: DC
  Administered 2020-07-17 – 2020-07-20 (×4): 25 mg via ORAL
  Filled 2020-07-17 (×4): qty 1

## 2020-07-17 MED ORDER — CLOPIDOGREL BISULFATE 75 MG PO TABS
300.0000 mg | ORAL_TABLET | Freq: Once | ORAL | Status: AC
  Administered 2020-07-17: 300 mg via ORAL
  Filled 2020-07-17: qty 4

## 2020-07-17 MED ORDER — METHYLPREDNISOLONE SODIUM SUCC 40 MG IJ SOLR
40.0000 mg | Freq: Every day | INTRAMUSCULAR | Status: DC
  Administered 2020-07-18: 40 mg via INTRAVENOUS
  Filled 2020-07-17: qty 1

## 2020-07-17 NOTE — Consult Note (Signed)
ANTICOAGULATION CONSULT NOTE  Pharmacy Consult for Heparin Infusion Indication: STEMI  Patient Measurements: Height: 5' (152.4 cm) Weight: 64.6 kg (142 lb 6.7 oz) IBW/kg (Calculated) : 45.5  Vital Signs: Temp: 98.5 F (36.9 C) (03/10 2000) Temp Source: Oral (03/10 2000) BP: 98/61 (03/11 0200) Pulse Rate: 84 (03/11 0200)  Labs: Recent Labs    07/14/20 1655 07/15/20 0507 07/15/20 1553 07/15/20 1714 07/15/20 1717 07/16/20 0156 07/16/20 1149 07/16/20 2047 07/17/20 0341  HGB 14.2 12.5  --   --   --  12.7  --   --  12.3  HCT 42.0 36.8  --   --   --  36.4  --   --  35.6*  PLT 219 185  --   --   --  190  --   --  198  APTT 29  --   --  30  --   --   --   --   --   LABPROT 12.8  --   --  13.1  --   --   --   --   --   INR 1.0  --   --  1.0  --   --   --   --   --   HEPARINUNFRC  --   --   --   --    < > 0.21* 0.17* 0.63 0.59  CREATININE 0.76 0.73  --   --   --  0.82 0.96  --  1.02*  TROPONINIHS  --   --  >27,000* 60,109*  --   --  16,118*  --   --    < > = values in this interval not displayed.    Estimated Creatinine Clearance: 47.3 mL/min (A) (by C-G formula based on SCr of 1.02 mg/dL (H)).   Medical History: History reviewed. No pertinent past medical history.  Assessment: Pharmacy has been consulted to initiate Heparin Infusion on 65yo patient with ST elevation in anterior lateral leads. Troponin level over 27,000 was found and code STEMI was called by hospitalist. Patient was brought to the Cardiac Cath lab with intention to treat but attempted intervention was unsuccessful.  Patient has no history of PTA anticoagulation use.    Goal of Therapy:  Heparin level 0.3-0.7 units/ml Monitor platelets by anticoagulation protocol: Yes   3/10 2047 HL 0.63 therapeutic x 1 3/11 0341 HL 0.59, therapeutic x 2   Plan:  HL therapeutic. Continue heparin at 1150 units/hr.  HL and CBC daily while on heparin.  Continue to monitor H&H and platelets  Otelia Sergeant, PharmD,  [facility] 07/17/2020 5:05 AM

## 2020-07-17 NOTE — Progress Notes (Signed)
PROGRESS NOTE    Elizabeth Herring  QIH:[id number] DOB: 1955-07-14 DOA: 07/14/2020 PCP: Patient, No Pcp Per    Assessment & Plan:   Active Problems:   PNA (pneumonia)   Bronchitis due to tobacco use   Essential hypertension   Pneumonia  STEMI: w/ ST elevation in anterior lateral leads. Troponins >27,000 but trending down. Cardiac cath was attempted but was unsuccessful & unable to cross w/ a wire. Continue on IV heparin for 48-72 hours & continue on metoprolol, losartan. Started on aspirin & plavix. Continue w/ medical therapy as per cardio. Echo shows EF <20%, LV shows global hypokinesis & indeterminate diastolic function. Continue on tele. Hx of cardiac stent approx 10 years ago   Pneumonia: continue on IV ceftriaxone, azithromycin. Start steroid taper.  Continue on bronchodilators and incentive spirometry. Possible COPD, will need outpatient PFTs  Acute systolic CHF exacerbation: EF <87% on echo. Continue on IV lasix. Monitor I/Os. Continue on metoprolol, losartan. Will get a life vest prior to d/c. Continue on tele. Cardio following and recs   Leukocytosis: likely secondary to steroid use.  Hypokalemia: WNL today   Hypophosphatemia:WNL today   Pulmonary edema: continue on IV lasix. Monitor I/Os   Tobacco abuse: smoking cessation counseling. Nicotine patch to prevent w/drawal   HTN: continue on metoprolol, losartan     DVT prophylaxis: lovenox  Code Status: full  Family Communication: Disposition Plan: likely d/c back home   Level of care: Stepdown   Status is: Inpatient  Remains inpatient appropriate because:Ongoing diagnostic testing needed not appropriate for outpatient work up, IV treatments appropriate due to intensity of illness or inability to take PO and Inpatient level of care appropriate due to severity of illness   Dispo: The patient is from: Home              Anticipated d/c is to: Home              Patient currently is not medically stable to d/c.    Difficult to place patient Yes    Consultants:   Cardio    Procedures:    Antimicrobials: azithromycin, ceftriaxone    Subjective: Pt c/o fatigue  Objective: Vitals:   07/17/20 0000 07/17/20 0100 07/17/20 0200 07/17/20 0550  BP: 97/65 117/76 98/61 132/81  Pulse: 90 83 84   Resp: 20 (!) 27 20   Temp:      TempSrc:      SpO2: 95% 95% 92%   Weight:      Height:        Intake/Output Summary (Last 24 hours) at 07/17/2020 0824 Last data filed at 07/16/2020 1000 Gross per 24 hour  Intake 186.71 ml  Output --  Net 186.71 ml   Filed Weights   07/14/20 1627 07/14/20 2152 07/15/20 2000  Weight: 59 kg 64.9 kg 64.6 kg    Examination:  General exam: Appears calm & comfortable Respiratory system: diminished breath sounds b/l Cardiovascular system: S1/S2+. No rubs or gallops  Gastrointestinal system: Abd is soft, NT, ND & normal bowel sounds  Central nervous system: Alert and oriented. Moves all 4 extremities  Psychiatry: Judgement and insight appear normal. Flat mood and affect    Data Reviewed: I have personally reviewed following labs and imaging studies  CBC: Recent Labs  Lab 07/14/20 1655 07/15/20 0507 07/16/20 0156 07/17/20 0341  WBC 5.8 7.4 7.7 12.2*  NEUTROABS 4.6  --   --   --   HGB 14.2 12.5 12.7 12.3  HCT 42.0 36.8  36.4 35.6*  MCV 103.4* 104.5* 102.2* 103.2*  PLT 219 185 190 198   Basic Metabolic Panel: Recent Labs  Lab 07/14/20 1655 07/15/20 0507 07/15/20 1553 07/15/20 2235 07/16/20 0156 07/16/20 1149 07/17/20 0341  NA 138 139  --   --  138 136 138  K 3.6 3.6  --  2.9* 2.9* 3.9 3.8  CL 98 104  --   --  98 99 103  CO2 28 25  --   --  GLUCOSE 109* 111*  --   --  138* 177* 145*  BUN 10 10  --   --  11 17 24*  CREATININE 0.76 0.73  --   --  0.82 0.96 1.02*  CALCIUM 9.3 8.8*  --   --  8.9 8.8* 8.8*  MG  --   --  1.7 2.6* 2.5* 2.5* 2.4  PHOS  --   --   --  1.6* 1.9* 4.7* 3.5   GFR: Estimated Creatinine Clearance: 47.3 mL/min  (A) (by C-G formula based on SCr of 1.02 mg/dL (H)). Liver Function Tests: Recent Labs  Lab 07/14/20 1655 07/16/20 0156 07/17/20 0341  AST 85* 182* 72*  ALT 19 38 28  ALKPHOS 75 61 49  BILITOT 1.5* 1.2 0.7  PROT 8.2* 7.4 7.0  ALBUMIN 4.3 3.7 3.3*   No results for input(s): LIPASE, AMYLASE in the last 168 hours. No results for input(s): AMMONIA in the last 168 hours. Coagulation Profile: Recent Labs  Lab 07/14/20 1655 07/15/20 1714  INR 1.0 1.0   Cardiac Enzymes: No results for input(s): CKTOTAL, CKMB, CKMBINDEX, TROPONINI in the last 168 hours. BNP (last 3 results) No results for input(s): PROBNP in the last 8760 hours. HbA1C: Recent Labs    07/16/20 0156  HGBA1C 5.6   CBG: Recent Labs  Lab 07/15/20 1935  GLUCAP 186*   Lipid Profile: Recent Labs    07/16/20 0156  CHOL 246*  HDL 93  LDLCALC 143*  TRIG 50  CHOLHDL 2.6   Thyroid Function Tests: Recent Labs    07/14/20 1655  TSH 0.799   Anemia Panel: No results for input(s): VITAMINB12, FOLATE, FERRITIN, TIBC, IRON, RETICCTPCT in the last 72 hours. Sepsis Labs: Recent Labs  Lab 07/14/20 1655 07/15/20 2050 07/16/20 1149  PROCALCITON  --  <0.10 0.14  LATICACIDVEN 1.5  --   --     Recent Results (from the past 240 hour(s))  SARS CORONAVIRUS 2 (TAT 6-24 HRS) Nasopharyngeal Nasopharyngeal Swab     Status: None   Collection Time: 07/14/20  2:10 PM   Specimen: Nasopharyngeal Swab  Result Value Ref Range Status   SARS Coronavirus 2 NEGATIVE NEGATIVE Final    Comment: (NOTE) SARS-CoV-2 target nucleic acids are NOT DETECTED.  The SARS-CoV-2 RNA is generally detectable in upper and lower respiratory specimens during the acute phase of infection. Negative results do not preclude SARS-CoV-2 infection, do not rule out co-infections with other pathogens, and should not be used as the sole basis for treatment or other patient management decisions. Negative results must be combined with clinical  observations, patient history, and epidemiological information. The expected result is Negative.  Fact Sheet for Patients: HairSlick.no  Fact Sheet for Healthcare Providers: quierodirigir.com  This test is not yet approved or cleared by the [place] FDA and  has been authorized for detection and/or diagnosis of SARS-CoV-2 by FDA under an Emergency Use Authorization (EUA). This EUA will remain  in effect (meaning this test can  be used) for the duration of the COVID-19 declaration under Se ction 564(b)(1) of the Act, 21 U.S.C. section 360bbb-3(b)(1), unless the authorization is terminated or revoked sooner.  Performed at [facility], [address], [place], [place] [address]   Resp Panel by RT-PCR (Flu A&B, Covid) Nasopharyngeal Swab     Status: None   Collection Time: 07/14/20  4:55 PM   Specimen: Nasopharyngeal Swab; Nasopharyngeal(NP) swabs in vial transport medium  Result Value Ref Range Status   SARS Coronavirus 2 by RT PCR NEGATIVE NEGATIVE Final    Comment: (NOTE) SARS-CoV-2 target nucleic acids are NOT DETECTED.  The SARS-CoV-2 RNA is generally detectable in upper respiratory specimens during the acute phase of infection. The lowest concentration of SARS-CoV-2 viral copies this assay can detect is 138 copies/mL. A negative result does not preclude SARS-Cov-2 infection and should not be used as the sole basis for treatment or other patient management decisions. A negative result may occur with  improper specimen collection/handling, submission of specimen other than nasopharyngeal swab, presence of viral mutation(s) within the areas targeted by this assay, and inadequate number of viral copies(<138 copies/mL). A negative result must be combined with clinical observations, patient history, and epidemiological information. The expected result is Negative.  Fact Sheet for Patients:   BloggerCourse.com  Fact Sheet for Healthcare Providers:  SeriousBroker.it  This test is no t yet approved or cleared by the [place] FDA and  has been authorized for detection and/or diagnosis of SARS-CoV-2 by FDA under an Emergency Use Authorization (EUA). This EUA will remain  in effect (meaning this test can be used) for the duration of the COVID-19 declaration under Section 564(b)(1) of the Act, 21 U.S.C.section 360bbb-3(b)(1), unless the authorization is terminated  or revoked sooner.       Influenza A by PCR NEGATIVE NEGATIVE Final   Influenza B by PCR NEGATIVE NEGATIVE Final    Comment: (NOTE) The Xpert Xpress SARS-CoV-2/FLU/RSV plus assay is intended as an aid in the diagnosis of influenza from Nasopharyngeal swab specimens and should not be used as a sole basis for treatment. Nasal washings and aspirates are unacceptable for Xpert Xpress SARS-CoV-2/FLU/RSV testing.  Fact Sheet for Patients: BloggerCourse.com  Fact Sheet for Healthcare Providers: SeriousBroker.it  This test is not yet approved or cleared by the [place] FDA and has been authorized for detection and/or diagnosis of SARS-CoV-2 by FDA under an Emergency Use Authorization (EUA). This EUA will remain in effect (meaning this test can be used) for the duration of the COVID-19 declaration under Section 564(b)(1) of the Act, 21 U.S.C. section 360bbb-3(b)(1), unless the authorization is terminated or revoked.  Performed at [facility], [address] [place], [place] [address]   Blood Culture (routine x 2)     Status: None (Preliminary result)   Collection Time: 07/14/20  4:55 PM   Specimen: BLOOD  Result Value Ref Range Status   Specimen Description BLOOD RIGHT ANTECUBITAL  Final   Special Requests   Final    BOTTLES DRAWN AEROBIC AND ANAEROBIC Blood Culture adequate volume   Culture    Final    NO GROWTH 3 DAYS Performed at [facility], [address] [place], [place] [address]    Report Status PENDING  Incomplete  Blood Culture (routine x 2)     Status: None (Preliminary result)   Collection Time: 07/14/20  4:55 PM   Specimen: BLOOD  Result Value Ref Range Status   Specimen Description BLOOD LEFT  ANTECUBITAL  Final   Special Requests   Final    BOTTLES DRAWN AEROBIC AND ANAEROBIC Blood Culture adequate volume   Culture   Final    NO GROWTH 3 DAYS Performed at [facility], [address] [place], [place] [address]    Report Status PENDING  Incomplete  MRSA PCR Screening     Status: None   Collection Time: 07/15/20  8:20 PM   Specimen: Nasopharyngeal  Result Value Ref Range Status   MRSA by PCR NEGATIVE NEGATIVE Final    Comment:        The GeneXpert MRSA Assay (FDA approved for NASAL specimens only), is one component of a comprehensive MRSA colonization surveillance program. It is not intended to diagnose MRSA infection nor to guide or monitor treatment for MRSA infections. Performed at [facility] [address] [place], [place] [address]          Radiology Studies: CARDIAC CATHETERIZATION  Result Date: 07/15/2020  Prox RCA lesion is 100% stenosed. CTO  Mid LM lesion is 25% stenosed.  Dist LAD lesion is 95% stenosed.  Prox LAD to Dist LAD lesion is 75% stenosed.  Dist LM to Prox LAD lesion is 50% stenosed.  Prox Cx to Mid Cx lesion is 100% stenosed. In-stent thrombosis of the stent IRA TIMI 0 flow  Moderate collaterals left to right left to left  Left ventricular function severely depressed left ventricular enlargement EF between 25 and 30% globally  Unsuccessful attempted PCI and stent to ostial old stent to the circumflex with in-stent thrombosis failure to cross with a wire  Conclusion STEMI presentation in house late recognition Troponins were greater than 27,000 EKG had diffuse ST elevation inferior  laterally at admission and 24 hours later when STEMI was called Diagnostic cardiac cath showed severely depressed left ventricular function ejection fraction was between 25 and 30% with left ventricular enlargement Coronaries Left main large minor distal disease LAD was large diffuse 50 to 75% proximal to mid, diffuse 75 to 95% mid to distal Circumflex is large ostial stent occluded thrombotic in-stent thrombosis from an old stent TIMI 0 flow RCA medium to small a completely occluded proximally CTO Intervention Unsuccessful attempt at PCI and stent of in-stent thrombosis of old proximal stent to circumflex Unable to cross with a wire Patient developed heart failure type symptoms Treated with Lasix Groin was mynx Patient was transferred to ICU for aggressive medical therapy for heart failure and post MI care   DG Chest Port 1 View  Result Date: 07/15/2020 CLINICAL DATA:  Pneumonia.  Shortness of breath.  Cough.  Fever. EXAM: PORTABLE CHEST 1 VIEW COMPARISON:  07/14/2020. FINDINGS: Cardiomegaly with and pulmonary venous congestion. Right base infiltrate most consistent pneumonia. Asymmetric edema could also present this fashion. Small moderate right-sided pleural effusion. No pneumothorax. IMPRESSION: 1. Cardiomegaly with pulmonary venous congestion. 2. Right base infiltrate most consistent with pneumonia. Asymmetric pulmonary edema could also present this fashion. Small moderate right pleural effusion. Electronically Signed   By: Maisie Fus  Register   On: 07/15/2020 12:32   ECHOCARDIOGRAM COMPLETE  Result Date: 07/16/2020    ECHOCARDIOGRAM REPORT   Patient Name:   MALI EPPARD Date of Exam: 07/16/2020 Medical Rec #:  [id number]             Height:       60.0 in Accession #:    [id number]            Weight:       142.4  lb Date of Birth:  1955/08/15             BSA:          1.616 m Patient Age:    64 years              BP:           118/79 mmHg Patient Gender: F                     HR:           81 bpm.  Exam Location:  [facility] Procedure: 2D Echo, Cardiac Doppler, Color Doppler and Strain Analysis Indications:     R94.31 Abnormal ECG  History:         Patient has no prior history of Echocardiogram examinations.                  CAD.  Sonographer:     Humphrey RollsJoan Heiss RDCS (AE) Referring Phys:  [id number] Debbe OdeaBRIAN AGBOR-ETANG Diagnosing Phys: Julien Nordmannimothy Gollan MD  Sonographer Comments: Global longitudinal strain was attempted. IMPRESSIONS  1. Left ventricular ejection fraction, by estimation, is <20%. Left ventricular ejection fraction by PLAX is 6 %. The left ventricle has severely decreased function. The left ventricle demonstrates global hypokinesis. The left ventricular internal cavity size was severely dilated. Left ventricular diastolic parameters are indeterminate.  2. Right ventricular systolic function is normal. The right ventricular size is normal.  3. Left atrial size was mildly dilated.  4. Mild mitral valve regurgitation.  5. The inferior vena cava is normal in size with <50% respiratory variability, suggesting right atrial pressure of 8 mmHg. FINDINGS  Left Ventricle: Left ventricular ejection fraction, by estimation, is <20%. Left ventricular ejection fraction by PLAX is 6 %. The left ventricle has severely decreased function. The left ventricle demonstrates global hypokinesis. The left ventricular internal cavity size was severely dilated. There is no left ventricular hypertrophy. Left ventricular diastolic parameters are indeterminate. Right Ventricle: The right ventricular size is normal. No increase in right ventricular wall thickness. Right ventricular systolic function is normal. Left Atrium: Left atrial size was mildly dilated. Right Atrium: Right atrial size was normal in size. Pericardium: There is no evidence of pericardial effusion. Mitral Valve: The mitral valve is normal in structure. Mild mitral valve regurgitation. No evidence of mitral valve stenosis. MV peak gradient, 14.1 mmHg. The mean mitral valve  gradient is 8.0 mmHg. Tricuspid Valve: The tricuspid valve is normal in structure. Tricuspid valve regurgitation is mild . No evidence of tricuspid stenosis. Aortic Valve: The aortic valve is normal in structure. Aortic valve regurgitation is not visualized. No aortic stenosis is present. Aortic valve mean gradient measures 4.0 mmHg. Aortic valve peak gradient measures 6.4 mmHg. Aortic valve area, by VTI measures 1.26 cm. Pulmonic Valve: The pulmonic valve was normal in structure. Pulmonic valve regurgitation is mild. No evidence of pulmonic stenosis. Aorta: The aortic root is normal in size and structure. Venous: The inferior vena cava is normal in size with less than 50% respiratory variability, suggesting right atrial pressure of 8 mmHg. IAS/Shunts: No atrial level shunt detected by color flow Doppler.  LEFT VENTRICLE PLAX 2D LV EF:         Left            Diastology                ventricular     LV e' medial:   8.49 cm/s  ejection        LV E/e' medial: 18.7                fraction by                PLAX is 6 %. LVIDd:         6.90 cm LVIDs:         6.70 cm LV PW:         0.90 cm LV IVS:        0.70 cm LVOT diam:     1.80 cm LV SV:         27 LV SV Index:   17 LVOT Area:     2.54 cm  LV Volumes (MOD) LV vol d, MOD    172.0 ml A2C: LV vol d, MOD    146.0 ml A4C: LV vol s, MOD    136.0 ml A2C: LV vol s, MOD    127.0 ml A4C: LV SV MOD A2C:   36.0 ml LV SV MOD A4C:   146.0 ml LV SV MOD BP:    34.5 ml RIGHT VENTRICLE RV Basal diam:  2.70 cm LEFT ATRIUM             Index       RIGHT ATRIUM          Index LA diam:        4.10 cm 2.54 cm/m  RA Area:     8.98 cm LA Vol (A2C):   75.4 ml 46.67 ml/m RA Volume:   17.90 ml 11.08 ml/m LA Vol (A4C):   54.0 ml 33.42 ml/m LA Biplane Vol: 66.0 ml 40.85 ml/m  AORTIC VALVE                   PULMONIC VALVE AV Area (Vmax):    1.62 cm    PV Vmax:       1.03 m/s AV Area (Vmean):   1.34 cm    PV Vmean:      68.900 cm/s AV Area (VTI):     1.26 cm    PV VTI:         0.185 m AV Vmax:           126.00 cm/s PV Peak grad:  4.2 mmHg AV Vmean:          90.200 cm/s PV Mean grad:  2.0 mmHg AV VTI:            0.216 m AV Peak Grad:      6.4 mmHg AV Mean Grad:      4.0 mmHg LVOT Vmax:         80.30 cm/s LVOT Vmean:        47.400 cm/s LVOT VTI:          0.107 m LVOT/AV VTI ratio: 0.50  AORTA Ao Root diam: 2.90 cm MITRAL VALVE MV Area (PHT): 4.33 cm     SHUNTS MV Area VTI:   0.74 cm     Systemic VTI:  0.11 m MV Peak grad:  14.1 mmHg    Systemic Diam: 1.80 cm MV Mean grad:  8.0 mmHg MV Vmax:       1.88 m/s MV Vmean:      133.0 cm/s MV Decel Time: 175 msec MV E velocity: 159.00 cm/s MV A velocity: 172.00 cm/s MV E/A ratio:  0.92 Julien Nordmann MD Electronically signed by Julien Nordmann MD Signature Date/Time: 07/16/2020/5:46:05 PM  Final         Scheduled Meds: . aspirin  81 mg Oral Daily  . atorvastatin  80 mg Oral Daily  . budesonide (PULMICORT) nebulizer solution  0.5 mg Nebulization BID  . cetirizine  10 mg Oral Daily  . furosemide  20 mg Intravenous Daily  . hydrALAZINE  25 mg Oral Q8H  . levalbuterol  1.25 mg Nebulization Q6H   And  . ipratropium  0.5 mg Nebulization Q6H  . losartan  12.5 mg Oral Daily  . mouth rinse  15 mL Mouth Rinse BID  . methylPREDNISolone (SOLU-MEDROL) injection  40 mg Intravenous Q8H  . metoprolol tartrate  25 mg Oral BID  . nicotine  14 mg Transdermal Daily  . sodium chloride flush  3 mL Intravenous Q12H   Continuous Infusions: . sodium chloride    . azithromycin 500 mg (07/16/20 1816)  . cefTRIAXone (ROCEPHIN)  IV 2 g (07/16/20 1743)  . heparin 1,150 Units/hr (07/16/20 1434)     LOS: 2 days    Time spent: 30 mins    Charise Killian, MD [facility] Pager [phone]  If 7PM-7AM, please contact night-coverage 07/17/2020, 8:24 AM

## 2020-07-17 NOTE — Progress Notes (Signed)
   [employer] representative contacted and aware of order placed for vest today 07/17/20 for vest at discharge.  Signed, Lennon Alstrom, PA-C 07/17/2020, 9:33 AM

## 2020-07-17 NOTE — TOC Progression Note (Signed)
Transition of Care [facility]) - Progression Note    Patient Details  Name: Elizabeth Herring MRN: [id number] Date of Birth: 09-Nov-1955  [facility] of [facility]) CM/SW Contact  Marina Goodell Phone Number:  [phone] 07/17/2020, 11:49 AM  Clinical Narrative:     CSW faxed information to [employer] [phone], H&P, Facesheet, PA's note, Echo results.       Expected Discharge Plan and Services                                                 Social Determinants of Health (SDOH) Interventions    Readmission Risk Interventions No flowsheet data found.

## 2020-07-17 NOTE — Progress Notes (Signed)
PHARMACY CONSULT NOTE  Pharmacy Consult for Electrolyte Monitoring and Replacement   Recent Labs: Potassium (mmol/L)  Date Value  07/17/2020 3.8   Magnesium (mg/dL)  Date Value  67/05/4101 2.4   Calcium (mg/dL)  Date Value  06/08/4386 8.8 (L)   Albumin (g/dL)  Date Value  87/57/9728 3.3 (L)   Phosphorus (mg/dL)  Date Value  20/60/1561 3.5   Sodium (mmol/L)  Date Value  07/17/2020 138   Corrected Ca: 9.36 mg/dL  Assessment: 65 yo F presented with SOB and cough. CXR with RLL pneumonia. Pt with STEMI 3/9, PCI unsuccessful. . Pharmacy has been consulted to manage electrolytes.   Diuretics: furosemide 20 mg IV once daily  Goal of Therapy:  Potassium 4.0 - 5.1 mmol/L Magnesium 2.0 - 2.4 mg/dL All Other Electrolytes WNL  Plan:   KCl 40 mEq PO x 1 per Dr End  monitor electrolytes with AM labs  Lowella Bandy, PharmD 07/17/2020 8:52 AM

## 2020-07-17 NOTE — Progress Notes (Signed)
Progress Note  Patient Name: Elizabeth Herring Date of Encounter: 07/17/2020  [facility] Cardiologist: New - Gollan  Subjective   Patient feels well, almost back to baseline.  She denies chest pain, shortness of breath, palpitations, and edema.  Inpatient Medications    Scheduled Meds: . aspirin  81 mg Oral Daily  . atorvastatin  80 mg Oral Daily  . budesonide (PULMICORT) nebulizer solution  0.5 mg Nebulization BID  . cetirizine  10 mg Oral Daily  . furosemide  20 mg Intravenous Daily  . hydrALAZINE  25 mg Oral Q8H  . levalbuterol  1.25 mg Nebulization Q6H   And  . ipratropium  0.5 mg Nebulization Q6H  . losartan  12.5 mg Oral Daily  . mouth rinse  15 mL Mouth Rinse BID  . methylPREDNISolone (SOLU-MEDROL) injection  40 mg Intravenous Q8H  . metoprolol tartrate  25 mg Oral BID  . nicotine  14 mg Transdermal Daily  . sodium chloride flush  3 mL Intravenous Q12H   Continuous Infusions: . sodium chloride    . azithromycin 500 mg (07/16/20 1816)  . cefTRIAXone (ROCEPHIN)  IV 2 g (07/16/20 1743)  . heparin 1,150 Units/hr (07/16/20 1434)   PRN Meds: sodium chloride, acetaminophen, ALPRAZolam, morphine injection, nitroGLYCERIN, ondansetron (ZOFRAN) IV, ondansetron **OR** [DISCONTINUED] ondansetron (ZOFRAN) IV, sodium chloride flush   Vital Signs    Vitals:   07/17/20 0000 07/17/20 0100 07/17/20 0200 07/17/20 0550  BP: 97/65 117/76 98/61 132/81  Pulse: 90 83 84   Resp: 20 (!) 27 20   Temp:      TempSrc:      SpO2: 95% 95% 92%   Weight:      Height:        Intake/Output Summary (Last 24 hours) at 07/17/2020 0903 Last data filed at 07/16/2020 1000 Gross per 24 hour  Intake 186.71 ml  Output --  Net 186.71 ml   Last 3 Weights 07/15/2020 07/14/2020 07/14/2020  Weight (lbs) 142 lb 6.7 oz 143 lb 1.3 oz 130 lb  Weight (kg) 64.6 kg 64.9 kg 58.968 kg      Telemetry    NSR with 1 run of NSVT (19 beats) - Personally Reviewed  ECG    No new tracing.  Physical  Exam   GEN: No acute distress.   Neck: JVP ~6-8 cm with positive HJR. Cardiac: RRR, no murmurs, rubs, or gallops.  Respiratory: Mildly diminished breath sounds at the lung bases. GI: Soft, nontender, non-distended  MS: No edema; No deformity. Neuro:  Nonfocal  Psych: Normal affect   Labs    High Sensitivity Troponin:   Recent Labs  Lab 07/15/20 1553 07/15/20 1714 07/16/20 1149  TROPONINIHS >27,000* 25,842* 16,118*      Chemistry Recent Labs  Lab 07/14/20 1655 07/15/20 0507 07/16/20 0156 07/16/20 1149 07/17/20 0341  NA 138   < > 138 136 138  K 3.6   < > 2.9* 3.9 3.8  CL 98   < > 98 99 103  CO2 28   < > 27 23 24   GLUCOSE 109*   < > 138* 177* 145*  BUN 10   < > 11 17 24*  CREATININE 0.76   < > 0.82 0.96 1.02*  CALCIUM 9.3   < > 8.9 8.8* 8.8*  PROT 8.2*  --  7.4  --  7.0  ALBUMIN 4.3  --  3.7  --  3.3*  AST 85*  --  182*  --  72*  ALT  19  --  38  --  28  ALKPHOS 75  --  61  --  49  BILITOT 1.5*  --  1.2  --  0.7  GFRNONAA >60   < > >60 >60 >60  ANIONGAP 12   < > 13 14 11    < > = values in this interval not displayed.     Hematology Recent Labs  Lab 07/15/20 0507 07/16/20 0156 07/17/20 0341  WBC 7.4 7.7 12.2*  RBC 3.52* 3.56* 3.45*  HGB 12.5 12.7 12.3  HCT 36.8 36.4 35.6*  MCV 104.5* 102.2* 103.2*  MCH 35.5* 35.7* 35.7*  MCHC 34.0 34.9 34.6  RDW 13.0 13.0 13.5  PLT 185 190 198    BNP Recent Labs  Lab 07/15/20 0507 07/16/20 1925  BNP 1,437.7* 767.9*     DDimer No results for input(s): DDIMER in the last 168 hours.   Radiology    CARDIAC CATHETERIZATION  Result Date: 07/15/2020  Prox RCA lesion is 100% stenosed. CTO  Mid LM lesion is 25% stenosed.  Dist LAD lesion is 95% stenosed.  Prox LAD to Dist LAD lesion is 75% stenosed.  Dist LM to Prox LAD lesion is 50% stenosed.  Prox Cx to Mid Cx lesion is 100% stenosed. In-stent thrombosis of the stent IRA TIMI 0 flow  Moderate collaterals left to right left to left  Left ventricular function  severely depressed left ventricular enlargement EF between 25 and 30% globally  Unsuccessful attempted PCI and stent to ostial old stent to the circumflex with in-stent thrombosis failure to cross with a wire  Conclusion STEMI presentation in house late recognition Troponins were greater than 27,000 EKG had diffuse ST elevation inferior laterally at admission and 24 hours later when STEMI was called Diagnostic cardiac cath showed severely depressed left ventricular function ejection fraction was between 25 and 30% with left ventricular enlargement Coronaries Left main large minor distal disease LAD was large diffuse 50 to 75% proximal to mid, diffuse 75 to 95% mid to distal Circumflex is large ostial stent occluded thrombotic in-stent thrombosis from an old stent TIMI 0 flow RCA medium to small a completely occluded proximally CTO Intervention Unsuccessful attempt at PCI and stent of in-stent thrombosis of old proximal stent to circumflex Unable to cross with a wire Patient developed heart failure type symptoms Treated with Lasix Groin was mynx Patient was transferred to ICU for aggressive medical therapy for heart failure and post MI care   DG Chest Port 1 View  Result Date: 07/15/2020 CLINICAL DATA:  Pneumonia.  Shortness of breath.  Cough.  Fever. EXAM: PORTABLE CHEST 1 VIEW COMPARISON:  07/14/2020. FINDINGS: Cardiomegaly with and pulmonary venous congestion. Right base infiltrate most consistent pneumonia. Asymmetric edema could also present this fashion. Small moderate right-sided pleural effusion. No pneumothorax. IMPRESSION: 1. Cardiomegaly with pulmonary venous congestion. 2. Right base infiltrate most consistent with pneumonia. Asymmetric pulmonary edema could also present this fashion. Small moderate right pleural effusion. Electronically Signed   By: 09/13/2020  Register   On: 07/15/2020 12:32   ECHOCARDIOGRAM COMPLETE  Result Date: 07/16/2020    ECHOCARDIOGRAM REPORT   Patient Name:   Elizabeth Herring Date of Exam: 07/16/2020 Medical Rec #:  09/15/2020             Height:       60.0 in Accession #:    [id number]            Weight:       142.4 lb Date  of Birth:  [birth year]             BSA:          1.616 m Patient Age:    64 years              BP:           118/79 mmHg Patient Gender: F                     HR:           81 bpm. Exam Location:  [facility] Procedure: 2D Echo, Cardiac Doppler, Color Doppler and Strain Analysis Indications:     R94.31 Abnormal ECG  History:         Patient has no prior history of Echocardiogram examinations.                  CAD.  Sonographer:     Humphrey Rolls RDCS (AE) Referring Phys:  [id number] Debbe Odea Diagnosing Phys: Julien Nordmann MD  Sonographer Comments: Global longitudinal strain was attempted. IMPRESSIONS  1. Left ventricular ejection fraction, by estimation, is <20%. Left ventricular ejection fraction by PLAX is 6 %. The left ventricle has severely decreased function. The left ventricle demonstrates global hypokinesis. The left ventricular internal cavity size was severely dilated. Left ventricular diastolic parameters are indeterminate.  2. Right ventricular systolic function is normal. The right ventricular size is normal.  3. Left atrial size was mildly dilated.  4. Mild mitral valve regurgitation.  5. The inferior vena cava is normal in size with <50% respiratory variability, suggesting right atrial pressure of 8 mmHg. FINDINGS  Left Ventricle: Left ventricular ejection fraction, by estimation, is <20%. Left ventricular ejection fraction by PLAX is 6 %. The left ventricle has severely decreased function. The left ventricle demonstrates global hypokinesis. The left ventricular internal cavity size was severely dilated. There is no left ventricular hypertrophy. Left ventricular diastolic parameters are indeterminate. Right Ventricle: The right ventricular size is normal. No increase in right ventricular wall thickness. Right ventricular systolic function is  normal. Left Atrium: Left atrial size was mildly dilated. Right Atrium: Right atrial size was normal in size. Pericardium: There is no evidence of pericardial effusion. Mitral Valve: The mitral valve is normal in structure. Mild mitral valve regurgitation. No evidence of mitral valve stenosis. MV peak gradient, 14.1 mmHg. The mean mitral valve gradient is 8.0 mmHg. Tricuspid Valve: The tricuspid valve is normal in structure. Tricuspid valve regurgitation is mild . No evidence of tricuspid stenosis. Aortic Valve: The aortic valve is normal in structure. Aortic valve regurgitation is not visualized. No aortic stenosis is present. Aortic valve mean gradient measures 4.0 mmHg. Aortic valve peak gradient measures 6.4 mmHg. Aortic valve area, by VTI measures 1.26 cm. Pulmonic Valve: The pulmonic valve was normal in structure. Pulmonic valve regurgitation is mild. No evidence of pulmonic stenosis. Aorta: The aortic root is normal in size and structure. Venous: The inferior vena cava is normal in size with less than 50% respiratory variability, suggesting right atrial pressure of 8 mmHg. IAS/Shunts: No atrial level shunt detected by color flow Doppler.  LEFT VENTRICLE PLAX 2D LV EF:         Left            Diastology                ventricular     LV e' medial:   8.49 cm/s  ejection        LV E/e' medial: 18.7                fraction by                PLAX is 6 %. LVIDd:         6.90 cm LVIDs:         6.70 cm LV PW:         0.90 cm LV IVS:        0.70 cm LVOT diam:     1.80 cm LV SV:         27 LV SV Index:   17 LVOT Area:     2.54 cm  LV Volumes (MOD) LV vol d, MOD    172.0 ml A2C: LV vol d, MOD    146.0 ml A4C: LV vol s, MOD    136.0 ml A2C: LV vol s, MOD    127.0 ml A4C: LV SV MOD A2C:   36.0 ml LV SV MOD A4C:   146.0 ml LV SV MOD BP:    34.5 ml RIGHT VENTRICLE RV Basal diam:  2.70 cm LEFT ATRIUM             Index       RIGHT ATRIUM          Index LA diam:        4.10 cm 2.54 cm/m  RA Area:     8.98 cm  LA Vol (A2C):   75.4 ml 46.67 ml/m RA Volume:   17.90 ml 11.08 ml/m LA Vol (A4C):   54.0 ml 33.42 ml/m LA Biplane Vol: 66.0 ml 40.85 ml/m  AORTIC VALVE                   PULMONIC VALVE AV Area (Vmax):    1.62 cm    PV Vmax:       1.03 m/s AV Area (Vmean):   1.34 cm    PV Vmean:      68.900 cm/s AV Area (VTI):     1.26 cm    PV VTI:        0.185 m AV Vmax:           126.00 cm/s PV Peak grad:  4.2 mmHg AV Vmean:          90.200 cm/s PV Mean grad:  2.0 mmHg AV VTI:            0.216 m AV Peak Grad:      6.4 mmHg AV Mean Grad:      4.0 mmHg LVOT Vmax:         80.30 cm/s LVOT Vmean:        47.400 cm/s LVOT VTI:          0.107 m LVOT/AV VTI ratio: 0.50  AORTA Ao Root diam: 2.90 cm MITRAL VALVE MV Area (PHT): 4.33 cm     SHUNTS MV Area VTI:   0.74 cm     Systemic VTI:  0.11 m MV Peak grad:  14.1 mmHg    Systemic Diam: 1.80 cm MV Mean grad:  8.0 mmHg MV Vmax:       1.88 m/s MV Vmean:      133.0 cm/s MV Decel Time: 175 msec MV E velocity: 159.00 cm/s MV A velocity: 172.00 cm/s MV E/A ratio:  0.92 Julien Nordmannimothy Gollan MD Electronically signed by Julien Nordmannimothy Gollan MD Signature Date/Time: 07/16/2020/5:46:05 PM  Final     Cardiac Studies   See cath and echo above.  Patient Profile     65 y.o. female with history of CAD s/p PCI to LCx in 2005, chronic HFrEF, HTN, HLD, obesity, tobacco use, and poor compliance with follow-up, admitted with respiratory failure and STEMI.  Assessment & Plan    STEMI: EKG's at the time of presentation and leading up to [facility] reviewed, demonstrated widespread ST elevation.  Based on cath findings, I suspect that EKG changes most likely reflect marked demand ischemia in the setting of severe 3-vessel coronary artery disease and severe ischemic cardiomyopathy.  I do not see any targets for PCI or CABG, given the diffuse nature of her disease.  The patient will have completed 48-hours of IV heparin this evening.  Complete 48-72 hours of IV heparin.  Load with clopidogrel 300 mg x 1;  continue at least 12 months of aspirin and clopidogrel for medical therapy of ACS.  Continue atorvastatin 80 mg daily.  Acute on chronic HFrEF: Echo this admission shows LVEF < 20% with severe LV dilation consistent with long-standing cardiomyopathy.  LVEDP was moderately to severely elevated at the time of cath earlier this week.  Despite this, Ms. Pietila appears well-perfused and denies significant dyspnea.  I suspect that she is sill retaining fluid however, and would benefit from continued gentle diuresis for at least 1 more day.  Change furosemide to 20 mg IV BID.  Continue losartan.  Change metoprolol tartrate to metoprolol succinate 50 mg daily.  Please wean off methylprednisolone as quickly as possible.  Patient would benefit from outpatient advanced heart failure consultation.  NSVT: Asymptomatic.  Patient at high risk for VT and SCD in the setting of severely reduced LVEF and ICM.  Continue metoprolol, as above.  Maintain K>4.0 and Mg>2.0.  Recommend LifeVest at discharge, which patient is in agreement with.  COPD: Potentially contributing to dyspnea on admission, though I suspect acute CHF was the driving force.  Wean steroids when possible.  Abx per IM.  Tobacco cessation advised.  Dispo: I think [name] is stable for transfer telemetry.  She will likely need at least 1-2 more days of medical optimization before she is ready for discharge.  For questions or updates, please contact [facility] Please consult [email or web address] for contact info under [facility].     Signed, Yvonne Kendall, MD  07/17/2020, 9:03 AM

## 2020-07-18 DIAGNOSIS — I213 ST elevation (STEMI) myocardial infarction of unspecified site: Secondary | ICD-10-CM

## 2020-07-18 DIAGNOSIS — F17200 Nicotine dependence, unspecified, uncomplicated: Secondary | ICD-10-CM

## 2020-07-18 DIAGNOSIS — I255 Ischemic cardiomyopathy: Secondary | ICD-10-CM

## 2020-07-18 LAB — COMPREHENSIVE METABOLIC PANEL
ALT: 25 U/L (ref 0–44)
AST: 37 U/L (ref 15–41)
Albumin: 3.4 g/dL — ABNORMAL LOW (ref 3.5–5.0)
Alkaline Phosphatase: 51 U/L (ref 38–126)
Anion gap: 7 (ref 5–15)
BUN: 35 mg/dL — ABNORMAL HIGH (ref 8–23)
CO2: 24 mmol/L (ref 22–32)
Calcium: 8.7 mg/dL — ABNORMAL LOW (ref 8.9–10.3)
Chloride: 106 mmol/L (ref 98–111)
Creatinine, Ser: 1.08 mg/dL — ABNORMAL HIGH (ref 0.44–1.00)
GFR, Estimated: 58 mL/min — ABNORMAL LOW (ref >60)
Glucose, Bld: 136 mg/dL — ABNORMAL HIGH (ref 70–99)
Potassium: 3.9 mmol/L (ref 3.5–5.1)
Sodium: 137 mmol/L (ref 135–145)
Total Bilirubin: 0.6 mg/dL (ref 0.3–1.2)
Total Protein: 6.6 g/dL (ref 6.5–8.1)

## 2020-07-18 LAB — CBC
HCT: 34.2 % — ABNORMAL LOW (ref 36.0–46.0)
Hemoglobin: 11.5 g/dL — ABNORMAL LOW (ref 12.0–15.0)
MCH: 35.4 pg — ABNORMAL HIGH (ref 26.0–34.0)
MCHC: 33.6 g/dL (ref 30.0–36.0)
MCV: 105.2 fL — ABNORMAL HIGH (ref 80.0–100.0)
Platelets: 200 10*3/uL (ref 150–400)
RBC: 3.25 MIL/uL — ABNORMAL LOW (ref 3.87–5.11)
RDW: 13.5 % (ref 11.5–15.5)
WBC: 10.1 10*3/uL (ref 4.0–10.5)
nRBC: 0 % (ref 0.0–0.2)

## 2020-07-18 LAB — HEPARIN LEVEL (UNFRACTIONATED): Heparin Unfractionated: 0.86 IU/mL — ABNORMAL HIGH (ref 0.30–0.70)

## 2020-07-18 LAB — MAGNESIUM: Magnesium: 2.6 mg/dL — ABNORMAL HIGH (ref 1.7–2.4)

## 2020-07-18 LAB — PHOSPHORUS: Phosphorus: 3.3 mg/dL (ref 2.5–4.6)

## 2020-07-18 MED ORDER — LORATADINE 10 MG PO TABS
10.0000 mg | ORAL_TABLET | Freq: Every day | ORAL | Status: DC
  Administered 2020-07-18 – 2020-07-20 (×3): 10 mg via ORAL
  Filled 2020-07-18 (×3): qty 1

## 2020-07-18 MED ORDER — HEPARIN (PORCINE) 25000 UT/250ML-% IV SOLN: 1050.0000 [IU]/h | INTRAVENOUS | Status: DC

## 2020-07-18 NOTE — Progress Notes (Signed)
Progress Note  Patient Name: Elizabeth Herring Date of Encounter: 07/18/2020  [facility] Cardiologist: [facility]  Subjective   Denies any significant angina No shortness of breath, no PND orthopnea ZOLL LifeVest in place No significant arrhythmia on telemetry  Inpatient Medications    Scheduled Meds: . aspirin  81 mg Oral Daily  . atorvastatin  80 mg Oral Daily  . budesonide (PULMICORT) nebulizer solution  0.5 mg Nebulization BID  . clopidogrel  75 mg Oral Daily  . furosemide  20 mg Intravenous Daily  . levalbuterol  1.25 mg Nebulization Q6H   And  . ipratropium  0.5 mg Nebulization Q6H  . loratadine  10 mg Oral Daily  . losartan  25 mg Oral Daily  . mouth rinse  15 mL Mouth Rinse BID  . metoprolol succinate  25 mg Oral Daily  . nicotine  14 mg Transdermal Daily  . sodium chloride flush  3 mL Intravenous Q12H   Continuous Infusions: . sodium chloride    . azithromycin 500 mg (07/18/20 1656)  . cefTRIAXone (ROCEPHIN)  IV 2 g (07/18/20 1547)   PRN Meds: sodium chloride, acetaminophen, ALPRAZolam, morphine injection, nitroGLYCERIN, ondansetron (ZOFRAN) IV, ondansetron **OR** [DISCONTINUED] ondansetron (ZOFRAN) IV, sodium chloride flush   Vital Signs    Vitals:   07/18/20 1300 07/18/20 1400 07/18/20 1412 07/18/20 1600  BP: (!) 127/94 106/63  109/86  Pulse: 75 82  84  Resp: 17 (!) 23  (!) 22  Temp:    97.8 F (36.6 C)  TempSrc:    Oral  SpO2: 100% 95% 95% 99%  Weight:      Height:        Intake/Output Summary (Last 24 hours) at 07/18/2020 1756 Last data filed at 07/18/2020 0700 Gross per 24 hour  Intake 784.93 ml  Output 1400 ml  Net -615.07 ml   Last 3 Weights 07/15/2020 07/14/2020 07/14/2020  Weight (lbs) 142 lb 6.7 oz 143 lb 1.3 oz 130 lb  Weight (kg) 64.6 kg 64.9 kg 58.968 kg      Telemetry    Normal sinus rhythm- Personally Reviewed  ECG     - Personally Reviewed  Physical Exam   GEN: No acute distress.   Neck: No JVD Cardiac: RRR, no  murmurs, rubs, or gallops.  Respiratory: Clear to auscultation bilaterally. GI: Soft, nontender, non-distended  MS: No edema; No deformity. Neuro:  Nonfocal  Psych: Normal affect   Labs    High Sensitivity Troponin:   Recent Labs  Lab 07/15/20 1553 07/15/20 1714 07/16/20 1149  TROPONINIHS >27,000* 25,842* 16,118*      Chemistry Recent Labs  Lab 07/16/20 0156 07/16/20 1149 07/17/20 0341 07/18/20 0426  NA 138 136 138 137  K 2.9* 3.9 3.8 3.9  CL 98 99 103 106  CO2 27 23 24 24   GLUCOSE 138* 177* 145* 136*  BUN 11 17 24* 35*  CREATININE 0.82 0.96 1.02* 1.08*  CALCIUM 8.9 8.8* 8.8* 8.7*  PROT 7.4  --  7.0 6.6  ALBUMIN 3.7  --  3.3* 3.4*  AST 182*  --  72* 37  ALT 38  --  28 25  ALKPHOS 61  --  49 51  BILITOT 1.2  --  0.7 0.6  GFRNONAA >60 >60 >60 58*  ANIONGAP 13 14 11 7      Hematology Recent Labs  Lab 07/16/20 0156 07/17/20 0341 07/18/20 0426  WBC 7.7 12.2* 10.1  RBC 3.56* 3.45* 3.25*  HGB 12.7 12.3 11.5*  HCT 36.4  35.6* 34.2*  MCV 102.2* 103.2* 105.2*  MCH 35.7* 35.7* 35.4*  MCHC 34.9 34.6 33.6  RDW 13.0 13.5 13.5  PLT 190 198 200    BNP Recent Labs  Lab 07/15/20 0507 07/16/20 1925  BNP 1,437.7* 767.9*     DDimer No results for input(s): DDIMER in the last 168 hours.   Radiology    No results found.  Cardiac Studies   Echocardiogram Ejection fraction less than 20% global hypokinesis  Patient Profile     65 year old woman with history of coronary artery disease, ischemic cardiomyopathy, prior ejection fraction 2000 1420 to 25%, STEMI 2005 PCI to left circumflex, long history of smoking presenting with angina, STEMI, taken to the catheterization lab yesterday unsuccessful PCI of occluded left circumflex   Assessment & Plan    STEMI  Culprit vessel initially felt to be left circumflex, attempt at PCI unsuccessful  Echocardiogram with severely depressed global hypokinesis  -We will hold heparin infusion -Continue metoprolol succinate,  low-dose losartan, hydralazine -Lasix down to daily given climbing BUN and creatinine Low-dose Lasix oral dosing at discharge  Ischemic cardiomyopathy  Continue  losartan, metoprolol, hydralazine  Titration medication limited by hypotension Continue high intensity statin with aspirin  Has Zoll LifeVest in place   Smoker  Smoking cessation recommended     Hypokalemia  Stable   Bronchitis/COPD  3 months of symptoms Currently on azithromycin and ceftriaxone   Started CHF discussion, education  Total encounter time more than 35 minutes  Greater than 50% was spent in counseling and coordination of care with the patient   For questions or updates, please contact [facility] Please consult [email or web address] for contact info under        Signed, Julien Nordmann, MD  07/18/2020, 5:56 PM

## 2020-07-18 NOTE — Consult Note (Signed)
ANTICOAGULATION CONSULT NOTE  Pharmacy Consult for Heparin Infusion Indication: STEMI  Patient Measurements: Height: 5' (152.4 cm) Weight: 64.6 kg (142 lb 6.7 oz) IBW/kg (Calculated) : 45.5  Vital Signs:    Labs: Recent Labs    07/15/20 1553 07/15/20 1714 07/15/20 1717 07/16/20 0156 07/16/20 1149 07/16/20 2047 07/17/20 0341 07/18/20 0426  HGB  --   --    < > 12.7  --   --  12.3 11.5*  HCT  --   --   --  36.4  --   --  35.6* 34.2*  PLT  --   --   --  190  --   --  198 200  APTT  --  30  --   --   --   --   --   --   LABPROT  --  13.1  --   --   --   --   --   --   INR  --  1.0  --   --   --   --   --   --   HEPARINUNFRC  --   --    < > 0.21* 0.17* 0.63 0.59 0.86*  CREATININE  --   --    < > 0.82 0.96  --  1.02* 1.08*  TROPONINIHS >27,000* 65,784*  --   --  16,118*  --   --   --    < > = values in this interval not displayed.    Estimated Creatinine Clearance: 44.7 mL/min (A) (by C-G formula based on SCr of 1.08 mg/dL (H)).   Medical History: History reviewed. No pertinent past medical history.  Assessment: Pharmacy has been consulted to initiate Heparin Infusion on 64yo patient with ST elevation in anterior lateral leads. Troponin level over 27,000 was found and code STEMI was called by hospitalist. Patient was brought to the Cardiac Cath lab with intention to treat but attempted intervention was unsuccessful.  Patient has no history of PTA anticoagulation use.    Goal of Therapy:  Heparin level 0.3-0.7 units/ml Monitor platelets by anticoagulation protocol: Yes   3/10 2047 HL 0.63 therapeutic x 1 3/11 0341 HL 0.59, therapeutic x 2 3/12 0426 HL 0.86, supratherapeutic   Plan:  Decrease heparin to 1050 units/hr. Recheck HL in 6 hours after dose change HL and CBC daily while on heparin. Continue to monitor H&H and platelets  Otelia Sergeant, PharmD, [facility] 07/18/2020 6:50 AM

## 2020-07-18 NOTE — Progress Notes (Signed)
PROGRESS NOTE    Elizabeth Herring  ASN:[id number] DOB: Feb 24, 1956 DOA: 07/14/2020 PCP: Patient, No Pcp Per    Assessment & Plan:   Active Problems:   PNA (pneumonia)   Bronchitis due to tobacco use   Essential hypertension   Pneumonia  STEMI: w/ ST elevation in anterior lateral leads. Troponins >27,000 but trending down. Cardiac cath was attempted but was unsuccessful & unable to cross w/ a wire. Continue on metoprolol, losartan, aspirin & plavix. D/c IV heparin. Echo shows EF <20%, LV shows global hypokinesis & indeterminate diastolic function. Has life vest now. Continue on tele. Hx of cardiac stent approx 10 years ago   Pneumonia: continue on IV ceftriaxone, azithromycin. Weaned off of steroids. Continue on bronchodilators and incentive spirometry. Possible COPD, will need outpatient PFTs  Acute systolic CHF exacerbation: continue on IV lasix. Monitor I/Os. EF <20%. Continue w/ life vest. Continue on metoprolol, losartan, aspirin. Cardio following and recs   Leukocytosis: resolved   Hypokalemia: within normal limits   Hypophosphatemia: within normal limits   Pulmonary edema: improving. Continue on IV lasix. Monitor I/Os.   Tobacco abuse: nicotine patch to prevent w/drawal. Smoking cessation counseling    HTN: continue on losartan, metoprolol    DVT prophylaxis: lovenox  Code Status: full  Family Communication: Disposition Plan: likely d/c back home   Level of care: Stepdown   Status is: Inpatient  Remains inpatient appropriate because:Ongoing diagnostic testing needed not appropriate for outpatient work up, IV treatments appropriate due to intensity of illness or inability to take PO and Inpatient level of care appropriate due to severity of illness, pt does not have insurance and need medication assistance, TOC was consulted    Dispo: The patient is from: Home              Anticipated d/c is to: Home              Patient currently is not medically stable to  d/c.   Difficult to place patient Yes    Consultants:   Cardio    Procedures:    Antimicrobials: azithromycin, ceftriaxone    Subjective: Pt c/o malaise   Objective: Vitals:   07/17/20 2000 07/18/20 0000 07/18/20 0400 07/18/20 0700  BP:    129/79  Pulse:    86  Resp:    (!) 24  Temp: 97.9 F (36.6 C) 98 F (36.7 C) 97.9 F (36.6 C)   TempSrc: Axillary Axillary Axillary   SpO2:    93%  Weight:      Height:        Intake/Output Summary (Last 24 hours) at 07/18/2020 0826 Last data filed at 07/18/2020 0700 Gross per 24 hour  Intake 1225.41 ml  Output 1900 ml  Net -674.59 ml   Filed Weights   07/14/20 1627 07/14/20 2152 07/15/20 2000  Weight: 59 kg 64.9 kg 64.6 kg    Examination:  General exam: Appears comfortable  Respiratory system: diminished breath sounds b/l Cardiovascular system: S1/S2+. No clicks  Gastrointestinal system: Abd is soft, NT, ND & hypoactive bowel sounds  Central nervous system: Alert and oriented. Moves all 4 extremities   Psychiatry: Judgement and insight appear normal. Flat mood and affect    Data Reviewed: I have personally reviewed following labs and imaging studies  CBC: Recent Labs  Lab 07/14/20 1655 07/15/20 0507 07/16/20 0156 07/17/20 0341 07/18/20 0426  WBC 5.8 7.4 7.7 12.2* 10.1  NEUTROABS 4.6  --   --   --   --  HGB 14.2 12.5 12.7 12.3 11.5*  HCT 42.0 36.8 36.4 35.6* 34.2*  MCV 103.4* 104.5* 102.2* 103.2* 105.2*  PLT 219 185 190 198 200   Basic Metabolic Panel: Recent Labs  Lab 07/15/20 0507 07/15/20 1553 07/15/20 2235 07/16/20 0156 07/16/20 1149 07/17/20 0341 07/18/20 0426  NA 139  --   --  138 136 138 137  K 3.6  --  2.9* 2.9* 3.9 3.8 3.9  CL 104  --   --  98 99 103 106  CO2 25  --   --  27 23 24 24   GLUCOSE 111*  --   --  138* 177* 145* 136*  BUN 10  --   --  11 17 24* 35*  CREATININE 0.73  --   --  0.82 0.96 1.02* 1.08*  CALCIUM 8.8*  --   --  8.9 8.8* 8.8* 8.7*  MG  --    < > 2.6* 2.5* 2.5* 2.4  2.6*  PHOS  --   --  1.6* 1.9* 4.7* 3.5 3.3   < > = values in this interval not displayed.   GFR: Estimated Creatinine Clearance: 44.7 mL/min (A) (by C-G formula based on SCr of 1.08 mg/dL (H)). Liver Function Tests: Recent Labs  Lab 07/14/20 1655 07/16/20 0156 07/17/20 0341 07/18/20 0426  AST 85* 182* 72* 37  ALT 19 38 28 25  ALKPHOS 75 61 49 51  BILITOT 1.5* 1.2 0.7 0.6  PROT 8.2* 7.4 7.0 6.6  ALBUMIN 4.3 3.7 3.3* 3.4*   No results for input(s): LIPASE, AMYLASE in the last 168 hours. No results for input(s): AMMONIA in the last 168 hours. Coagulation Profile: Recent Labs  Lab 07/14/20 1655 07/15/20 1714  INR 1.0 1.0   Cardiac Enzymes: No results for input(s): CKTOTAL, CKMB, CKMBINDEX, TROPONINI in the last 168 hours. BNP (last 3 results) No results for input(s): PROBNP in the last 8760 hours. HbA1C: Recent Labs    07/16/20 0156  HGBA1C 5.6   CBG: Recent Labs  Lab 07/15/20 1935  GLUCAP 186*   Lipid Profile: Recent Labs    07/16/20 0156  CHOL 246*  HDL 93  LDLCALC 143*  TRIG 50  CHOLHDL 2.6   Thyroid Function Tests: No results for input(s): TSH, T4TOTAL, FREET4, T3FREE, THYROIDAB in the last 72 hours. Anemia Panel: No results for input(s): VITAMINB12, FOLATE, FERRITIN, TIBC, IRON, RETICCTPCT in the last 72 hours. Sepsis Labs: Recent Labs  Lab 07/14/20 1655 07/15/20 2050 07/16/20 1149  PROCALCITON  --  <0.10 0.14  LATICACIDVEN 1.5  --   --     Recent Results (from the past 240 hour(s))  SARS CORONAVIRUS 2 (TAT 6-24 HRS) Nasopharyngeal Nasopharyngeal Swab     Status: None   Collection Time: 07/14/20  2:10 PM   Specimen: Nasopharyngeal Swab  Result Value Ref Range Status   SARS Coronavirus 2 NEGATIVE NEGATIVE Final    Comment: (NOTE) SARS-CoV-2 target nucleic acids are NOT DETECTED.  The SARS-CoV-2 RNA is generally detectable in upper and lower respiratory specimens during the acute phase of infection. Negative results do not preclude  SARS-CoV-2 infection, do not rule out co-infections with other pathogens, and should not be used as the sole basis for treatment or other patient management decisions. Negative results must be combined with clinical observations, patient history, and epidemiological information. The expected result is Negative.  Fact Sheet for Patients: 09/13/20  Fact Sheet for Healthcare Providers: HairSlick.no  This test is not yet approved or cleared by the  [place] [employer] and  has been authorized for detection and/or diagnosis of SARS-CoV-2 by FDA under an TEFL teacher (EUA). This EUA will remain  in effect (meaning this test can be used) for the duration of the COVID-19 declaration under Se ction 564(b)(1) of the Act, 21 U.S.C. section 360bbb-3(b)(1), unless the authorization is terminated or revoked sooner.  Performed at [facility], [address] [place], [place] [address]   Resp Panel by RT-PCR (Flu A&B, Covid) Nasopharyngeal Swab     Status: None   Collection Time: 07/14/20  4:55 PM   Specimen: Nasopharyngeal Swab; Nasopharyngeal(NP) swabs in vial transport medium  Result Value Ref Range Status   SARS Coronavirus 2 by RT PCR NEGATIVE NEGATIVE Final    Comment: (NOTE) SARS-CoV-2 target nucleic acids are NOT DETECTED.  The SARS-CoV-2 RNA is generally detectable in upper respiratory specimens during the acute phase of infection. The lowest concentration of SARS-CoV-2 viral copies this assay can detect is 138 copies/mL. A negative result does not preclude SARS-Cov-2 infection and should not be used as the sole basis for treatment or other patient management decisions. A negative result may occur with  improper specimen collection/handling, submission of specimen other than nasopharyngeal swab, presence of viral mutation(s) within the areas targeted by this assay, and inadequate number of  viral copies(<138 copies/mL). A negative result must be combined with clinical observations, patient history, and epidemiological information. The expected result is Negative.  Fact Sheet for Patients:  BloggerCourse.com  Fact Sheet for Healthcare Providers:  SeriousBroker.it  This test is no t yet approved or cleared by the [place] FDA and  has been authorized for detection and/or diagnosis of SARS-CoV-2 by FDA under an Emergency Use Authorization (EUA). This EUA will remain  in effect (meaning this test can be used) for the duration of the COVID-19 declaration under Section 564(b)(1) of the Act, 21 U.S.C.section 360bbb-3(b)(1), unless the authorization is terminated  or revoked sooner.       Influenza A by PCR NEGATIVE NEGATIVE Final   Influenza B by PCR NEGATIVE NEGATIVE Final    Comment: (NOTE) The Xpert Xpress SARS-CoV-2/FLU/RSV plus assay is intended as an aid in the diagnosis of influenza from Nasopharyngeal swab specimens and should not be used as a sole basis for treatment. Nasal washings and aspirates are unacceptable for Xpert Xpress SARS-CoV-2/FLU/RSV testing.  Fact Sheet for Patients: BloggerCourse.com  Fact Sheet for Healthcare Providers: SeriousBroker.it  This test is not yet approved or cleared by the [place] FDA and has been authorized for detection and/or diagnosis of SARS-CoV-2 by FDA under an Emergency Use Authorization (EUA). This EUA will remain in effect (meaning this test can be used) for the duration of the COVID-19 declaration under Section 564(b)(1) of the Act, 21 U.S.C. section 360bbb-3(b)(1), unless the authorization is terminated or revoked.  Performed at [facility], [address] [place], [place] [address]   Blood Culture (routine x 2)     Status: None (Preliminary result)   Collection Time: 07/14/20  4:55 PM    Specimen: BLOOD  Result Value Ref Range Status   Specimen Description BLOOD RIGHT ANTECUBITAL  Final   Special Requests   Final    BOTTLES DRAWN AEROBIC AND ANAEROBIC Blood Culture adequate volume   Culture   Final    NO GROWTH 4 DAYS Performed at [facility], [address] [place], [place] [address]    Report Status PENDING  Incomplete  Blood Culture (routine x  2)     Status: None (Preliminary result)   Collection Time: 07/14/20  4:55 PM   Specimen: BLOOD  Result Value Ref Range Status   Specimen Description BLOOD LEFT ANTECUBITAL  Final   Special Requests   Final    BOTTLES DRAWN AEROBIC AND ANAEROBIC Blood Culture adequate volume   Culture   Final    NO GROWTH 4 DAYS Performed at [facility], [address] [place], [place] [address]    Report Status PENDING  Incomplete  MRSA PCR Screening     Status: None   Collection Time: 07/15/20  8:20 PM   Specimen: Nasopharyngeal  Result Value Ref Range Status   MRSA by PCR NEGATIVE NEGATIVE Final    Comment:        The GeneXpert MRSA Assay (FDA approved for NASAL specimens only), is one component of a comprehensive MRSA colonization surveillance program. It is not intended to diagnose MRSA infection nor to guide or monitor treatment for MRSA infections. Performed at [facility], [address] [place], [place] [address]          Radiology Studies: ECHOCARDIOGRAM COMPLETE  Result Date: 07/16/2020    ECHOCARDIOGRAM REPORT   Patient Name:   Elizabeth Herring Date of Exam: 07/16/2020 Medical Rec #:  [id number]             Height:       60.0 in Accession #:    [id number]            Weight:       142.4 lb Date of Birth:  07/28/1955             BSA:          1.616 m Patient Age:    64 years              BP:           118/79 mmHg Patient Gender: F                     HR:           81 bpm. Exam Location:  [facility] Procedure: 2D Echo, Cardiac Doppler, Color Doppler and Strain Analysis Indications:      R94.31 Abnormal ECG  History:         Patient has no prior history of Echocardiogram examinations.                  CAD.  Sonographer:     Humphrey RollsJoan Heiss RDCS (AE) Referring Phys:  [id number] Debbe OdeaBRIAN AGBOR-ETANG Diagnosing Phys: Julien Nordmannimothy Gollan MD  Sonographer Comments: Global longitudinal strain was attempted. IMPRESSIONS  1. Left ventricular ejection fraction, by estimation, is <20%. Left ventricular ejection fraction by PLAX is 6 %. The left ventricle has severely decreased function. The left ventricle demonstrates global hypokinesis. The left ventricular internal cavity size was severely dilated. Left ventricular diastolic parameters are indeterminate.  2. Right ventricular systolic function is normal. The right ventricular size is normal.  3. Left atrial size was mildly dilated.  4. Mild mitral valve regurgitation.  5. The inferior vena cava is normal in size with <50% respiratory variability, suggesting right atrial pressure of 8 mmHg. FINDINGS  Left Ventricle: Left ventricular ejection fraction, by estimation, is <20%. Left ventricular ejection fraction by PLAX is 6 %. The left ventricle has severely decreased function. The left ventricle demonstrates global hypokinesis. The left ventricular internal cavity size was severely dilated. There is no left  ventricular hypertrophy. Left ventricular diastolic parameters are indeterminate. Right Ventricle: The right ventricular size is normal. No increase in right ventricular wall thickness. Right ventricular systolic function is normal. Left Atrium: Left atrial size was mildly dilated. Right Atrium: Right atrial size was normal in size. Pericardium: There is no evidence of pericardial effusion. Mitral Valve: The mitral valve is normal in structure. Mild mitral valve regurgitation. No evidence of mitral valve stenosis. MV peak gradient, 14.1 mmHg. The mean mitral valve gradient is 8.0 mmHg. Tricuspid Valve: The tricuspid valve is normal in structure. Tricuspid valve  regurgitation is mild . No evidence of tricuspid stenosis. Aortic Valve: The aortic valve is normal in structure. Aortic valve regurgitation is not visualized. No aortic stenosis is present. Aortic valve mean gradient measures 4.0 mmHg. Aortic valve peak gradient measures 6.4 mmHg. Aortic valve area, by VTI measures 1.26 cm. Pulmonic Valve: The pulmonic valve was normal in structure. Pulmonic valve regurgitation is mild. No evidence of pulmonic stenosis. Aorta: The aortic root is normal in size and structure. Venous: The inferior vena cava is normal in size with less than 50% respiratory variability, suggesting right atrial pressure of 8 mmHg. IAS/Shunts: No atrial level shunt detected by color flow Doppler.  LEFT VENTRICLE PLAX 2D LV EF:         Left            Diastology                ventricular     LV e' medial:   8.49 cm/s                ejection        LV E/e' medial: 18.7                fraction by                PLAX is 6 %. LVIDd:         6.90 cm LVIDs:         6.70 cm LV PW:         0.90 cm LV IVS:        0.70 cm LVOT diam:     1.80 cm LV SV:         27 LV SV Index:   17 LVOT Area:     2.54 cm  LV Volumes (MOD) LV vol d, MOD    172.0 ml A2C: LV vol d, MOD    146.0 ml A4C: LV vol s, MOD    136.0 ml A2C: LV vol s, MOD    127.0 ml A4C: LV SV MOD A2C:   36.0 ml LV SV MOD A4C:   146.0 ml LV SV MOD BP:    34.5 ml RIGHT VENTRICLE RV Basal diam:  2.70 cm LEFT ATRIUM             Index       RIGHT ATRIUM          Index LA diam:        4.10 cm 2.54 cm/m  RA Area:     8.98 cm LA Vol (A2C):   75.4 ml 46.67 ml/m RA Volume:   17.90 ml 11.08 ml/m LA Vol (A4C):   54.0 ml 33.42 ml/m LA Biplane Vol: 66.0 ml 40.85 ml/m  AORTIC VALVE                   PULMONIC VALVE AV Area (Vmax):  1.62 cm    PV Vmax:       1.03 m/s AV Area (Vmean):   1.34 cm    PV Vmean:      68.900 cm/s AV Area (VTI):     1.26 cm    PV VTI:        0.185 m AV Vmax:           126.00 cm/s PV Peak grad:  4.2 mmHg AV Vmean:          90.200 cm/s PV  Mean grad:  2.0 mmHg AV VTI:            0.216 m AV Peak Grad:      6.4 mmHg AV Mean Grad:      4.0 mmHg LVOT Vmax:         80.30 cm/s LVOT Vmean:        47.400 cm/s LVOT VTI:          0.107 m LVOT/AV VTI ratio: 0.50  AORTA Ao Root diam: 2.90 cm MITRAL VALVE MV Area (PHT): 4.33 cm     SHUNTS MV Area VTI:   0.74 cm     Systemic VTI:  0.11 m MV Peak grad:  14.1 mmHg    Systemic Diam: 1.80 cm MV Mean grad:  8.0 mmHg MV Vmax:       1.88 m/s MV Vmean:      133.0 cm/s MV Decel Time: 175 msec MV E velocity: 159.00 cm/s MV A velocity: 172.00 cm/s MV E/A ratio:  0.92 Julien Nordmann MD Electronically signed by Julien Nordmann MD Signature Date/Time: 07/16/2020/5:46:05 PM    Final         Scheduled Meds: . aspirin  81 mg Oral Daily  . atorvastatin  80 mg Oral Daily  . budesonide (PULMICORT) nebulizer solution  0.5 mg Nebulization BID  . cetirizine  10 mg Oral Daily  . clopidogrel  75 mg Oral Daily  . furosemide  20 mg Intravenous Daily  . levalbuterol  1.25 mg Nebulization Q6H   And  . ipratropium  0.5 mg Nebulization Q6H  . losartan  25 mg Oral Daily  . mouth rinse  15 mL Mouth Rinse BID  . methylPREDNISolone (SOLU-MEDROL) injection  40 mg Intravenous Daily  . metoprolol succinate  25 mg Oral Daily  . nicotine  14 mg Transdermal Daily  . sodium chloride flush  3 mL Intravenous Q12H   Continuous Infusions: . sodium chloride    . azithromycin Stopped (07/17/20 1927)  . cefTRIAXone (ROCEPHIN)  IV Stopped (07/17/20 1819)  . heparin 1,050 Units/hr (07/18/20 0729)     LOS: 3 days    Time spent: 33 mins    Charise Killian, MD [facility] Pager [phone]  If 7PM-7AM, please contact night-coverage 07/18/2020, 8:26 AM

## 2020-07-18 NOTE — Progress Notes (Signed)
PHARMACY CONSULT NOTE  Pharmacy Consult for Electrolyte Monitoring and Replacement   Recent Labs: Potassium (mmol/L)  Date Value  07/18/2020 3.9   Magnesium (mg/dL)  Date Value  78/93/8101 2.6 (H)   Calcium (mg/dL)  Date Value  75/02/2584 8.7 (L)   Albumin (g/dL)  Date Value  27/78/2423 3.4 (L)   Phosphorus (mg/dL)  Date Value  53/61/4431 3.3   Sodium (mmol/L)  Date Value  07/18/2020 137    Assessment: 65 yo F presented with SOB and cough. CXR with RLL pneumonia. Pt with STEMI 3/9, PCI unsuccessful. . Pharmacy has been consulted to manage electrolytes.   Diuretics: furosemide 20 mg IV once daily  Goal of Therapy:  Potassium 4.0 - 5.1 mmol/L Magnesium 2.0 - 2.4 mg/dL All Other Electrolytes WNL  Plan:   Electrolytes stable  Monitor electrolytes with AM labs  Pricilla Riffle, PharmD 07/18/2020 8:19 AM

## 2020-07-18 NOTE — Plan of Care (Signed)
  Problem: Education: Goal: Knowledge of General Education information will improve Description: Including pain rating scale, medication(s)/side effects and non-pharmacologic comfort measures Outcome: Progressing   Problem: Health Behavior/Discharge Planning: Goal: Ability to manage health-related needs will improve Outcome: Progressing   Problem: Clinical Measurements: Goal: Respiratory complications will improve Outcome: Progressing   Problem: Clinical Measurements: Goal: Cardiovascular complication will be avoided Outcome: Progressing   

## 2020-07-19 DIAGNOSIS — F17200 Nicotine dependence, unspecified, uncomplicated: Secondary | ICD-10-CM

## 2020-07-19 DIAGNOSIS — I25118 Atherosclerotic heart disease of native coronary artery with other forms of angina pectoris: Secondary | ICD-10-CM

## 2020-07-19 LAB — COMPREHENSIVE METABOLIC PANEL
ALT: 24 U/L (ref 0–44)
AST: 27 U/L (ref 15–41)
Albumin: 3.6 g/dL (ref 3.5–5.0)
Alkaline Phosphatase: 53 U/L (ref 38–126)
Anion gap: 9 (ref 5–15)
BUN: 37 mg/dL — ABNORMAL HIGH (ref 8–23)
CO2: 24 mmol/L (ref 22–32)
Calcium: 9.1 mg/dL (ref 8.9–10.3)
Chloride: 106 mmol/L (ref 98–111)
Creatinine, Ser: 1.12 mg/dL — ABNORMAL HIGH (ref 0.44–1.00)
GFR, Estimated: 55 mL/min — ABNORMAL LOW (ref >60)
Glucose, Bld: 115 mg/dL — ABNORMAL HIGH (ref 70–99)
Potassium: 3.7 mmol/L (ref 3.5–5.1)
Sodium: 139 mmol/L (ref 135–145)
Total Bilirubin: 0.6 mg/dL (ref 0.3–1.2)
Total Protein: 6.7 g/dL (ref 6.5–8.1)

## 2020-07-19 LAB — CBC
HCT: 34.8 % — ABNORMAL LOW (ref 36.0–46.0)
Hemoglobin: 11.7 g/dL — ABNORMAL LOW (ref 12.0–15.0)
MCH: 35.5 pg — ABNORMAL HIGH (ref 26.0–34.0)
MCHC: 33.6 g/dL (ref 30.0–36.0)
MCV: 105.5 fL — ABNORMAL HIGH (ref 80.0–100.0)
Platelets: 202 10*3/uL (ref 150–400)
RBC: 3.3 MIL/uL — ABNORMAL LOW (ref 3.87–5.11)
RDW: 13.2 % (ref 11.5–15.5)
WBC: 7.4 10*3/uL (ref 4.0–10.5)
nRBC: 0 % (ref 0.0–0.2)

## 2020-07-19 LAB — MAGNESIUM: Magnesium: 2.3 mg/dL (ref 1.7–2.4)

## 2020-07-19 LAB — PHOSPHORUS: Phosphorus: 3.2 mg/dL (ref 2.5–4.6)

## 2020-07-19 LAB — CULTURE, BLOOD (ROUTINE X 2)
Culture: NO GROWTH
Culture: NO GROWTH
Special Requests: ADEQUATE
Special Requests: ADEQUATE

## 2020-07-19 MED ORDER — FUROSEMIDE 40 MG PO TABS
40.0000 mg | ORAL_TABLET | Freq: Every day | ORAL | Status: DC
  Administered 2020-07-20: 40 mg via ORAL
  Filled 2020-07-19: qty 1

## 2020-07-19 NOTE — Progress Notes (Signed)
PROGRESS NOTE    Elizabeth Herring  JOI:[id number] DOB: 1955-10-03 DOA: 07/14/2020 PCP: Patient, No Pcp Per    Assessment & Plan:   Principal Problem:   STEMI (ST elevation myocardial infarction) (HCC) Active Problems:   PNA (pneumonia)   Bronchitis due to tobacco use   Essential hypertension   Pneumonia   Ischemic cardiomyopathy   Smoker  STEMI: w/ ST elevation in anterior lateral leads. Troponins >27,000 but trending down. Cardiac cath was attempted but was unsuccessful & unable to cross w/ a wire. Continue on metoprolol, losartan, aspirin & plavix. D/c IV heparin. Echo shows EF <20%, LV shows global hypokinesis & indeterminate diastolic function. Has life vest now. Continue on tele. Hx of cardiac stent approx 10 years ago   Pneumonia: completed course of abxs and weaned off of steroids. Continue w/ bronchodilators and encourage incentive spirometry. Will need outpatient PFTs  Acute systolic CHF exacerbation: continue on po lasix. Monitor I/Os. EF <20%. Continue w/ losartan, metoprolol, aspirin. Continue w/ life vest   Leukocytosis: resolved   Hypokalemia: WNL today  Hypophosphatemia: WNL today  Pulmonary edema: continues to improve. Continue on po lasix.   Tobacco abuse: smoking cessation counseling. Nicotine patch to prevent w/drawal  HTN: continue on BB, ARB   DVT prophylaxis: lovenox  Code Status: full  Family Communication: Disposition Plan: likely d/c back home   Level of care: Progressive Cardiac   Status is: Inpatient  Remains inpatient appropriate because:Ongoing diagnostic testing needed not appropriate for outpatient work up, IV treatments appropriate due to intensity of illness or inability to take PO and Inpatient level of care appropriate due to severity of illness, will leave tomorrow AM as pt has no insurance & needs medication assistance. Will get meds from medication management    Dispo: The patient is from: Home              Anticipated d/c  is to: Home              Patient currently is medically stable for d/c    Difficult to place patient Yes    Consultants:   Cardio    Procedures:    Antimicrobials:    Subjective: Pt c/o fatigue   Objective: Vitals:   07/18/20 2004 07/18/20 2100 07/19/20 0531 07/19/20 0753  BP: 118/80  (!) 141/78   Pulse: 85  75   Resp: 18  16   Temp: (!) 97.1 F (36.2 C) 97.6 F (36.4 C) 97.7 F (36.5 C)   TempSrc: Axillary Oral Oral   SpO2: 100%  98% 97%  Weight:   67.7 kg   Height:        Intake/Output Summary (Last 24 hours) at 07/19/2020 0800 Last data filed at 07/18/2020 2008 Gross per 24 hour  Intake 355 ml  Output --  Net 355 ml   Filed Weights   07/14/20 2152 07/15/20 2000 07/19/20 0531  Weight: 64.9 kg 64.6 kg 67.7 kg    Examination:  General exam: Appears calm & comfortable  Respiratory system: decreased breath sounds b/l  Cardiovascular system: S1 &S2+. No rubs or clicks  Gastrointestinal system: Abd is soft, NT, ND & normal bowel sounds  Central nervous system: Alert and oriented. Moves all 4 extremities  Psychiatry: Judgement and insight appear normal. Flat mood and affect    Data Reviewed: I have personally reviewed following labs and imaging studies  CBC: Recent Labs  Lab 07/14/20 1655 07/15/20 0507 07/16/20 0156 07/17/20 0341 07/18/20 0426 07/19/20 0442  WBC  5.8 7.4 7.7 12.2* 10.1 7.4  NEUTROABS 4.6  --   --   --   --   --   HGB 14.2 12.5 12.7 12.3 11.5* 11.7*  HCT 42.0 36.8 36.4 35.6* 34.2* 34.8*  MCV 103.4* 104.5* 102.2* 103.2* 105.2* 105.5*  PLT 219 185 190 198 200 202   Basic Metabolic Panel: Recent Labs  Lab 07/16/20 0156 07/16/20 1149 07/17/20 0341 07/18/20 0426 07/19/20 0442  NA 138 136 138 137 139  K 2.9* 3.9 3.8 3.9 3.7  CL 98 99 103 106 106  CO2 27 23 24 24 24   GLUCOSE 138* 177* 145* 136* 115*  BUN 11 17 24* 35* 37*  CREATININE 0.82 0.96 1.02* 1.08* 1.12*  CALCIUM 8.9 8.8* 8.8* 8.7* 9.1  MG 2.5* 2.5* 2.4 2.6* 2.3   PHOS 1.9* 4.7* 3.5 3.3 3.2   GFR: Estimated Creatinine Clearance: 44.2 mL/min (A) (by C-G formula based on SCr of 1.12 mg/dL (H)). Liver Function Tests: Recent Labs  Lab 07/14/20 1655 07/16/20 0156 07/17/20 0341 07/18/20 0426 07/19/20 0442  AST 85* 182* 72* 37 27  ALT 19 38 28 25 24   ALKPHOS 75 61 49 51 53  BILITOT 1.5* 1.2 0.7 0.6 0.6  PROT 8.2* 7.4 7.0 6.6 6.7  ALBUMIN 4.3 3.7 3.3* 3.4* 3.6   No results for input(s): LIPASE, AMYLASE in the last 168 hours. No results for input(s): AMMONIA in the last 168 hours. Coagulation Profile: Recent Labs  Lab 07/14/20 1655 07/15/20 1714  INR 1.0 1.0   Cardiac Enzymes: No results for input(s): CKTOTAL, CKMB, CKMBINDEX, TROPONINI in the last 168 hours. BNP (last 3 results) No results for input(s): PROBNP in the last 8760 hours. HbA1C: No results for input(s): HGBA1C in the last 72 hours. CBG: Recent Labs  Lab 07/15/20 1935  GLUCAP 186*   Lipid Profile: No results for input(s): CHOL, HDL, LDLCALC, TRIG, CHOLHDL, LDLDIRECT in the last 72 hours. Thyroid Function Tests: No results for input(s): TSH, T4TOTAL, FREET4, T3FREE, THYROIDAB in the last 72 hours. Anemia Panel: No results for input(s): VITAMINB12, FOLATE, FERRITIN, TIBC, IRON, RETICCTPCT in the last 72 hours. Sepsis Labs: Recent Labs  Lab 07/14/20 1655 07/15/20 2050 07/16/20 1149  PROCALCITON  --  <0.10 0.14  LATICACIDVEN 1.5  --   --     Recent Results (from the past 240 hour(s))  SARS CORONAVIRUS 2 (TAT 6-24 HRS) Nasopharyngeal Nasopharyngeal Swab     Status: None   Collection Time: 07/14/20  2:10 PM   Specimen: Nasopharyngeal Swab  Result Value Ref Range Status   SARS Coronavirus 2 NEGATIVE NEGATIVE Final    Comment: (NOTE) SARS-CoV-2 target nucleic acids are NOT DETECTED.  The SARS-CoV-2 RNA is generally detectable in upper and lower respiratory specimens during the acute phase of infection. Negative results do not preclude SARS-CoV-2 infection, do not  rule out co-infections with other pathogens, and should not be used as the sole basis for treatment or other patient management decisions. Negative results must be combined with clinical observations, patient history, and epidemiological information. The expected result is Negative.  Fact Sheet for Patients: 09/15/20  Fact Sheet for Healthcare Providers: 09/13/20  This test is not yet approved or cleared by the [place] FDA and  has been authorized for detection and/or diagnosis of SARS-CoV-2 by FDA under an Emergency Use Authorization (EUA). This EUA will remain  in effect (meaning this test can be used) for the duration of the COVID-19 declaration under Se ction 564(b)(1) of the  Act, 21 U.S.C. section 360bbb-3(b)(1), unless the authorization is terminated or revoked sooner.  Performed at [facility], [address] [place], [place] [address]   Resp Panel by RT-PCR (Flu A&B, Covid) Nasopharyngeal Swab     Status: None   Collection Time: 07/14/20  4:55 PM   Specimen: Nasopharyngeal Swab; Nasopharyngeal(NP) swabs in vial transport medium  Result Value Ref Range Status   SARS Coronavirus 2 by RT PCR NEGATIVE NEGATIVE Final    Comment: (NOTE) SARS-CoV-2 target nucleic acids are NOT DETECTED.  The SARS-CoV-2 RNA is generally detectable in upper respiratory specimens during the acute phase of infection. The lowest concentration of SARS-CoV-2 viral copies this assay can detect is 138 copies/mL. A negative result does not preclude SARS-Cov-2 infection and should not be used as the sole basis for treatment or other patient management decisions. A negative result may occur with  improper specimen collection/handling, submission of specimen other than nasopharyngeal swab, presence of viral mutation(s) within the areas targeted by this assay, and inadequate number of viral copies(<138 copies/mL). A negative  result must be combined with clinical observations, patient history, and epidemiological information. The expected result is Negative.  Fact Sheet for Patients:  BloggerCourse.com  Fact Sheet for Healthcare Providers:  SeriousBroker.it  This test is no t yet approved or cleared by the [place] FDA and  has been authorized for detection and/or diagnosis of SARS-CoV-2 by FDA under an Emergency Use Authorization (EUA). This EUA will remain  in effect (meaning this test can be used) for the duration of the COVID-19 declaration under Section 564(b)(1) of the Act, 21 U.S.C.section 360bbb-3(b)(1), unless the authorization is terminated  or revoked sooner.       Influenza A by PCR NEGATIVE NEGATIVE Final   Influenza B by PCR NEGATIVE NEGATIVE Final    Comment: (NOTE) The Xpert Xpress SARS-CoV-2/FLU/RSV plus assay is intended as an aid in the diagnosis of influenza from Nasopharyngeal swab specimens and should not be used as a sole basis for treatment. Nasal washings and aspirates are unacceptable for Xpert Xpress SARS-CoV-2/FLU/RSV testing.  Fact Sheet for Patients: BloggerCourse.com  Fact Sheet for Healthcare Providers: SeriousBroker.it  This test is not yet approved or cleared by the [place] FDA and has been authorized for detection and/or diagnosis of SARS-CoV-2 by FDA under an Emergency Use Authorization (EUA). This EUA will remain in effect (meaning this test can be used) for the duration of the COVID-19 declaration under Section 564(b)(1) of the Act, 21 U.S.C. section 360bbb-3(b)(1), unless the authorization is terminated or revoked.  Performed at [facility], [address] [place], [place] [address]   Blood Culture (routine x 2)     Status: None   Collection Time: 07/14/20  4:55 PM   Specimen: BLOOD  Result Value Ref Range Status   Specimen  Description BLOOD RIGHT ANTECUBITAL  Final   Special Requests   Final    BOTTLES DRAWN AEROBIC AND ANAEROBIC Blood Culture adequate volume   Culture   Final    NO GROWTH 5 DAYS Performed at [facility], [address] [place], [place] [address]    Report Status 07/19/2020 FINAL  Final  Blood Culture (routine x 2)     Status: None   Collection Time: 07/14/20  4:55 PM   Specimen: BLOOD  Result Value Ref Range Status   Specimen Description BLOOD LEFT ANTECUBITAL  Final   Special Requests   Final    BOTTLES DRAWN AEROBIC AND ANAEROBIC  Blood Culture adequate volume   Culture   Final    NO GROWTH 5 DAYS Performed at [facility], [address] [place], [place] [address]    Report Status 07/19/2020 FINAL  Final  MRSA PCR Screening     Status: None   Collection Time: 07/15/20  8:20 PM   Specimen: Nasopharyngeal  Result Value Ref Range Status   MRSA by PCR NEGATIVE NEGATIVE Final    Comment:        The GeneXpert MRSA Assay (FDA approved for NASAL specimens only), is one component of a comprehensive MRSA colonization surveillance program. It is not intended to diagnose MRSA infection nor to guide or monitor treatment for MRSA infections. Performed at [facility], [address] [place], [place] [address]          Radiology Studies: No results found.      Scheduled Meds: . aspirin  81 mg Oral Daily  . atorvastatin  80 mg Oral Daily  . budesonide (PULMICORT) nebulizer solution  0.5 mg Nebulization BID  . clopidogrel  75 mg Oral Daily  . furosemide  20 mg Intravenous Daily  . levalbuterol  1.25 mg Nebulization Q6H   And  . ipratropium  0.5 mg Nebulization Q6H  . loratadine  10 mg Oral Daily  . losartan  25 mg Oral Daily  . mouth rinse  15 mL Mouth Rinse BID  . metoprolol succinate  25 mg Oral Daily  . nicotine  14 mg Transdermal Daily  . sodium chloride flush  3 mL Intravenous Q12H   Continuous Infusions: . sodium chloride     . azithromycin 500 mg (07/18/20 1656)  . cefTRIAXone (ROCEPHIN)  IV 2 g (07/18/20 1547)     LOS: 4 days    Time spent: 30 mins    Charise KillianJamiese M Jetaime Pinnix, MD [facility] Pager [phone]  If 7PM-7AM, please contact night-coverage 07/19/2020, 8:00 AM

## 2020-07-19 NOTE — Progress Notes (Signed)
Progress Note  Patient Name: Elizabeth Herring Date of Encounter: 07/19/2020  [facility] Cardiologist: [facility]  Subjective   Denies shortness of breath Ambulating on the unit, no symptoms ZOLL LifeVest in place No significant arrhythmia on telemetry  Inpatient Medications    Scheduled Meds: . aspirin  81 mg Oral Daily  . atorvastatin  80 mg Oral Daily  . budesonide (PULMICORT) nebulizer solution  0.5 mg Nebulization BID  . clopidogrel  75 mg Oral Daily  . [START ON 07/20/2020] furosemide  40 mg Oral Daily  . levalbuterol  1.25 mg Nebulization Q6H   And  . ipratropium  0.5 mg Nebulization Q6H  . loratadine  10 mg Oral Daily  . losartan  25 mg Oral Daily  . mouth rinse  15 mL Mouth Rinse BID  . metoprolol succinate  25 mg Oral Daily  . nicotine  14 mg Transdermal Daily  . sodium chloride flush  3 mL Intravenous Q12H   Continuous Infusions: . sodium chloride     PRN Meds: sodium chloride, acetaminophen, ALPRAZolam, morphine injection, nitroGLYCERIN, ondansetron (ZOFRAN) IV, ondansetron **OR** [DISCONTINUED] ondansetron (ZOFRAN) IV, sodium chloride flush   Vital Signs    Vitals:   07/19/20 0800 07/19/20 1100 07/19/20 1331 07/19/20 1440  BP: 134/82 105/69  119/78  Pulse: 80 77  85  Resp: (!) 22   20  Temp: 98.8 F (37.1 C) 98.7 F (37.1 C)  98.6 F (37 C)  TempSrc: Oral Oral  Oral  SpO2: 97% 98% 96% 100%  Weight:      Height:        Intake/Output Summary (Last 24 hours) at 07/19/2020 1727 Last data filed at 07/19/2020 0950 Gross per 24 hour  Intake 475 ml  Output --  Net 475 ml   Last 3 Weights 07/19/2020 07/15/2020 07/14/2020  Weight (lbs) 149 lb 4 oz 142 lb 6.7 oz 143 lb 1.3 oz  Weight (kg) 67.7 kg 64.6 kg 64.9 kg      Telemetry    Normal sinus rhythm- Personally Reviewed  ECG     - Personally Reviewed  Physical Exam   Constitutional:  oriented to person, place, and time. No distress.  HENT:  Head: Grossly normal Eyes:  no discharge. No  scleral icterus.  Neck: No JVD, no carotid bruits  Cardiovascular: Regular rate and rhythm, no murmurs appreciated Pulmonary/Chest: Clear to auscultation bilaterally, no wheezes or rails Abdominal: Soft.  no distension.  no tenderness.  Musculoskeletal: Normal range of motion Neurological:  normal muscle tone. Coordination normal. No atrophy Skin: Skin warm and dry Psychiatric: normal affect, pleasant   Labs    High Sensitivity Troponin:   Recent Labs  Lab 07/15/20 1553 07/15/20 1714 07/16/20 1149  TROPONINIHS >27,000* 25,842* 16,118*      Chemistry Recent Labs  Lab 07/17/20 0341 07/18/20 0426 07/19/20 0442  NA 138 137 139  K 3.8 3.9 3.7  CL 103 106 106  CO2 24 24 24   GLUCOSE 145* 136* 115*  BUN 24* 35* 37*  CREATININE 1.02* 1.08* 1.12*  CALCIUM 8.8* 8.7* 9.1  PROT 7.0 6.6 6.7  ALBUMIN 3.3* 3.4* 3.6  AST 72* 37 27  ALT 28 25 24   ALKPHOS 49 51 53  BILITOT 0.7 0.6 0.6  GFRNONAA >60 58* 55*  ANIONGAP 11 7 9      Hematology Recent Labs  Lab 07/17/20 0341 07/18/20 0426 07/19/20 0442  WBC 12.2* 10.1 7.4  RBC 3.45* 3.25* 3.30*  HGB 12.3 11.5* 11.7*  HCT  35.6* 34.2* 34.8*  MCV 103.2* 105.2* 105.5*  MCH 35.7* 35.4* 35.5*  MCHC 34.6 33.6 33.6  RDW 13.5 13.5 13.2  PLT 198 200 202    BNP Recent Labs  Lab 07/15/20 0507 07/16/20 1925  BNP 1,437.7* 767.9*     DDimer No results for input(s): DDIMER in the last 168 hours.   Radiology    No results found.  Cardiac Studies   Echocardiogram Ejection fraction less than 20% global hypokinesis  Cardiac catheterization  Prox RCA lesion is 100% stenosed. CTO  Mid LM lesion is 25% stenosed.  Dist LAD lesion is 95% stenosed.  Prox LAD to Dist LAD lesion is 75% stenosed.  Dist LM to Prox LAD lesion is 50% stenosed.  Prox Cx to Mid Cx lesion is 100% stenosed. In-stent thrombosis of the stent IRA TIMI 0 flow  Moderate collaterals left to right left to left  Left ventricular function severely  depressed left ventricular enlargement EF between 25 and 30% globally  Unsuccessful attempted PCI and stent to ostial old stent to the circumflex with in-stent thrombosis failure to cross with a wire   Conclusion STEMI presentation in house late recognition Troponins were greater than 27,000 EKG had diffuse ST elevation inferior laterally at admission and 24 hours later when STEMI was called Diagnostic cardiac cath showed severely depressed left ventricular function ejection fraction was between 25 and 30% with left ventricular enlargement Coronaries Left main large minor distal disease  LAD was large diffuse 50 to 75% proximal to mid, diffuse 75 to 95% mid to distal Circumflex is large ostial stent occluded thrombotic in-stent thrombosis from an old stent TIMI 0 flow RCA medium to small a completely occluded proximally CTO  Intervention Unsuccessful attempt at PCI and stent of in-stent thrombosis of old proximal stent to circumflex Unable to cross with a wire Patient developed heart failure type symptoms Treated with Lasix Groin was mynx Patient was transferred to ICU for aggressive medical therapy for heart failure and post MI care   Patient Profile     65 year old woman with history of coronary artery disease, ischemic cardiomyopathy, prior ejection fraction 2000 1420 to 25%, STEMI 2005 PCI to left circumflex, long history of smoking presenting with angina, STEMI, taken to the catheterization lab ,  unsuccessful PCI of occluded left circumflex   Assessment & Plan    STEMI  Details as above, occluded RCA, left circumflex, diseased LAD  attempt at PCI of left circumflex unsuccessful  Echocardiogram with severely depressed global hypokinesis  Heparin 48 hours, Tolerating metoprolol succinate, low-dose losartan Unable to advance medications secondary to low blood pressure -Lasix 40 daily  Ischemic cardiomyopathy  Continue  losartan, metoprolol high intensity statin with  aspirin  Has Zoll LifeVest in place Close follow-up in clinic   Smoker  Smoking cessation recommended     Bronchitis/COPD  3 months of symptoms Off antibiotics, no symptoms  [facility] will sign off.   Medication Recommendations: No changes Other recommendations (labs, testing, etc): No further testing Follow up as an outpatient: Clinic follow-up   Attempt to get medications for her today, discussed with social work, nursing, hospitalist.  Suggested we try medical management, outpatient pharmacy both closed, charitable services to pay for several days of medications declined  Total encounter time more than 35 minutes  Greater than 50% was spent in counseling and coordination of care with the patient    For questions or updates, please contact [facility] Please consult [email or web address] for contact info under  Signed, Julien Nordmann, MD  07/19/2020, 5:27 PM

## 2020-07-20 ENCOUNTER — Other Ambulatory Visit: Payer: Self-pay | Admitting: Internal Medicine

## 2020-07-20 ENCOUNTER — Telehealth: Payer: Self-pay | Admitting: Cardiovascular Disease

## 2020-07-20 DIAGNOSIS — I2121 ST elevation (STEMI) myocardial infarction involving left circumflex coronary artery: Secondary | ICD-10-CM

## 2020-07-20 LAB — COMPREHENSIVE METABOLIC PANEL
ALT: 23 U/L (ref 0–44)
AST: 25 U/L (ref 15–41)
Albumin: 3.3 g/dL — ABNORMAL LOW (ref 3.5–5.0)
Alkaline Phosphatase: 55 U/L (ref 38–126)
Anion gap: 8 (ref 5–15)
BUN: 29 mg/dL — ABNORMAL HIGH (ref 8–23)
CO2: 25 mmol/L (ref 22–32)
Calcium: 8.8 mg/dL — ABNORMAL LOW (ref 8.9–10.3)
Chloride: 108 mmol/L (ref 98–111)
Creatinine, Ser: 1.01 mg/dL — ABNORMAL HIGH (ref 0.44–1.00)
GFR, Estimated: >60 mL/min (ref >60)
Glucose, Bld: 90 mg/dL (ref 70–99)
Potassium: 3.4 mmol/L — ABNORMAL LOW (ref 3.5–5.1)
Sodium: 141 mmol/L (ref 135–145)
Total Bilirubin: 0.7 mg/dL (ref 0.3–1.2)
Total Protein: 6.5 g/dL (ref 6.5–8.1)

## 2020-07-20 LAB — GLUCOSE, CAPILLARY: Glucose-Capillary: 88 mg/dL (ref 70–99)

## 2020-07-20 LAB — CBC
HCT: 36 % (ref 36.0–46.0)
Hemoglobin: 12.1 g/dL (ref 12.0–15.0)
MCH: 35.2 pg — ABNORMAL HIGH (ref 26.0–34.0)
MCHC: 33.6 g/dL (ref 30.0–36.0)
MCV: 104.7 fL — ABNORMAL HIGH (ref 80.0–100.0)
Platelets: 215 10*3/uL (ref 150–400)
RBC: 3.44 MIL/uL — ABNORMAL LOW (ref 3.87–5.11)
RDW: 13.1 % (ref 11.5–15.5)
WBC: 5.9 10*3/uL (ref 4.0–10.5)
nRBC: 0 % (ref 0.0–0.2)

## 2020-07-20 LAB — MAGNESIUM: Magnesium: 2 mg/dL (ref 1.7–2.4)

## 2020-07-20 LAB — PHOSPHORUS: Phosphorus: 3.9 mg/dL (ref 2.5–4.6)

## 2020-07-20 MED ORDER — CLOPIDOGREL BISULFATE 75 MG PO TABS: 75.0000 mg | ORAL_TABLET | Freq: Every day | ORAL | 0 refills | Status: DC

## 2020-07-20 MED ORDER — LOSARTAN POTASSIUM 25 MG PO TABS: 25.0000 mg | ORAL_TABLET | Freq: Every day | ORAL | 0 refills | Status: DC

## 2020-07-20 MED ORDER — IPRATROPIUM BROMIDE 0.02 % IN SOLN
0.5000 mg | Freq: Three times a day (TID) | RESPIRATORY_TRACT | Status: DC
  Administered 2020-07-20: 0.5 mg via RESPIRATORY_TRACT
  Filled 2020-07-20: qty 2.5

## 2020-07-20 MED ORDER — IPRATROPIUM BROMIDE 0.02 % IN SOLN: 0.5000 mg | Freq: Four times a day (QID) | RESPIRATORY_TRACT | Status: DC | PRN

## 2020-07-20 MED ORDER — IPRATROPIUM BROMIDE 0.02 % IN SOLN: 0.5000 mg | Freq: Four times a day (QID) | RESPIRATORY_TRACT | Status: DC

## 2020-07-20 MED ORDER — IPRATROPIUM BROMIDE 0.02 % IN SOLN: 0.5000 mg | Freq: Two times a day (BID) | RESPIRATORY_TRACT | Status: DC

## 2020-07-20 MED ORDER — LEVALBUTEROL HCL 1.25 MG/0.5ML IN NEBU
1.2500 mg | INHALATION_SOLUTION | Freq: Two times a day (BID) | RESPIRATORY_TRACT | Status: DC
  Filled 2020-07-20: qty 0.5

## 2020-07-20 MED ORDER — LEVALBUTEROL HCL 1.25 MG/0.5ML IN NEBU: 1.2500 mg | INHALATION_SOLUTION | Freq: Three times a day (TID) | RESPIRATORY_TRACT | Status: DC

## 2020-07-20 MED ORDER — FUROSEMIDE 40 MG PO TABS
40.0000 mg | ORAL_TABLET | Freq: Every day | ORAL | 0 refills | Status: DC
Start: 2020-07-21 — End: 2020-07-21

## 2020-07-20 MED ORDER — ASPIRIN 81 MG PO TBEC: 81.0000 mg | DELAYED_RELEASE_TABLET | Freq: Every day | ORAL | 0 refills | Status: DC

## 2020-07-20 MED ORDER — POTASSIUM CHLORIDE CRYS ER 20 MEQ PO TBCR
20.0000 meq | EXTENDED_RELEASE_TABLET | Freq: Once | ORAL | Status: AC
  Administered 2020-07-20: 20 meq via ORAL
  Filled 2020-07-20: qty 1

## 2020-07-20 MED ORDER — LEVALBUTEROL HCL 1.25 MG/0.5ML IN NEBU
1.2500 mg | INHALATION_SOLUTION | Freq: Three times a day (TID) | RESPIRATORY_TRACT | Status: DC
  Administered 2020-07-20: 1.25 mg via RESPIRATORY_TRACT
  Filled 2020-07-20 (×2): qty 0.5

## 2020-07-20 MED ORDER — LEVALBUTEROL HCL 1.25 MG/0.5ML IN NEBU
1.2500 mg | INHALATION_SOLUTION | Freq: Four times a day (QID) | RESPIRATORY_TRACT | Status: DC | PRN
  Filled 2020-07-20: qty 0.5

## 2020-07-20 MED ORDER — METOPROLOL SUCCINATE ER 25 MG PO TB24: 25.0000 mg | ORAL_TABLET | Freq: Every day | ORAL | 0 refills | Status: DC

## 2020-07-20 MED ORDER — ATORVASTATIN CALCIUM 80 MG PO TABS: 80.0000 mg | ORAL_TABLET | Freq: Every day | ORAL | 0 refills | Status: DC

## 2020-07-20 NOTE — Discharge Planning (Signed)
IV and tele removed.  Discharge papers given, explained and educated.  Discussed importance of low salt, fluid intake and consistancy of daily weights.  Informed of suggested FU appt and also given med management coupons to PU scripts [place]ross the street.  Once ready, will be wheeled to front and family transporting home via car.

## 2020-07-20 NOTE — TOC Transition Note (Signed)
Transition of Care [facility]) - CM/SW Discharge Note   Patient Details  Name: Elizabeth Herring MRN: [id number] Date of Birth: Nov 06, 1955  Transition of Care [facility]) CM/SW Contact:  Hetty Ely, RN Phone Number: 07/20/2020, 12:14 PM   Clinical Narrative:   Spoke with patient about Medication Management to get discharge meds refilled, location also discussed. Patient also given match letter with participating pharmacies if needed. Patient states she will be staying with Sister, no other TOC needs assessed.          Patient Goals and CMS Choice        Discharge Placement                       Discharge Plan and Services                                     Social Determinants of Health (SDOH) Interventions     Readmission Risk Interventions No flowsheet data found.

## 2020-07-20 NOTE — Telephone Encounter (Signed)
-----   Message from Antonieta Iba, MD sent at 07/19/2020  5:33 PM EDT ----- Leaving the hospital July 20, 2020, Can recall sometime during the week to arrange follow-up in clinic Thx TG

## 2020-07-20 NOTE — Discharge Summary (Signed)
Physician Discharge Summary  Elizabeth Herring:[id number] DOB: 11-03-1955 DOA: 07/14/2020  PCP: Patient, No Pcp Per  Admit date: 07/14/2020 Discharge date: 07/20/2020  Admitted From: home  Disposition: home  Recommendations for Outpatient Follow-up:  1. Follow up with PCP in 1-2 weeks 2. F/u w/ cardio, PA Leafy Kindle on 07/27/20 at 9:30  Home Health: no  Equipment/Devices: life vest  Discharge Condition: stable CODE STATUS: full Diet recommendation: Heart Healthy   Brief/Interim Summary: HPI was taken from Dr. Sedalia Muta: Elizabeth Herring is a 65 y.o. female with medical history significant for tobacco abuse, presented to the emergency department from urgent care center for chief concerns of shortness of breath.  At the management clinic, patient desatted to 87% on room air.  She states that she has been short of breath for 2 days.  She denies fever, chills.  She endorses cough that is productive of yellow dark sputum.  She denies chest pain and feeling like this before.  She denies sick contacts.  She does not know how much cigarettes she smokes daily however she states that her and her friends switch on and off sharing cigarettes.  At bedside, patient was able to tell me her name and age and the location of hospital.  She is using accessory muscles to breathe however she took her nasal cannula off stating that she wants to go to the restroom to urinate.  Social history: She lives with her sister.  She is retired and formerly worked at the [employer].  She smokes tobacco cigarettes daily.  Denies alcohol and recreational drug use.  ROS: Constitutional: no weight change, no fever ENT/Mouth: no sore throat, no rhinorrhea Eyes: no eye pain, no vision changes Cardiovascular: no chest pain, no dyspnea, no edema, no palpitations Respiratory: no cough, no sputum, no wheezing Gastrointestinal: no nausea, no vomiting, no diarrhea, no constipation Genitourinary: no urinary  incontinence, no dysuria, no hematuria Musculoskeletal: no arthralgias, no myalgias Skin: no skin lesions, no pruritus, Neuro:+weakness, no loss of consciousness, no syncope Psych: no anxiety, no depression,+decrease appetite Heme/Lymph: no bruising, no bleeding  ED Course: Discussed with ED provider, patient requiring hospitalization due to hypoxia secondary to pneumonia.  Vitals in the ED was initially afebrile with temperature of 98.1, respiration rate of 21, heart rate 122, blood pressure 202/132, satting at 94% on 3 L nasal cannula.   Hospital Course from Dr. Mayford Knife 3/9-3/14/22: Pt presented w/ shortness of breath and was found to have pneumonia. Pt was started on IV rocephin, IV azithromycin, & IV steroids. Pt completed the course of abxs, steroids while inpatient. Of note, pt was found to have STEMI on 07/15/20 and pt was taken for a cardiac cath. Pt was started on IV heparin drip as well. Cardiac cath was attempted but was unsuccessful and was unable to cross w/ a wire as per cardio. Pt was treated medically. Pt was started on metoprolol, losartan, lasix, aspirin & plavix. (Aspirin and plavix were started after IV heparin was d/c). Furthermore, pt's echo showed EF of <20%, LV showed global hypokinesis, indeterminate diastolic function. Pt received a life vest & how it works prior to d/c as well. Pt will f/u w/ cardio on 07/27/20 at 9:30. Also, pt's scripts were sent to medication management as pt does not have insurance. For more information, please see other progress/consult notes.   Discharge Diagnoses:  Principal Problem:   STEMI (ST elevation myocardial infarction) (HCC) Active Problems:   PNA (pneumonia)   Bronchitis due to tobacco use  Essential hypertension   Pneumonia   Ischemic cardiomyopathy   Smoker   STEMI: w/ ST elevation in anterior lateral leads. Troponins >27,000 but trending down. Cardiac cath was attempted but was unsuccessful & unable to cross w/ a wire.  Continue on metoprolol, losartan, aspirin & plavix. D/c IV heparin. Echo shows EF <20%, LV shows global hypokinesis & indeterminate diastolic function. Has life vest now. Continue on tele. Hx of cardiac stent approx 10 years ago   Pneumonia: completed course of abxs and weaned off of steroids. Continue w/ bronchodilators and encourage incentive spirometry. Will need outpatient PFTs  Acute systolic CHF exacerbation: continue on po lasix. Monitor I/Os. EF <20%. Continue w/ losartan, metoprolol, aspirin. Continue w/ life vest   Leukocytosis: resolved   Hypokalemia: WNL today  Hypophosphatemia: WNL today  Pulmonary edema: continues to improve. Continue on po lasix.   Tobacco abuse: smoking cessation counseling. Nicotine patch to prevent w/drawal  HTN: continue on BB, ARB  Discharge Instructions  Discharge Instructions    Diet - low sodium heart healthy   Complete by: As directed    Discharge instructions   Complete by: As directed    F/u PCP in 1-2 weeks. F/u w/ cardio, PA Leafy Kindle, on 07/27/20 at 9:30   Increase activity slowly   Complete by: As directed      Allergies as of 07/20/2020      Reactions   Chlorhexidine       Medication List    TAKE these medications   aspirin 81 MG EC tablet Take 1 tablet (81 mg total) by mouth daily.   atorvastatin 80 MG tablet Commonly known as: LIPITOR Take 1 tablet (80 mg total) by mouth daily. Start taking on: July 21, 2020   cetirizine 10 MG tablet Commonly known as: ZYRTEC Take 10 mg by mouth daily.   clopidogrel 75 MG tablet Commonly known as: PLAVIX Take 1 tablet (75 mg total) by mouth daily. Start taking on: July 21, 2020   furosemide 40 MG tablet Commonly known as: LASIX Take 1 tablet (40 mg total) by mouth daily. Start taking on: July 21, 2020   losartan 25 MG tablet Commonly known as: COZAAR Take 1 tablet (25 mg total) by mouth daily. Start taking on: July 21, 2020   metoprolol succinate 25 MG 24 hr  tablet Commonly known as: TOPROL-XL Take 1 tablet (25 mg total) by mouth daily. Start taking on: July 21, 2020   Multi-Vitamin tablet Take 1 tablet by mouth daily.            Durable Medical Equipment  (From admission, onward)         Start     Ordered   07/17/20 0928  For home use only DME Vest life vest  Once       Comments: Length of need 3 months.  Start date 07/17/20.   07/17/20 0928          Follow-up Information    Antonieta Iba, MD On 07/27/2020.   Specialty: Cardiology Why: @ 9:30am Contact information: [address] [place] [address] [phone]              Allergies  Allergen Reactions  . Chlorhexidine     Consult ations:  cardio   Procedures/Studies: DG Chest 2 View  Result Date: 07/14/2020 CLINICAL DATA:  Cough, shortness of breath EXAM: CHEST - 2 VIEW COMPARISON:  None. FINDINGS: Patchy right lower lobe opacity, suspicious for pneumonia. Suspected small  bilateral pleural effusions. No frank interstitial edema. The heart is top-normal in size. Visualized osseous structures are within normal limits. IMPRESSION: Patchy right lower lobe opacity, suspicious for pneumonia. Suspected small bilateral pleural effusions. Electronically Signed   By: Charline Bills M.D.   On: 07/14/2020 14:32   CARDIAC CATHETERIZATION  Result Date: 07/15/2020  Prox RCA lesion is 100% stenosed. CTO  Mid LM lesion is 25% stenosed.  Dist LAD lesion is 95% stenosed.  Prox LAD to Dist LAD lesion is 75% stenosed.  Dist LM to Prox LAD lesion is 50% stenosed.  Prox Cx to Mid Cx lesion is 100% stenosed. In-stent thrombosis of the stent IRA TIMI 0 flow  Moderate collaterals left to right left to left  Left ventricular function severely depressed left ventricular enlargement EF between 25 and 30% globally  Unsuccessful attempted PCI and stent to ostial old stent to the circumflex with in-stent thrombosis failure to cross with a wire  Conclusion STEMI  presentation in house late recognition Troponins were greater than 27,000 EKG had diffuse ST elevation inferior laterally at admission and 24 hours later when STEMI was called Diagnostic cardiac cath showed severely depressed left ventricular function ejection fraction was between 25 and 30% with left ventricular enlargement Coronaries Left main large minor distal disease LAD was large diffuse 50 to 75% proximal to mid, diffuse 75 to 95% mid to distal Circumflex is large ostial stent occluded thrombotic in-stent thrombosis from an old stent TIMI 0 flow RCA medium to small a completely occluded proximally CTO Intervention Unsuccessful attempt at PCI and stent of in-stent thrombosis of old proximal stent to circumflex Unable to cross with a wire Patient developed heart failure type symptoms Treated with Lasix Groin was mynx Patient was transferred to ICU for aggressive medical therapy for heart failure and post MI care   DG Chest Port 1 View  Result Date: 07/15/2020 CLINICAL DATA:  Pneumonia.  Shortness of breath.  Cough.  Fever. EXAM: PORTABLE CHEST 1 VIEW COMPARISON:  07/14/2020. FINDINGS: Cardiomegaly with and pulmonary venous congestion. Right base infiltrate most consistent pneumonia. Asymmetric edema could also present this fashion. Small moderate right-sided pleural effusion. No pneumothorax. IMPRESSION: 1. Cardiomegaly with pulmonary venous congestion. 2. Right base infiltrate most consistent with pneumonia. Asymmetric pulmonary edema could also present this fashion. Small moderate right pleural effusion. Electronically Signed   By: Maisie Fus  Register   On: 07/15/2020 12:32   ECHOCARDIOGRAM COMPLETE  Result Date: 07/16/2020    ECHOCARDIOGRAM REPORT   Patient Name:   [facility] Date of Exam: 07/16/2020 Medical Rec #:  [id number]             Height:       60.0 in Accession #:    [id number]            Weight:       142.4 lb Date of Birth:  [birth year]             BSA:          1.616 m Patient Age:     64 years              BP:           118/79 mmHg Patient Gender: F                     HR:           81 bpm. Exam Location:  [facility] Procedure: 2D Echo, Cardiac Doppler, Color Doppler and  Strain Analysis Indications:     R94.31 Abnormal ECG  History:         Patient has no prior history of Echocardiogram examinations.                  CAD.  Sonographer:     Humphrey Rolls RDCS (AE) Referring Phys:  [id number] Debbe Odea Diagnosing Phys: Julien Nordmann MD  Sonographer Comments: Global longitudinal strain was attempted. IMPRESSIONS  1. Left ventricular ejection fraction, by estimation, is <20%. Left ventricular ejection fraction by PLAX is 6 %. The left ventricle has severely decreased function. The left ventricle demonstrates global hypokinesis. The left ventricular internal cavity size was severely dilated. Left ventricular diastolic parameters are indeterminate.  2. Right ventricular systolic function is normal. The right ventricular size is normal.  3. Left atrial size was mildly dilated.  4. Mild mitral valve regurgitation.  5. The inferior vena cava is normal in size with <50% respiratory variability, suggesting right atrial pressure of 8 mmHg. FINDINGS  Left Ventricle: Left ventricular ejection fraction, by estimation, is <20%. Left ventricular ejection fraction by PLAX is 6 %. The left ventricle has severely decreased function. The left ventricle demonstrates global hypokinesis. The left ventricular internal cavity size was severely dilated. There is no left ventricular hypertrophy. Left ventricular diastolic parameters are indeterminate. Right Ventricle: The right ventricular size is normal. No increase in right ventricular wall thickness. Right ventricular systolic function is normal. Left Atrium: Left atrial size was mildly dilated. Right Atrium: Right atrial size was normal in size. Pericardium: There is no evidence of pericardial effusion. Mitral Valve: The mitral valve is normal in structure. Mild mitral  valve regurgitation. No evidence of mitral valve stenosis. MV peak gradient, 14.1 mmHg. The mean mitral valve gradient is 8.0 mmHg. Tricuspid Valve: The tricuspid valve is normal in structure. Tricuspid valve regurgitation is mild . No evidence of tricuspid stenosis. Aortic Valve: The aortic valve is normal in structure. Aortic valve regurgitation is not visualized. No aortic stenosis is present. Aortic valve mean gradient measures 4.0 mmHg. Aortic valve peak gradient measures 6.4 mmHg. Aortic valve area, by VTI measures 1.26 cm. Pulmonic Valve: The pulmonic valve was normal in structure. Pulmonic valve regurgitation is mild. No evidence of pulmonic stenosis. Aorta: The aortic root is normal in size and structure. Venous: The inferior vena cava is normal in size with less than 50% respiratory variability, suggesting right atrial pressure of 8 mmHg. IAS/Shunts: No atrial level shunt detected by color flow Doppler.  LEFT VENTRICLE PLAX 2D LV EF:         Left            Diastology                ventricular     LV e' medial:   8.49 cm/s                ejection        LV E/e' medial: 18.7                fraction by                PLAX is 6 %. LVIDd:         6.90 cm LVIDs:         6.70 cm LV PW:         0.90 cm LV IVS:        0.70 cm LVOT diam:     1.80 cm  LV SV:         27 LV SV Index:   17 LVOT Area:     2.54 cm  LV Volumes (MOD) LV vol d, MOD    172.0 ml A2C: LV vol d, MOD    146.0 ml A4C: LV vol s, MOD    136.0 ml A2C: LV vol s, MOD    127.0 ml A4C: LV SV MOD A2C:   36.0 ml LV SV MOD A4C:   146.0 ml LV SV MOD BP:    34.5 ml RIGHT VENTRICLE RV Basal diam:  2.70 cm LEFT ATRIUM             Index       RIGHT ATRIUM          Index LA diam:        4.10 cm 2.54 cm/m  RA Area:     8.98 cm LA Vol (A2C):   75.4 ml 46.67 ml/m RA Volume:   17.90 ml 11.08 ml/m LA Vol (A4C):   54.0 ml 33.42 ml/m LA Biplane Vol: 66.0 ml 40.85 ml/m  AORTIC VALVE                   PULMONIC VALVE AV Area (Vmax):    1.62 cm    PV Vmax:        1.03 m/s AV Area (Vmean):   1.34 cm    PV Vmean:      68.900 cm/s AV Area (VTI):     1.26 cm    PV VTI:        0.185 m AV Vmax:           126.00 cm/s PV Peak grad:  4.2 mmHg AV Vmean:          90.200 cm/s PV Mean grad:  2.0 mmHg AV VTI:            0.216 m AV Peak Grad:      6.4 mmHg AV Mean Grad:      4.0 mmHg LVOT Vmax:         80.30 cm/s LVOT Vmean:        47.400 cm/s LVOT VTI:          0.107 m LVOT/AV VTI ratio: 0.50  AORTA Ao Root diam: 2.90 cm MITRAL VALVE MV Area (PHT): 4.33 cm     SHUNTS MV Area VTI:   0.74 cm     Systemic VTI:  0.11 m MV Peak grad:  14.1 mmHg    Systemic Diam: 1.80 cm MV Mean grad:  8.0 mmHg MV Vmax:       1.88 m/s MV Vmean:      133.0 cm/s MV Decel Time: 175 msec MV E velocity: 159.00 cm/s MV A velocity: 172.00 cm/s MV E/A ratio:  0.92 Julien Nordmann MD Electronically signed by Julien Nordmann MD Signature Date/Time: 07/16/2020/5:46:05 PM    Final       Subjective: Pt denies any complaints   Discharge Exam: Vitals:   07/20/20 0820 07/20/20 1011  BP: 130/84 134/88  Pulse: 94 92  Resp: 18   Temp: 98.7 F (37.1 C)   SpO2: 95%    Vitals:   07/20/20 0457 07/20/20 0606 07/20/20 0820 07/20/20 1011  BP:  (!) 142/83 130/84 134/88  Pulse:  93 94 92  Resp:   18   Temp:  98.2 F (36.8 C) 98.7 F (37.1 C)   TempSrc:  Oral Oral   SpO2:  98%  95%   Weight: 65.6 kg     Height:        General: Pt is alert, awake, not in acute distress Cardiovascular: S1/S2 +, no rubs, no gallops Respiratory: CTA bilaterally, no wheezing, no rhonchi Abdominal: Soft, NT, ND, bowel sounds + Extremities: no edema, no cyanosis    The results of significant diagnostics from this hospitalization (including imaging, microbiology, ancillary and laboratory) are listed below for reference.     Microbiology: Recent Results (from the past 240 hour(s))  SARS CORONAVIRUS 2 (TAT 6-24 HRS) Nasopharyngeal Nasopharyngeal Swab     Status: None   Collection Time: 07/14/20  2:10 PM   Specimen:  Nasopharyngeal Swab  Result Value Ref Range Status   SARS Coronavirus 2 NEGATIVE NEGATIVE Final    Comment: (NOTE) SARS-CoV-2 target nucleic acids are NOT DETECTED.  The SARS-CoV-2 RNA is generally detectable in upper and lower respiratory specimens during the acute phase of infection. Negative results do not preclude SARS-CoV-2 infection, do not rule out co-infections with other pathogens, and should not be used as the sole basis for treatment or other patient management decisions. Negative results must be combined with clinical observations, patient history, and epidemiological information. The expected result is Negative.  Fact Sheet for Patients: HairSlick.no  Fact Sheet for Healthcare Providers: quierodirigir.com  This test is not yet approved or cleared by the [place] FDA and  has been authorized for detection and/or diagnosis of SARS-CoV-2 by FDA under an Emergency Use Authorization (EUA). This EUA will remain  in effect (meaning this test can be used) for the duration of the COVID-19 declaration under Se ction 564(b)(1) of the Act, 21 U.S.C. section 360bbb-3(b)(1), unless the authorization is terminated or revoked sooner.  Performed at [facility], [address] [place], [place] [address]   Resp Panel by RT-PCR (Flu A&B, Covid) Nasopharyngeal Swab     Status: None   Collection Time: 07/14/20  4:55 PM   Specimen: Nasopharyngeal Swab; Nasopharyngeal(NP) swabs in vial transport medium  Result Value Ref Range Status   SARS Coronavirus 2 by RT PCR NEGATIVE NEGATIVE Final    Comment: (NOTE) SARS-CoV-2 target nucleic acids are NOT DETECTED.  The SARS-CoV-2 RNA is generally detectable in upper respiratory specimens during the acute phase of infection. The lowest concentration of SARS-CoV-2 viral copies this assay can detect is 138 copies/mL. A negative result does not preclude SARS-Cov-2 infection and  should not be used as the sole basis for treatment or other patient management decisions. A negative result may occur with  improper specimen collection/handling, submission of specimen other than nasopharyngeal swab, presence of viral mutation(s) within the areas targeted by this assay, and inadequate number of viral copies(<138 copies/mL). A negative result must be combined with clinical observations, patient history, and epidemiological information. The expected result is Negative.  Fact Sheet for Patients:  BloggerCourse.com  Fact Sheet for Healthcare Providers:  SeriousBroker.it  This test is no t yet approved or cleared by the [place] FDA and  has been authorized for detection and/or diagnosis of SARS-CoV-2 by FDA under an Emergency Use Authorization (EUA). This EUA will remain  in effect (meaning this test can be used) for the duration of the COVID-19 declaration under Section 564(b)(1) of the Act, 21 U.S.C.section 360bbb-3(b)(1), unless the authorization is terminated  or revoked sooner.       Influenza A by PCR NEGATIVE NEGATIVE Final   Influenza B by PCR NEGATIVE NEGATIVE Final    Comment: (NOTE) The  Xpert Xpress SARS-CoV-2/FLU/RSV plus assay is intended as an aid in the diagnosis of influenza from Nasopharyngeal swab specimens and should not be used as a sole basis for treatment. Nasal washings and aspirates are unacceptable for Xpert Xpress SARS-CoV-2/FLU/RSV testing.  Fact Sheet for Patients: BloggerCourse.com  Fact Sheet for Healthcare Providers: SeriousBroker.it  This test is not yet approved or cleared by the [place] FDA and has been authorized for detection and/or diagnosis of SARS-CoV-2 by FDA under an Emergency Use Authorization (EUA). This EUA will remain in effect (meaning this test can be used) for the duration of the COVID-19 declaration  under Section 564(b)(1) of the Act, 21 U.S.C. section 360bbb-3(b)(1), unless the authorization is terminated or revoked.  Performed at [facility], [address] [place], [place] [address]   Blood Culture (routine x 2)     Status: None   Collection Time: 07/14/20  4:55 PM   Specimen: BLOOD  Result Value Ref Range Status   Specimen Description BLOOD RIGHT ANTECUBITAL  Final   Special Requests   Final    BOTTLES DRAWN AEROBIC AND ANAEROBIC Blood Culture adequate volume   Culture   Final    NO GROWTH 5 DAYS Performed at [facility], [address] [place], [place] [address]    Report Status 07/19/2020 FINAL  Final  Blood Culture (routine x 2)     Status: None   Collection Time: 07/14/20  4:55 PM   Specimen: BLOOD  Result Value Ref Range Status   Specimen Description BLOOD LEFT ANTECUBITAL  Final   Special Requests   Final    BOTTLES DRAWN AEROBIC AND ANAEROBIC Blood Culture adequate volume   Culture   Final    NO GROWTH 5 DAYS Performed at [facility], [address] [place], [place] [address]    Report Status 07/19/2020 FINAL  Final  MRSA PCR Screening     Status: None   Collection Time: 07/15/20  8:20 PM   Specimen: Nasopharyngeal  Result Value Ref Range Status   MRSA by PCR NEGATIVE NEGATIVE Final    Comment:        The GeneXpert MRSA Assay (FDA approved for NASAL specimens only), is one component of a comprehensive MRSA colonization surveillance program. It is not intended to diagnose MRSA infection nor to guide or monitor treatment for MRSA infections. Performed at [facility], [address] [place], [place] [address]      Labs: BNP (last 3 results) Recent Labs    07/15/20 0507 07/16/20 1925  BNP 1,437.7* 767.9*   Basic Metabolic Panel: Recent Labs  Lab 07/16/20 1149 07/17/20 0341 07/18/20 0426 07/19/20 0442 07/20/20 0533  NA 136 138 137 139 141  K 3.9 3.8 3.9 3.7 3.4*  CL 99 103 106 106 108  CO2  GLUCOSE 177* 145* 136* 115* 90  BUN 17 24* 35* 37* 29*  CREATININE 0.96 1.02* 1.08* 1.12* 1.01*  CALCIUM 8.8* 8.8* 8.7* 9.1 8.8*  MG 2.5* 2.4 2.6* 2.3 2.0  PHOS 4.7* 3.5 3.3 3.2 3.9   Liver Function Tests: Recent Labs  Lab 07/16/20 0156 07/17/20 0341 07/18/20 0426 07/19/20 0442 07/20/20 0533  AST 182* 72* 37 27 25  ALT 38 ALKPHOS 61 49 51 53 55  BILITOT 1.2 0.7 0.6 0.6 0.7  PROT 7.4 7.0 6.6 6.7 6.5  ALBUMIN 3.7 3.3* 3.4* 3.6 3.3*   No results for input(s): LIPASE, AMYLASE in  the last 168 hours. No results for input(s): AMMONIA in the last 168 hours. CBC: Recent Labs  Lab 07/14/20 1655 07/15/20 0507 07/16/20 0156 07/17/20 0341 07/18/20 0426 07/19/20 0442 07/20/20 0533  WBC 5.8   < > 7.7 12.2* 10.1 7.4 5.9  NEUTROABS 4.6  --   --   --   --   --   --   HGB 14.2   < > 12.7 12.3 11.5* 11.7* 12.1  HCT 42.0   < > 36.4 35.6* 34.2* 34.8* 36.0  MCV 103.4*   < > 102.2* 103.2* 105.2* 105.5* 104.7*  PLT 219   < > 190 198 200 202 215   < > = values in this interval not displayed.   Cardiac Enzymes: No results for input(s): CKTOTAL, CKMB, CKMBINDEX, TROPONINI in the last 168 hours. BNP: Invalid input(s): POCBNP CBG: Recent Labs  Lab 07/15/20 1935  GLUCAP 186*   D-Dimer No results for input(s): DDIMER in the last 72 hours. Hgb A1c No results for input(s): HGBA1C in the last 72 hours. Lipid Profile No results for input(s): CHOL, HDL, LDLCALC, TRIG, CHOLHDL, LDLDIRECT in the last 72 hours. Thyroid function studies No results for input(s): TSH, T4TOTAL, T3FREE, THYROIDAB in the last 72 hours.  Invalid input(s): FREET3 Anemia work up No results for input(s): VITAMINB12, FOLATE, FERRITIN, TIBC, IRON, RETICCTPCT in the last 72 hours. Urinalysis No results found for: COLORURINE, APPEARANCEUR, LABSPEC, PHURINE, GLUCOSEU, HGBUR, BILIRUBINUR, KETONESUR, PROTEINUR, UROBILINOGEN, NITRITE, LEUKOCYTESUR Sepsis Labs Invalid input(s): PROCALCITONIN,   WBC,  LACTICIDVEN Microbiology Recent Results (from the past 240 hour(s))  SARS CORONAVIRUS 2 (TAT 6-24 HRS) Nasopharyngeal Nasopharyngeal Swab     Status: None   Collection Time: 07/14/20  2:10 PM   Specimen: Nasopharyngeal Swab  Result Value Ref Range Status   SARS Coronavirus 2 NEGATIVE NEGATIVE Final    Comment: (NOTE) SARS-CoV-2 target nucleic acids are NOT DETECTED.  The SARS-CoV-2 RNA is generally detectable in upper and lower respiratory specimens during the acute phase of infection. Negative results do not preclude SARS-CoV-2 infection, do not rule out co-infections with other pathogens, and should not be used as the sole basis for treatment or other patient management decisions. Negative results must be combined with clinical observations, patient history, and epidemiological information. The expected result is Negative.  Fact Sheet for Patients: HairSlick.no  Fact Sheet for Healthcare Providers: quierodirigir.com  This test is not yet approved or cleared by the [place] FDA and  has been authorized for detection and/or diagnosis of SARS-CoV-2 by FDA under an Emergency Use Authorization (EUA). This EUA will remain  in effect (meaning this test can be used) for the duration of the COVID-19 declaration under Se ction 564(b)(1) of the Act, 21 U.S.C. section 360bbb-3(b)(1), unless the authorization is terminated or revoked sooner.  Performed at [facility], [address] [place], [place] [address]   Resp Panel by RT-PCR (Flu A&B, Covid) Nasopharyngeal Swab     Status: None   Collection Time: 07/14/20  4:55 PM   Specimen: Nasopharyngeal Swab; Nasopharyngeal(NP) swabs in vial transport medium  Result Value Ref Range Status   SARS Coronavirus 2 by RT PCR NEGATIVE NEGATIVE Final    Comment: (NOTE) SARS-CoV-2 target nucleic acids are NOT DETECTED.  The SARS-CoV-2 RNA is generally detectable in upper  respiratory specimens during the acute phase of infection. The lowest concentration of SARS-CoV-2 viral copies this assay can detect is 138 copies/mL. A negative result does not preclude SARS-Cov-2 infection and should  not be used as the sole basis for treatment or other patient management decisions. A negative result may occur with  improper specimen collection/handling, submission of specimen other than nasopharyngeal swab, presence of viral mutation(s) within the areas targeted by this assay, and inadequate number of viral copies(<138 copies/mL). A negative result must be combined with clinical observations, patient history, and epidemiological information. The expected result is Negative.  Fact Sheet for Patients:  BloggerCourse.comhttps://www.fda.gov/media/152166/download  Fact Sheet for Healthcare Providers:  SeriousBroker.ithttps://www.fda.gov/media/152162/download  This test is no t yet approved or cleared by the [place] FDA and  has been authorized for detection and/or diagnosis of SARS-CoV-2 by FDA under an Emergency Use Authorization (EUA). This EUA will remain  in effect (meaning this test can be used) for the duration of the COVID-19 declaration under Section 564(b)(1) of the Act, 21 U.S.C.section 360bbb-3(b)(1), unless the authorization is terminated  or revoked sooner.       Influenza A by PCR NEGATIVE NEGATIVE Final   Influenza B by PCR NEGATIVE NEGATIVE Final    Comment: (NOTE) The Xpert Xpress SARS-CoV-2/FLU/RSV plus assay is intended as an aid in the diagnosis of influenza from Nasopharyngeal swab specimens and should not be used as a sole basis for treatment. Nasal washings and aspirates are unacceptable for Xpert Xpress SARS-CoV-2/FLU/RSV testing.  Fact Sheet for Patients: BloggerCourse.comhttps://www.fda.gov/media/152166/download  Fact Sheet for Healthcare Providers: SeriousBroker.ithttps://www.fda.gov/media/152162/download  This test is not yet approved or cleared by the [place] FDA and has been  authorized for detection and/or diagnosis of SARS-CoV-2 by FDA under an Emergency Use Authorization (EUA). This EUA will remain in effect (meaning this test can be used) for the duration of the COVID-19 declaration under Section 564(b)(1) of the Act, 21 U.S.C. section 360bbb-3(b)(1), unless the authorization is terminated or revoked.  Performed at [facility], [address] [place], [place] [address]   Blood Culture (routine x 2)     Status: None   Collection Time: 07/14/20  4:55 PM   Specimen: BLOOD  Result Value Ref Range Status   Specimen Description BLOOD RIGHT ANTECUBITAL  Final   Special Requests   Final    BOTTLES DRAWN AEROBIC AND ANAEROBIC Blood Culture adequate volume   Culture   Final    NO GROWTH 5 DAYS Performed at [facility], [address] [place], [place] [address]    Report Status 07/19/2020 FINAL  Final  Blood Culture (routine x 2)     Status: None   Collection Time: 07/14/20  4:55 PM   Specimen: BLOOD  Result Value Ref Range Status   Specimen Description BLOOD LEFT ANTECUBITAL  Final   Special Requests   Final    BOTTLES DRAWN AEROBIC AND ANAEROBIC Blood Culture adequate volume   Culture   Final    NO GROWTH 5 DAYS Performed at [facility], [address] [place], [place] [address]    Report Status 07/19/2020 FINAL  Final  MRSA PCR Screening     Status: None   Collection Time: 07/15/20  8:20 PM   Specimen: Nasopharyngeal  Result Value Ref Range Status   MRSA by PCR NEGATIVE NEGATIVE Final    Comment:        The GeneXpert MRSA Assay (FDA approved for NASAL specimens only), is one component of a comprehensive MRSA colonization surveillance program. It is not intended to diagnose MRSA infection nor to guide or monitor treatment for MRSA infections. Performed at [facility], [address] [place], [place] [address]  Time coordinating discharge: Over 30 minutes  SIGNED:   Charise Killian, MD  [facility] 07/20/2020, 11:26 AM Pager   If 7PM-7AM, please contact night-coverage

## 2020-07-20 NOTE — Consult Note (Addendum)
   Heart Failure Nurse Navigator Note  HFrEF<20%, right ventricular systolic function is normal.  Left atrium is mildly dilated.  Mild mitral regurgitation.    She presented to the emergency room on March 8 with complaints of shortness of breath.  Chest x-ray performed on March 9 revealed cardiomegaly with pulmonary venous congestion.  Moderate right-sided pleural effusion.  Rapid was called due to worsening shortness of breath. EKG revealed STEMI and she was emergently taken to the Cath Lab, however there was unsuccessful PCI and stent placement to the left circumflex.    Comorbidities:  Coronary artery disease with previous stenting COPD    History of continued tobacco abuse.   Medications:  Aspirin 81 mg daily Atorvastatin 80 mg daily Plavix 75 mg daily Furosemide 40 mg daily Losartan 25 mg daily Metoprolol succinate 25 mg daily  Labs:  Sodium 141, potassium 3.4, chloride 108, CO2 25, BUN 29, creatinine 1.01. Intake 957 mL Output not documented Weight is 65.6 down from 67.6 of yesterday Blood pressure 134/88 BMI 28.26   Assessment:  General-she is awake and alert sitting up in the chair at bedside.  HEENT-pupils are equal, no JVD, cephalic.  Cardiac-heart tones of regular rate and rhythm.  Murmurs or gallops appreciated.   Chest-breath sounds are clear to posterior auscultation, no wheezes or rhonchi noted.   Musculoskeletal-there is no lower extremity edema noted.   Psych-is pleasant and appropriate, makes good eye contact.  Neurologic-speech is clear, moves all extremities without difficulty.   Initial meeting with patient.  Discussed heart failure and the function of her heart.  States that she knows that the function of her heart is been depressed for several years.  Discussed low-sodium diet, she states several years ago she removed salt from her diet, not using at the table, continues  to eat fresh vegetables and fruits.  Not 1 for using  convenience foods.  Also discussed restricting fluids.  Stressed the importance of weighing daily and recording.  She voices understanding.  Went over the zone magnet and what to report to physician.  Also discussed her medications, she is taking those and how they work.  Instructed if she feels she is having any side effects from the medication to discuss with her physician before discontinuing.  She voices understanding.  She was given information about the heart failure clinic along with an appointment.   Tresa Endo RN [facility]

## 2020-07-20 NOTE — Telephone Encounter (Signed)
Spoke with Taneda on 2 A to schedule hospital fu with Leafy Kindle on 3-21 at 0930 .

## 2020-07-27 ENCOUNTER — Encounter: Payer: Self-pay | Admitting: Physician Assistant

## 2020-07-27 ENCOUNTER — Other Ambulatory Visit: Payer: Self-pay

## 2020-07-27 ENCOUNTER — Ambulatory Visit (INDEPENDENT_AMBULATORY_CARE_PROVIDER_SITE_OTHER): Payer: Self-pay | Admitting: Physician Assistant

## 2020-07-27 VITALS — BP 108/66 | HR 93 | Ht 60.0 in | Wt 136.0 lb

## 2020-07-27 DIAGNOSIS — I25119 Atherosclerotic heart disease of native coronary artery with unspecified angina pectoris: Secondary | ICD-10-CM

## 2020-07-27 DIAGNOSIS — Z720 Tobacco use: Secondary | ICD-10-CM

## 2020-07-27 DIAGNOSIS — I951 Orthostatic hypotension: Secondary | ICD-10-CM

## 2020-07-27 DIAGNOSIS — I255 Ischemic cardiomyopathy: Secondary | ICD-10-CM

## 2020-07-27 DIAGNOSIS — I5020 Unspecified systolic (congestive) heart failure: Secondary | ICD-10-CM

## 2020-07-27 DIAGNOSIS — Z8679 Personal history of other diseases of the circulatory system: Secondary | ICD-10-CM

## 2020-07-27 DIAGNOSIS — I10 Essential (primary) hypertension: Secondary | ICD-10-CM

## 2020-07-27 DIAGNOSIS — Z5971 Insufficient health insurance coverage: Secondary | ICD-10-CM

## 2020-07-27 DIAGNOSIS — I252 Old myocardial infarction: Secondary | ICD-10-CM

## 2020-07-27 DIAGNOSIS — E785 Hyperlipidemia, unspecified: Secondary | ICD-10-CM

## 2020-07-27 DIAGNOSIS — Z5989 Other problems related to housing and economic circumstances: Secondary | ICD-10-CM

## 2020-07-27 MED ORDER — DAPAGLIFLOZIN PROPANEDIOL 5 MG PO TABS: 5.0000 mg | ORAL_TABLET | Freq: Every day | ORAL | 5 refills | Status: DC

## 2020-07-27 MED ORDER — ENTRESTO 24-26 MG PO TABS: 1.0000 | ORAL_TABLET | Freq: Two times a day (BID) | ORAL | 5 refills | Status: DC

## 2020-07-27 NOTE — Progress Notes (Signed)
Office Visit    Patient Name: Elizabeth Herring Date of Encounter: 07/27/2020  PCP:  Patient, No Pcp Per   [facility]  Cardiologist:  Julien Nordmann, MD  Advanced Practice Provider:  No care team member to display Electrophysiologist:  None   Chief Complaint    Chief Complaint  Patient presents with  . Follow-up    S/p hospitalization--LHC w/ MI Revascularization--unsuccessful stenting attempt    65 yo female with complex history of CAD, HFrEF, h/o STEMI l2005 with PCI to LCx, HTN, hyperlipidemia, obesity, current tobacco use, orthostatic hypotension, and who is being seen today for follow-up after STEMI s/p catheterization 07/15/2020 without intervention.  Past Medical History    No past medical history on file. Past Surgical History:  Procedure Laterality Date  . CORONARY/GRAFT ACUTE MI REVASCULARIZATION N/A 07/15/2020   Procedure: Coronary/Graft Acute MI Revascularization;  Surgeon: Alwyn Pea, MD;  Location: [facility] INVASIVE CV LAB;  Service: Cardiovascular;  Laterality: N/A;  . LEFT HEART CATH AND CORONARY ANGIOGRAPHY N/A 07/15/2020   Procedure: LEFT HEART CATH AND CORONARY ANGIOGRAPHY;  Surgeon: Alwyn Pea, MD;  Location: [facility] INVASIVE CV LAB;  Service: Cardiovascular;  Laterality: N/A;    Allergies  Allergies  Allergen Reactions  . Chlorhexidine     History of Present Illness    Elizabeth Herring is a 65 y.o. female with PMH as above.   She is a current smoker at 1/2 pack/day (previously smoked 6 cigarettes/day in 2014). She is not currently on any cardiac medications.  She has not followed up with any other cardiologist since 2014 at [facility] as below.  She is retired and formerly worked at a [employer].  She lives with her sister in [place].    She has a previous history of MI with PCI to her left circumflex in 2005. 2005 LHC showed 100% LCx, 70% LAD, 70% RCA, 0% left main disease.    2014 echo performed  at [facility] with 2014 EF 20 to 25%, severe global hypokinesis of the LV, mild LAE, moderate to severe MR, moderate TR, mild pulmonary hypertension, mild pulmonic regurgitation.  She was lost to follow-up after initial cardiology consult 11/2012 [facility] for orthostatic sx.  At that time, she was smoking 6 cigarettes/day with rare alcohol use.  She reported feeling dizzy if standing too quickly.  Her echo was reviewed with recommendation for aggressive risk factor modification as outlined in her care everywhere note.  She was then lost to follow-up and has not continued her cardiac medications.  She presented to [facility] emergency department 07/14/2020 after feeling weak or "off" over the weekend (starting Sunday 3/6), followed by progressive DOE/SOB starting Monday 07/13/20.  She presented to urgent care 3/8 and was then sent to the [facility] ED.  In the ED, she was noted to be using accessory muscles on exam.  She reported progressive shortness of breath, cough, and chest pain at that time. No CP reported - just SOB/DOE. Her cough was reportedly chronic with clear phlegm. Vitals significant for HTN, tachypnea, hypoxia, and sinus tachycardia. She was started on IV ceftriaxone and azithromycin and admitted for suspected bronchitis and right lower lobe pneumonia. Continued respiratory distress noted by RN staff.  IV fluids were then started --->discontinued 3/9 due to pulmonary edema.  Per RN notes, a 26 beat run of V. tach was noted on telemetry later that day (electrolytes with K 2.9).  High-sensitivity troponin then obtained with value greater than 27,000.  EKG showed sinus tachycardia and diffuse ST elevation anterior laterally with changes also noted on previous EKGs. Code STEMI activated. Subsequent LHC by Dr. Juliann Pares showed moderate to severely depressed LVSF (estimated 25-30%), RCA with CTO, mLM with 25%s, dLAD 95%s, pLAD-dLAD 75%s, dLM-pLAD 50%s, p-mLCx 100%s with in-stent thrombosis of the stent IRA TIMI 0 flow.   Moderate collaterals were noted from left to right and left to left.  Intervention with PCI was attempted but unsuccessful.  Case was thus aborted with recommendation for medical therapy. After cath, she noted symptoms as significantly improved.  She was discharged with [employer]oll Life vest and medial therapy.  Today, 07/27/2020, she returns to clinic and notes that she has been doing relatively well since discharge.  She is wearing her LifeVest today without any recent discharges at best.  She is weaning off of smoking and reports 5 cigarettes total since leaving the hospital.  She is doing this cold [place] with out any assistance.  Congratulations provided.  No alcohol use.  She reports BP has been relatively well controlled with SBP is normally 127/80 at home.  She denies any chest pain, palpitations, presyncope, syncope, orthopnea, PND, weight gain, early satiety, or any other concerning symptoms.  We reviewed her hospitalization today with recommendation for repeat labs as outlined below.  Her breathing is noted to be significantly improved from that of her previous hospital physical exams.  Volume status noted to be well controlled.  She reports medication compliance.  No signs or symptoms of volume overload.  Home Medications    Current Outpatient Medications on File Prior to Visit  Medication Sig Dispense Refill  . aspirin 81 MG EC tablet Take 1 tablet (81 mg total) by mouth daily. 30 tablet 0  . atorvastatin (LIPITOR) 80 MG tablet Take 1 tablet (80 mg total) by mouth daily. 30 tablet 0  . cetirizine (ZYRTEC) 10 MG tablet Take 10 mg by mouth daily.    . clopidogrel (PLAVIX) 75 MG tablet Take 1 tablet (75 mg total) by mouth daily. 30 tablet 0  . furosemide (LASIX) 40 MG tablet Take 1 tablet (40 mg total) by mouth daily. 30 tablet 0  . losartan (COZAAR) 25 MG tablet Take 1 tablet (25 mg total) by mouth daily. 30 tablet 0  . metoprolol succinate (TOPROL-XL) 25 MG 24 hr tablet Take 1 tablet (25 mg total)  by mouth daily. 30 tablet 0  . Multiple Vitamin (MULTI-VITAMIN) tablet Take 1 tablet by mouth daily.     No current facility-administered medications on file prior to visit.    Review of Systems    She denies chest pain, palpitations, dyspnea, pnd, orthopnea, n, v, dizziness, syncope, edema, weight gain, or early satiety. .   All other systems reviewed and are otherwise negative except as noted above.  Physical Exam    VS:  BP 108/66   Pulse 93   Ht 5' (1.524 m)   Wt 136 lb (61.7 kg)   BMI 26.56 kg/m  , BMI Body mass index is 26.56 kg/m. GEN: Well nourished, well developed, in no acute distress. HEENT: normal. Neck: Supple, no JVD, carotid bruits, or masses. Cardiac: RRR, no murmurs, rubs, or gallops. No clubbing, cyanosis, edema.  Radials/DP/PT 2+ and equal bilaterally.  Respiratory:  Respirations regular and unlabored, clear to auscultation bilaterally. GI: Soft, nontender, nondistended, BS + x 4. MS: no deformity or atrophy. Skin: warm and dry, no rash. Neuro:  Strength and sensation are intact. Psych: Normal affect.  Accessory Clinical  Findings    ECG personally reviewed by me today -NSR, 93 bpm, biatrial enlargement, repolarization abnormalities, T wave abnormalities noted in lateral V5, V6, 1, aVL as seen 3/8 and reviewed today- no acute changes.  VITALS Reviewed today   Temp Readings from Last 3 Encounters:  07/20/20 98.7 F (37.1 C) (Oral)  07/14/20 98.6 F (37 C) (Oral)   BP Readings from Last 3 Encounters:  07/27/20 108/66  07/20/20 122/83  07/14/20 (!) 169/122   Pulse Readings from Last 3 Encounters:  07/27/20 93  07/20/20 91  07/14/20 (!) 120    Wt Readings from Last 3 Encounters:  07/27/20 136 lb (61.7 kg)  07/20/20 144 lb 11.2 oz (65.6 kg)  07/14/20 130 lb (59 kg)     LABS  reviewed today   Lab Results  Component Value Date   WBC 5.9 07/20/2020   HGB 12.1 07/20/2020   HCT 36.0 07/20/2020   MCV 104.7 (H) 07/20/2020   PLT 215 07/20/2020    Lab Results  Component Value Date   CREATININE 1.01 (H) 07/20/2020   BUN 29 (H) 07/20/2020   NA 141 07/20/2020   K 3.4 (L) 07/20/2020   CL 108 07/20/2020   CO2 25 07/20/2020   Lab Results  Component Value Date   ALT 23 07/20/2020   AST 25 07/20/2020   ALKPHOS 55 07/20/2020   BILITOT 0.7 07/20/2020   Lab Results  Component Value Date   CHOL 246 (H) 07/16/2020   HDL 93 07/16/2020   LDLCALC 143 (H) 07/16/2020   TRIG 50 07/16/2020   CHOLHDL 2.6 07/16/2020    Lab Results  Component Value Date   HGBA1C 5.6 07/16/2020   Lab Results  Component Value Date   TSH 0.799 07/14/2020     STUDIES/PROCEDURES reviewed today   Echo 07/16/20 1. Left ventricular ejection fraction, by estimation, is <20%. Left  ventricular ejection fraction by PLAX is 6 %. The left ventricle has  severely decreased function. The left ventricle demonstrates global  hypokinesis. The left ventricular internal  cavity size was severely dilated. Left ventricular diastolic parameters  are indeterminate.  2. Right ventricular systolic function is normal. The right ventricular  size is normal.  3. Left atrial size was mildly dilated.  4. Mild mitral valve regurgitation.  5. The inferior vena cava is normal in size with <50% respiratory  variability, suggesting right atrial pressure of 8 mmHg.   LHC 07/15/20  Prox RCA lesion is 100% stenosed. CTO  Mid LM lesion is 25% stenosed.  Dist LAD lesion is 95% stenosed.  Prox LAD to Dist LAD lesion is 75% stenosed.  Dist LM to Prox LAD lesion is 50% stenosed.  Prox Cx to Mid Cx lesion is 100% stenosed. In-stent thrombosis of the stent IRA TIMI 0 flow  Moderate collaterals left to right left to left  Left ventricular function severely depressed left ventricular enlargement EF between 25 and 30% globally  Unsuccessful attempted PCI and stent to ostial old stent to the circumflex with in-stent thrombosis failure to cross with a wire Conclusion STEMI  presentation in house late recognition Troponins were greater than 27,000 EKG had diffuse ST elevation inferior laterally at admission and 24 hours later when STEMI was called Diagnostic cardiac cath showed severely depressed left ventricular function ejection fraction was between 25 and 30% with left ventricular enlargement Coronaries Left main large minor distal disease  LAD was large diffuse 50 to 75% proximal to mid, diffuse 75 to 95% mid to distal  Circumflex is large ostial stent occluded thrombotic in-stent thrombosis from an old stent TIMI 0 flow RCA medium to small a completely occluded proximally CTO Intervention Unsuccessful attempt at PCI and stent of in-stent thrombosis of old proximal stent to circumflex Unable to cross with a wire Patient developed heart failure type symptoms Treated with Lasix Groin was mynx Patient was transferred to ICU for aggressive medical therapy for heart failure and post MI care Coronary Diagrams   Diagnostic Dominance: Right   Previous echo 2014  PROCEDURE Study Quality: Technically adequate. - SUMMARY The left ventricle is severely dilated. There is normal left ventricular wall thickness.  Left ventricular systolic function is severely reduced. LV ejection fraction = 20-25%. There is severe global hypokinesis of the left ventricle. The right ventricle is normal in size and function. The left atrium is mildly dilated. There is moderate to severe mitral regurgitation. There is moderate tricuspid regurgitation. Mild pulmonary hypertension. Mild pulmonic valvular regurgitation. There is no pericardial effusion. Compared to prior study, significant changes have occured. - FINDINGS:  LEFT VENTRICLE There is normal left ventricular wall thickness. The left ventricle is  severely dilated. Left ventricular systolic function is severely reduced. LV  ejection fraction = 20-25%. Left ventricular filling pattern is pseudonormal.   Lateral E/E' suggestive of increased filling pressures. There is severe  global hypokinesis of the left ventricle. -  RIGHT VENTRICLE The right ventricle is normal in size and function.  LEFT ATRIUM The left atrium is mildly dilated.  RIGHT ATRIUM  Right atrial size is normal. - AORTIC VALVE The aortic valve is normal in structure and function. The aortic valve is  trileaflet. Trace (trivial) aortic regurgitation. - MITRAL VALVE The mitral valve leaflets appear normal. There is no evidence of stenosis,  fluttering, or prolapse. There is moderate to severe mitral regurgitation. MR  likely function secondary to dilated cardiomyopathy. - TRICUSPID VALVE Structurally normal tricuspid valve. There is moderate tricuspid  regurgitation. Estimated right atrial pressure is 10 mmHg.. Right ventricular  systolic pressure is elevated at 41-3mmHg. Mild pulmonary hypertension. - PULMONIC VALVE The pulmonic valve is not well visualized. Mild pulmonic valvular  regurgitation. - - VENOUS Pulmonary venous flow pattern not well visualized. IVC size was mildly  dilated. - EFFUSION There is no pericardial effusion. - - MMode/2D Measurements & Calculations IVSd: 0.90 cm LVIDd: 6.4 cm LVPWd: 1.0 cm LVIDs: 5.9 cm LA dim: 4.2 cm Ao root: 2.9 cm EDV(MOD-sp4): 143.0 ml ESV(MOD-sp4): 118.0 ml EDV(MOD-sp2): 145.0 ml ESV(MOD-sp2): 119.0 ml LVOT diam: 1.8 cm LA A2 area: 26.1 cm2 LA length (vol): 5.4 cm LA vol: 98.6 ml LA vol index: 57.2 ml/m2 LAA (a4): 23.9 cm2 RAA (a4): 16.6 cm2 Doppler Measurements & Calculations MV E max vel: 127.7 cm/sec MV A max vel: 116.6 cm/sec MV E/A: 1.1 Lat Peak E' Vel: 5.2 cm/sec E/Lat E`: 24.5 MV dec time: 0.16 sec SV(LVOT): 31.1 ml Ao max PG: 5.5 mmHg Ao mean PG: 2.3 mmHg Ao V2 VTI: 12.5 cm AVA (VTI): 2.5 cm2 LV V1 VTI: 12.2 cm TR max vel: 294.1 cm/sec TR max PG: 34.6 mmHg RVSP(TR): 44.6 mmHg RAP systole: 10.0 mmHg   Assessment & Plan     History of delayed STEMI  Coronary artery disease with previous history of STEMI/PCI (2005) --No current chest pain.  Reports improvement in breathing.  High-sensitivity troponin >27,000 during admission with subsequent cath and no intervention. EKG with TWI in lateral leads, which we discussed and as reviewed with DOD today.  Recommendation for medical  management.  Continue GDMIT  ASA 81 mg, beta-blocker, and statin.   Transition from [facility] to [facility]   Monitor BP closely. BP soft today, though with consideration of current Losartan.  Start low dose Entresto as BP and insurance allows. Entresto samples provided, coupon provided.  Start paperwork for pt assistance, given no insurance.   Discontinue Losartan with start of Entresto.  BMET today, RTC.  Check electrolytes  Renal function check  Start Farxiga per GDMT.  Continue statin.  Recheck lipids and LFTs 6-8w.  Aggressive risk factor modification.   Statin  Lifestyle  Smoking cessation recommended.   Very close follow-up recommended with pt agreement  ?  Cardiac rehab.,  Insurance coverage.  No need to investigate cardiac rehab coverage with regards to her insurance.  Chronic HFrEF --SOB reportedly improved from admission. Euvolemic on exam. Echo as above.  EF as above below <20%.    Lifevest in place. Continue.  Continue current medications.  Check BMET, CBC.  Escalate GDMT as tolerated.    Recheck echo once on GDMT / 3-6 mo.  Plans for OP follow-up with [facility] in [place], given she is closer to this location.  History of sinus tachycardia, NSVT, PVCs --Continue Toprol as BP allows with start of Entresto. Given history of ectopy, will keep Toprol at current dose. She knows to call the office if any dizziness.   HTN  --Current BP well controlled to soft.  Continue current medications as tolerated by BP, given softer pressures.  HLD --Continue statin.  LDL goal below 70.  Tobacco  use, current --Smoking cessation recommended.    Medication changes: Entresto. Stop Losartan. Start [employer] ordered: BMET, CBC Studies / Imaging ordered: Repeat echo in 3-6 mo Future considerations: Escalate GDMT. ?Spironolactone if BP allows Disposition: RTC in 2 weeks   Lennon Alstrom, PA-C 07/27/2020

## 2020-07-27 NOTE — Patient Instructions (Signed)
Medication Instructions:  Your physician has recommended you make the following change in your medication:   STOP Losartan  After you have stopped Losartan: START Entresto 24/26mg  TWICE daily    - Use samples given in office today   - Please fill out patient assistance paperwork and either bring back to the front desk or to next appointment  START Farxiga 5mg  DAILY  *If you need a refill on your cardiac medications before your next appointment, please call your pharmacy*   Lab Work: None ordered   Testing/Procedures: None ordered   Follow-Up: At [facility], you and your health needs are our priority.  As part of our continuing mission to provide you with exceptional heart care, we have created designated Provider Care Teams.  These Care Teams include your primary Cardiologist (physician) and Advanced Practice Providers (APPs -  Physician Assistants and Nurse Practitioners) who all work together to provide you with the care you need, when you need it.  We recommend signing up for the patient portal called "MyChart".  Sign up information is provided on this After Visit Summary.  MyChart is used to connect with patients for Virtual Visits (Telemedicine).  Patients are able to view lab/test results, encounter notes, upcoming appointments, etc.  Non-urgent messages can be sent to your provider as well.   To learn more about what you can do with MyChart, go to [facility].    Your next appointment:   2 week(s)  The format for your next appointment:   In Person  Provider:   You may see [email or web address], MD or one of the following Advanced Practice Providers on your designated Care Team:    Julien Nordmann, NP  Nicolasa Ducking, PA-C  Eula Listen, PA-C  Cadence [place], [place]  [place], NP    Other Instructions  Please call our office if you have any dizziness after starting new medication and/or if you have any chest pain prior to your next follow up  visit.

## 2020-08-10 ENCOUNTER — Ambulatory Visit: Payer: Medicaid Other | Admitting: Physician Assistant

## 2020-08-10 NOTE — Progress Notes (Deleted)
Office Visit    Patient Name: Elizabeth Herring Date of Encounter: 08/10/2020  PCP:  Patient, No Pcp Per (Inactive)   [facility]  Cardiologist:  Julien Nordmann, MD  Advanced Practice Provider:  No care team member to display Electrophysiologist:  None   Chief Complaint    No chief complaint on file.   65 yo female with complex history of CAD, HFrEF, h/o STEMI 2005 with PCI to LCx, HTN, hyperlipidemia, obesity, current tobacco use, orthostatic hypotension, and who is being seen today for follow-up after STEMI s/p catheterization 07/15/2020 without intervention.  Past Medical History    No past medical history on file. Past Surgical History:  Procedure Laterality Date  . CORONARY/GRAFT ACUTE MI REVASCULARIZATION N/A 07/15/2020   Procedure: Coronary/Graft Acute MI Revascularization;  Surgeon: Alwyn Pea, MD;  Location: [facility] INVASIVE CV LAB;  Service: Cardiovascular;  Laterality: N/A;  . LEFT HEART CATH AND CORONARY ANGIOGRAPHY N/A 07/15/2020   Procedure: LEFT HEART CATH AND CORONARY ANGIOGRAPHY;  Surgeon: Alwyn Pea, MD;  Location: [facility] INVASIVE CV LAB;  Service: Cardiovascular;  Laterality: N/A;    Allergies  Allergies  Allergen Reactions  . Chlorhexidine     History of Present Illness    Elizabeth Herring is a 65 y.o. female with PMH as above.   She is a current smoker at 1/2 pack/day (previously smoked 6 cigarettes/day in 2014). She is not currently on any cardiac medications.  She has not followed up with any other cardiologist since 2014 at [facility] as below.  She is retired and formerly worked at a [employer].  She lives with her sister in [place].    She has a previous history of MI with PCI to her left circumflex in 2005. 2005 LHC showed 100% LCx, 70% LAD, 70% RCA, 0% left main disease.    2014 echo performed at [facility] with 2014 EF 20 to 25%, severe global hypokinesis of the LV, mild LAE, moderate to severe  MR, moderate TR, mild pulmonary hypertension, mild pulmonic regurgitation.  She was lost to follow-up after initial cardiology consult 11/2012 [facility] for orthostatic sx.  At that time, she was smoking 6 cigarettes/day with rare alcohol use.  She reported feeling dizzy if standing too quickly.  Her echo was reviewed with recommendation for aggressive risk factor modification as outlined in her care everywhere note.  She was then lost to follow-up and has not continued her cardiac medications.  She presented to [facility] emergency department 07/14/2020 after feeling weak or "off" over the weekend (starting Sunday 3/6), followed by progressive DOE/SOB starting Monday 07/13/20.  She presented to urgent care 3/8 and was then sent to the [facility] ED.  In the ED, she was noted to be using accessory muscles on exam.  She reported progressive shortness of breath, cough, and chest pain at that time. No CP reported - just SOB/DOE. Her cough was reportedly chronic with clear phlegm. Vitals significant for HTN, tachypnea, hypoxia, and sinus tachycardia. She was started on IV ceftriaxone and azithromycin and admitted for suspected bronchitis and right lower lobe pneumonia. Continued respiratory distress noted by RN staff.  IV fluids were then started --->discontinued 3/9 due to pulmonary edema.  Per RN notes, a 26 beat run of V. tach was noted on telemetry later that day (electrolytes with K 2.9).  High-sensitivity troponin then obtained with value greater than 27,000.   EKG showed sinus tachycardia and diffuse ST elevation anterior laterally with changes also noted  on previous EKGs. Code STEMI activated. Subsequent LHC by Dr. Juliann Pares showed moderate to severely depressed LVSF (estimated 25-30%), RCA with CTO, mLM with 25%s, dLAD 95%s, pLAD-dLAD 75%s, dLM-pLAD 50%s, p-mLCx 100%s with in-stent thrombosis of the stent IRA TIMI 0 flow.  Moderate collaterals were noted from left to right and left to left.  Intervention with PCI was  attempted but unsuccessful.  Case was thus aborted with recommendation for medical therapy. After cath, she noted symptoms as significantly improved.  She was discharged with [employer] Life vest and medial therapy. --------  Today, 07/27/2020, she returns to clinic and notes that she has been doing relatively well since discharge.  She is wearing her LifeVest today without any recent discharges at best.  She is weaning off of smoking and reports 5 cigarettes total since leaving the hospital.  She is doing this cold [place] with out any assistance.  Congratulations provided.  No alcohol use.  She reports BP has been relatively well controlled with SBP is normally 127/80 at home.  She denies any chest pain, palpitations, presyncope, syncope, orthopnea, PND, weight gain, early satiety, or any other concerning symptoms.  We reviewed her hospitalization today with recommendation for repeat labs as outlined below.  Her breathing is noted to be significantly improved from that of her previous hospital physical exams.  Volume status noted to be well controlled.  She reports medication compliance.  No signs or symptoms of volume overload.   ---schedule echo  Home Medications    Current Outpatient Medications on File Prior to Visit  Medication Sig Dispense Refill  . aspirin 81 MG EC tablet TAKE ONE TABLET BY MOUTH EVERY DAY 30 tablet 0  . atorvastatin (LIPITOR) 80 MG tablet TAKE ONE TABLET BY MOUTH EVERY DAY 30 tablet 0  . cetirizine (ZYRTEC) 10 MG tablet Take 10 mg by mouth daily.    . clopidogrel (PLAVIX) 75 MG tablet TAKE ONE TABLET BY MOUTH EVERY DAY 30 tablet 0  . dapagliflozin propanediol (FARXIGA) 5 MG TABS tablet Take 1 tablet (5 mg total) by mouth daily before breakfast. 30 tablet 5  . furosemide (LASIX) 40 MG tablet TAKE ONE TABLET BY MOUTH EVERY DAY 30 tablet 0  . metoprolol succinate (TOPROL-XL) 25 MG 24 hr tablet TAKE ONE TABLET BY MOUTH EVERY DAY 30 tablet 0  . Multiple Vitamin (MULTI-VITAMIN)  tablet Take 1 tablet by mouth daily.    . sacubitril-valsartan (ENTRESTO) 24-26 MG Take 1 tablet by mouth 2 (two) times daily. 60 tablet 5  . [DISCONTINUED] losartan (COZAAR) 25 MG tablet Take 1 tablet (25 mg total) by mouth daily. 30 tablet 0   No current facility-administered medications on file prior to visit.    Review of Systems    She denies chest pain, palpitations, dyspnea, pnd, orthopnea, n, v, dizziness, syncope, edema, weight gain, or early satiety. .   All other systems reviewed and are otherwise negative except as noted above.  Physical Exam    VS:  There were no vitals taken for this visit. , BMI There is no height or weight on file to calculate BMI. GEN: Well nourished, well developed, in no acute distress. HEENT: normal. Neck: Supple, no JVD, carotid bruits, or masses. Cardiac: RRR, no murmurs, rubs, or gallops. No clubbing, cyanosis, edema.  Radials/DP/PT 2+ and equal bilaterally.  Respiratory:  Respirations regular and unlabored, clear to auscultation bilaterally. GI: Soft, nontender, nondistended, BS + x 4. MS: no deformity or atrophy. Skin: warm and dry, no rash. Neuro:  Strength and sensation are intact. Psych: Normal affect.  Accessory Clinical Findings    ECG personally reviewed by me today -NSR, 93 bpm, biatrial enlargement, repolarization abnormalities, T wave abnormalities noted in lateral V5, V6, 1, aVL as seen 3/8 and reviewed today- no acute changes.  VITALS Reviewed today   Temp Readings from Last 3 Encounters:  07/20/20 98.7 F (37.1 C) (Oral)  07/14/20 98.6 F (37 C) (Oral)   BP Readings from Last 3 Encounters:  07/27/20 108/66  07/20/20 122/83  07/14/20 (!) 169/122   Pulse Readings from Last 3 Encounters:  07/27/20 93  07/20/20 91  07/14/20 (!) 120    Wt Readings from Last 3 Encounters:  07/27/20 136 lb (61.7 kg)  07/20/20 144 lb 11.2 oz (65.6 kg)  07/14/20 130 lb (59 kg)     LABS  reviewed today   Lab Results  Component Value  Date   WBC 5.9 07/20/2020   HGB 12.1 07/20/2020   HCT 36.0 07/20/2020   MCV 104.7 (H) 07/20/2020   PLT 215 07/20/2020   Lab Results  Component Value Date   CREATININE 1.01 (H) 07/20/2020   BUN 29 (H) 07/20/2020   NA 141 07/20/2020   K 3.4 (L) 07/20/2020   CL 108 07/20/2020   CO2 25 07/20/2020   Lab Results  Component Value Date   ALT 23 07/20/2020   AST 25 07/20/2020   ALKPHOS 55 07/20/2020   BILITOT 0.7 07/20/2020   Lab Results  Component Value Date   CHOL 246 (H) 07/16/2020   HDL 93 07/16/2020   LDLCALC 143 (H) 07/16/2020   TRIG 50 07/16/2020   CHOLHDL 2.6 07/16/2020    Lab Results  Component Value Date   HGBA1C 5.6 07/16/2020   Lab Results  Component Value Date   TSH 0.799 07/14/2020     STUDIES/PROCEDURES reviewed today   Echo 07/16/20 1. Left ventricular ejection fraction, by estimation, is <20%. Left  ventricular ejection fraction by PLAX is 6 %. The left ventricle has  severely decreased function. The left ventricle demonstrates global  hypokinesis. The left ventricular internal  cavity size was severely dilated. Left ventricular diastolic parameters  are indeterminate.  2. Right ventricular systolic function is normal. The right ventricular  size is normal.  3. Left atrial size was mildly dilated.  4. Mild mitral valve regurgitation.  5. The inferior vena cava is normal in size with <50% respiratory  variability, suggesting right atrial pressure of 8 mmHg.   LHC 07/15/20  Prox RCA lesion is 100% stenosed. CTO  Mid LM lesion is 25% stenosed.  Dist LAD lesion is 95% stenosed.  Prox LAD to Dist LAD lesion is 75% stenosed.  Dist LM to Prox LAD lesion is 50% stenosed.  Prox Cx to Mid Cx lesion is 100% stenosed. In-stent thrombosis of the stent IRA TIMI 0 flow  Moderate collaterals left to right left to left  Left ventricular function severely depressed left ventricular enlargement EF between 25 and 30% globally  Unsuccessful attempted  PCI and stent to ostial old stent to the circumflex with in-stent thrombosis failure to cross with a wire Conclusion STEMI presentation in house late recognition Troponins were greater than 27,000 EKG had diffuse ST elevation inferior laterally at admission and 24 hours later when STEMI was called Diagnostic cardiac cath showed severely depressed left ventricular function ejection fraction was between 25 and 30% with left ventricular enlargement Coronaries Left main large minor distal disease  LAD was large diffuse 50 to  75% proximal to mid, diffuse 75 to 95% mid to distal Circumflex is large ostial stent occluded thrombotic in-stent thrombosis from an old stent TIMI 0 flow RCA medium to small a completely occluded proximally CTO Intervention Unsuccessful attempt at PCI and stent of in-stent thrombosis of old proximal stent to circumflex Unable to cross with a wire Patient developed heart failure type symptoms Treated with Lasix Groin was mynx Patient was transferred to ICU for aggressive medical therapy for heart failure and post MI care Coronary Diagrams   Diagnostic Dominance: Right   Previous echo 2014  PROCEDURE Study Quality: Technically adequate. - SUMMARY The left ventricle is severely dilated. There is normal left ventricular wall thickness.  Left ventricular systolic function is severely reduced. LV ejection fraction = 20-25%. There is severe global hypokinesis of the left ventricle. The right ventricle is normal in size and function. The left atrium is mildly dilated. There is moderate to severe mitral regurgitation. There is moderate tricuspid regurgitation. Mild pulmonary hypertension. Mild pulmonic valvular regurgitation. There is no pericardial effusion. Compared to prior study, significant changes have occured. - FINDINGS:  LEFT VENTRICLE There is normal left ventricular wall thickness. The left ventricle is  severely dilated. Left ventricular  systolic function is severely reduced. LV  ejection fraction = 20-25%. Left ventricular filling pattern is pseudonormal.  Lateral E/E' suggestive of increased filling pressures. There is severe  global hypokinesis of the left ventricle. -  RIGHT VENTRICLE The right ventricle is normal in size and function.  LEFT ATRIUM The left atrium is mildly dilated.  RIGHT ATRIUM  Right atrial size is normal. - AORTIC VALVE The aortic valve is normal in structure and function. The aortic valve is  trileaflet. Trace (trivial) aortic regurgitation. - MITRAL VALVE The mitral valve leaflets appear normal. There is no evidence of stenosis,  fluttering, or prolapse. There is moderate to severe mitral regurgitation. MR  likely function secondary to dilated cardiomyopathy. - TRICUSPID VALVE Structurally normal tricuspid valve. There is moderate tricuspid  regurgitation. Estimated right atrial pressure is 10 mmHg.. Right ventricular  systolic pressure is elevated at 41-26mmHg. Mild pulmonary hypertension. - PULMONIC VALVE The pulmonic valve is not well visualized. Mild pulmonic valvular  regurgitation. - - VENOUS Pulmonary venous flow pattern not well visualized. IVC size was mildly  dilated. - EFFUSION There is no pericardial effusion. - - MMode/2D Measurements & Calculations IVSd: 0.90 cm LVIDd: 6.4 cm LVPWd: 1.0 cm LVIDs: 5.9 cm LA dim: 4.2 cm Ao root: 2.9 cm EDV(MOD-sp4): 143.0 ml ESV(MOD-sp4): 118.0 ml EDV(MOD-sp2): 145.0 ml ESV(MOD-sp2): 119.0 ml LVOT diam: 1.8 cm LA A2 area: 26.1 cm2 LA length (vol): 5.4 cm LA vol: 98.6 ml LA vol index: 57.2 ml/m2 LAA (a4): 23.9 cm2 RAA (a4): 16.6 cm2 Doppler Measurements & Calculations MV E max vel: 127.7 cm/sec MV A max vel: 116.6 cm/sec MV E/A: 1.1 Lat Peak E' Vel: 5.2 cm/sec E/Lat E`: 24.5 MV dec time: 0.16 sec SV(LVOT): 31.1 ml Ao max PG: 5.5 mmHg Ao mean PG: 2.3 mmHg Ao V2 VTI: 12.5 cm AVA (VTI): 2.5 cm2 LV V1 VTI: 12.2  cm TR max vel: 294.1 cm/sec TR max PG: 34.6 mmHg RVSP(TR): 44.6 mmHg RAP systole: 10.0 mmHg   Assessment & Plan    History of delayed STEMI  Coronary artery disease with previous history of STEMI/PCI (2005) --No current chest pain.  Reports improvement in breathing.  High-sensitivity troponin >27,000 during admission with subsequent cath and no intervention. EKG with TWI in lateral leads, which we  discussed and as reviewed with DOD today.  Recommendation for medical management.  Continue GDMIT  ASA 81 mg, beta-blocker, and statin.   [facility]   Monitor BP closely. BP soft today, though with consideration of current Losartan.  Start low dose Entresto as BP and insurance allows. Entresto samples provided, coupon provided.  Start paperwork for pt assistance, given no insurance.   Discontinue Losartan with start of Entresto.  BMET today, RTC.  Check electrolytes  Renal function check  Start Farxiga per GDMT.  Continue statin.  Recheck lipids and LFTs 6-8w.  Aggressive risk factor modification.   Statin  Lifestyle  Smoking cessation recommended.   Very close follow-up recommended with pt agreement  ?  Cardiac rehab.,  Insurance coverage.  No need to investigate cardiac rehab coverage with regards to her insurance.  Chronic HFrEF --SOB reportedly improved from admission. Euvolemic on exam. Echo as above.  EF as above below <20%.    Lifevest in place. Continue.  Continue current medications.  Check BMET, CBC.  Escalate GDMT as tolerated.    Recheck echo once on GDMT / 3-6 mo.  [facility] [place], given she is closer to this location.  History of sinus tachycardia, NSVT, PVCs --Continue Toprol as BP allows with start of Entresto. Given history of ectopy, will keep Toprol at current dose. She knows to call the office if any dizziness.   HTN  --Current BP well controlled to soft.  Continue  current medications as tolerated by BP, given softer pressures.  HLD --Continue statin.  LDL goal below 70.  Tobacco use, current --Smoking cessation recommended.    Medication changes: Entresto. Stop Losartan. Start [employer]iga Labs ordered: BMET, CBC Studies / Imaging ordered: Repeat echo in 3-6 mo Future considerations: Escalate GDMT. ?Spironolactone if BP allows Disposition: RTC in 2 weeks   Lennon AlstromJacquelyn D Radha Coggins, PA-C 08/10/2020

## 2020-08-14 ENCOUNTER — Ambulatory Visit: Payer: Self-pay | Admitting: Physician Assistant

## 2020-08-14 NOTE — Progress Notes (Deleted)
Office Visit    Patient Name: Elizabeth Herring Date of Encounter: 08/14/2020  PCP:  Patient, No Pcp Per (Inactive)   [facility]  Cardiologist:  Julien Nordmann, MD  Advanced Practice Provider:  No care team member to display Electrophysiologist:  None   Chief Complaint    No chief complaint on file.   65 yo female with complex history of CAD, HFrEF, h/o STEMI 2005 with PCI to LCx, HTN, hyperlipidemia, obesity, current tobacco use, orthostatic hypotension, and who is being seen today for follow-up after STEMI s/p catheterization 07/15/2020 without intervention.  Past Medical History    No past medical history on file. Past Surgical History:  Procedure Laterality Date  . CORONARY/GRAFT ACUTE MI REVASCULARIZATION N/A 07/15/2020   Procedure: Coronary/Graft Acute MI Revascularization;  Surgeon: Alwyn Pea, MD;  Location: [facility] INVASIVE CV LAB;  Service: Cardiovascular;  Laterality: N/A;  . LEFT HEART CATH AND CORONARY ANGIOGRAPHY N/A 07/15/2020   Procedure: LEFT HEART CATH AND CORONARY ANGIOGRAPHY;  Surgeon: Alwyn Pea, MD;  Location: [facility] INVASIVE CV LAB;  Service: Cardiovascular;  Laterality: N/A;    Allergies  Allergies  Allergen Reactions  . Chlorhexidine     History of Present Illness    Elizabeth Herring is a 65 y.o. female with PMH as above.   She is a current smoker at 1/2 pack/day (previously smoked 6 cigarettes/day in 2014). She is not currently on any cardiac medications.  She has not followed up with any other cardiologist since 2014 at [facility] as below.  She is retired and formerly worked at a [employer].  She lives with her sister in [place].    She has a previous history of MI with PCI to her left circumflex in 2005. 2005 LHC showed 100% LCx, 70% LAD, 70% RCA, 0% left main disease.    2014 echo performed at [facility] with 2014 EF 20 to 25%, severe global hypokinesis of the LV, mild LAE, moderate to severe  MR, moderate TR, mild pulmonary hypertension, mild pulmonic regurgitation.  She was lost to follow-up after initial cardiology consult 11/2012 [facility] for orthostatic sx.  At that time, she was smoking 6 cigarettes/day with rare alcohol use.  She reported feeling dizzy if standing too quickly.  Her echo was reviewed with recommendation for aggressive risk factor modification as outlined in her care everywhere note.  She was then lost to follow-up and has not continued her cardiac medications.  She presented to [facility] emergency department 07/14/2020 after feeling weak or "off" over the weekend (starting Sunday 3/6), followed by progressive DOE/SOB starting Monday 07/13/20.  She presented to urgent care 3/8 and was then sent to the [facility] ED.  In the ED, she was noted to be using accessory muscles on exam.  She reported progressive shortness of breath, cough, and chest pain at that time. No CP reported - just SOB/DOE. Her cough was reportedly chronic with clear phlegm. Vitals significant for HTN, tachypnea, hypoxia, and sinus tachycardia. She was started on IV ceftriaxone and azithromycin and admitted for suspected bronchitis and right lower lobe pneumonia. Continued respiratory distress noted by RN staff.  IV fluids were then started --->discontinued 3/9 due to pulmonary edema.  Per RN notes, a 26 beat run of V. tach was noted on telemetry later that day (electrolytes with K 2.9).  High-sensitivity troponin then obtained with value greater than 27,000.   EKG showed sinus tachycardia and diffuse ST elevation anterior laterally with changes also noted  on previous EKGs. Code STEMI activated. Subsequent LHC by Dr. Juliann Pares showed moderate to severely depressed LVSF (estimated 25-30%), RCA with CTO, mLM with 25%s, dLAD 95%s, pLAD-dLAD 75%s, dLM-pLAD 50%s, p-mLCx 100%s with in-stent thrombosis of the stent IRA TIMI 0 flow.  Moderate collaterals were noted from left to right and left to left.  Intervention with PCI was  attempted but unsuccessful.  Case was thus aborted with recommendation for medical therapy. After cath, she noted symptoms as significantly improved.  She was discharged with [employer] Life vest and medial therapy. --------  Today, 07/27/2020, she returns to clinic and notes that she has been doing relatively well since discharge.  She is wearing her LifeVest today without any recent discharges at best.  She is weaning off of smoking and reports 5 cigarettes total since leaving the hospital.  She is doing this cold [place] with out any assistance.  Congratulations provided.  No alcohol use.  She reports BP has been relatively well controlled with SBP is normally 127/80 at home.  She denies any chest pain, palpitations, presyncope, syncope, orthopnea, PND, weight gain, early satiety, or any other concerning symptoms.  We reviewed her hospitalization today with recommendation for repeat labs as outlined below.  Her breathing is noted to be significantly improved from that of her previous hospital physical exams.  Volume status noted to be well controlled.  She reports medication compliance.  No signs or symptoms of volume overload.   ---schedule echo  Home Medications    Current Outpatient Medications on File Prior to Visit  Medication Sig Dispense Refill  . aspirin 81 MG EC tablet TAKE ONE TABLET BY MOUTH EVERY DAY 30 tablet 0  . atorvastatin (LIPITOR) 80 MG tablet TAKE ONE TABLET BY MOUTH EVERY DAY 30 tablet 0  . cetirizine (ZYRTEC) 10 MG tablet Take 10 mg by mouth daily.    . clopidogrel (PLAVIX) 75 MG tablet TAKE ONE TABLET BY MOUTH EVERY DAY 30 tablet 0  . dapagliflozin propanediol (FARXIGA) 5 MG TABS tablet Take 1 tablet (5 mg total) by mouth daily before breakfast. 30 tablet 5  . furosemide (LASIX) 40 MG tablet TAKE ONE TABLET BY MOUTH EVERY DAY 30 tablet 0  . metoprolol succinate (TOPROL-XL) 25 MG 24 hr tablet TAKE ONE TABLET BY MOUTH EVERY DAY 30 tablet 0  . Multiple Vitamin (MULTI-VITAMIN)  tablet Take 1 tablet by mouth daily.    . sacubitril-valsartan (ENTRESTO) 24-26 MG Take 1 tablet by mouth 2 (two) times daily. 60 tablet 5  . [DISCONTINUED] losartan (COZAAR) 25 MG tablet Take 1 tablet (25 mg total) by mouth daily. 30 tablet 0   No current facility-administered medications on file prior to visit.    Review of Systems    She denies chest pain, palpitations, dyspnea, pnd, orthopnea, n, v, dizziness, syncope, edema, weight gain, or early satiety. .   All other systems reviewed and are otherwise negative except as noted above.  Physical Exam    VS:  There were no vitals taken for this visit. , BMI There is no height or weight on file to calculate BMI. GEN: Well nourished, well developed, in no acute distress. HEENT: normal. Neck: Supple, no JVD, carotid bruits, or masses. Cardiac: RRR, no murmurs, rubs, or gallops. No clubbing, cyanosis, edema.  Radials/DP/PT 2+ and equal bilaterally.  Respiratory:  Respirations regular and unlabored, clear to auscultation bilaterally. GI: Soft, nontender, nondistended, BS + x 4. MS: no deformity or atrophy. Skin: warm and dry, no rash. Neuro:  Strength and sensation are intact. Psych: Normal affect.  Accessory Clinical Findings    ECG personally reviewed by me today -NSR, 93 bpm, biatrial enlargement, repolarization abnormalities, T wave abnormalities noted in lateral V5, V6, 1, aVL as seen 3/8 and reviewed today- no acute changes.  VITALS Reviewed today   Temp Readings from Last 3 Encounters:  07/20/20 98.7 F (37.1 C) (Oral)  07/14/20 98.6 F (37 C) (Oral)   BP Readings from Last 3 Encounters:  07/27/20 108/66  07/20/20 122/83  07/14/20 (!) 169/122   Pulse Readings from Last 3 Encounters:  07/27/20 93  07/20/20 91  07/14/20 (!) 120    Wt Readings from Last 3 Encounters:  07/27/20 136 lb (61.7 kg)  07/20/20 144 lb 11.2 oz (65.6 kg)  07/14/20 130 lb (59 kg)     LABS  reviewed today   Lab Results  Component Value  Date   WBC 5.9 07/20/2020   HGB 12.1 07/20/2020   HCT 36.0 07/20/2020   MCV 104.7 (H) 07/20/2020   PLT 215 07/20/2020   Lab Results  Component Value Date   CREATININE 1.01 (H) 07/20/2020   BUN 29 (H) 07/20/2020   NA 141 07/20/2020   K 3.4 (L) 07/20/2020   CL 108 07/20/2020   CO2 25 07/20/2020   Lab Results  Component Value Date   ALT 23 07/20/2020   AST 25 07/20/2020   ALKPHOS 55 07/20/2020   BILITOT 0.7 07/20/2020   Lab Results  Component Value Date   CHOL 246 (H) 07/16/2020   HDL 93 07/16/2020   LDLCALC 143 (H) 07/16/2020   TRIG 50 07/16/2020   CHOLHDL 2.6 07/16/2020    Lab Results  Component Value Date   HGBA1C 5.6 07/16/2020   Lab Results  Component Value Date   TSH 0.799 07/14/2020     STUDIES/PROCEDURES reviewed today   Echo 07/16/20 1. Left ventricular ejection fraction, by estimation, is <20%. Left  ventricular ejection fraction by PLAX is 6 %. The left ventricle has  severely decreased function. The left ventricle demonstrates global  hypokinesis. The left ventricular internal  cavity size was severely dilated. Left ventricular diastolic parameters  are indeterminate.  2. Right ventricular systolic function is normal. The right ventricular  size is normal.  3. Left atrial size was mildly dilated.  4. Mild mitral valve regurgitation.  5. The inferior vena cava is normal in size with <50% respiratory  variability, suggesting right atrial pressure of 8 mmHg.   LHC 07/15/20  Prox RCA lesion is 100% stenosed. CTO  Mid LM lesion is 25% stenosed.  Dist LAD lesion is 95% stenosed.  Prox LAD to Dist LAD lesion is 75% stenosed.  Dist LM to Prox LAD lesion is 50% stenosed.  Prox Cx to Mid Cx lesion is 100% stenosed. In-stent thrombosis of the stent IRA TIMI 0 flow  Moderate collaterals left to right left to left  Left ventricular function severely depressed left ventricular enlargement EF between 25 and 30% globally  Unsuccessful attempted  PCI and stent to ostial old stent to the circumflex with in-stent thrombosis failure to cross with a wire Conclusion STEMI presentation in house late recognition Troponins were greater than 27,000 EKG had diffuse ST elevation inferior laterally at admission and 24 hours later when STEMI was called Diagnostic cardiac cath showed severely depressed left ventricular function ejection fraction was between 25 and 30% with left ventricular enlargement Coronaries Left main large minor distal disease  LAD was large diffuse 50 to  75% proximal to mid, diffuse 75 to 95% mid to distal Circumflex is large ostial stent occluded thrombotic in-stent thrombosis from an old stent TIMI 0 flow RCA medium to small a completely occluded proximally CTO Intervention Unsuccessful attempt at PCI and stent of in-stent thrombosis of old proximal stent to circumflex Unable to cross with a wire Patient developed heart failure type symptoms Treated with Lasix Groin was mynx Patient was transferred to ICU for aggressive medical therapy for heart failure and post MI care Coronary Diagrams   Diagnostic Dominance: Right   Previous echo 2014  PROCEDURE Study Quality: Technically adequate. - SUMMARY The left ventricle is severely dilated. There is normal left ventricular wall thickness.  Left ventricular systolic function is severely reduced. LV ejection fraction = 20-25%. There is severe global hypokinesis of the left ventricle. The right ventricle is normal in size and function. The left atrium is mildly dilated. There is moderate to severe mitral regurgitation. There is moderate tricuspid regurgitation. Mild pulmonary hypertension. Mild pulmonic valvular regurgitation. There is no pericardial effusion. Compared to prior study, significant changes have occured. - FINDINGS:  LEFT VENTRICLE There is normal left ventricular wall thickness. The left ventricle is  severely dilated. Left ventricular  systolic function is severely reduced. LV  ejection fraction = 20-25%. Left ventricular filling pattern is pseudonormal.  Lateral E/E' suggestive of increased filling pressures. There is severe  global hypokinesis of the left ventricle. -  RIGHT VENTRICLE The right ventricle is normal in size and function.  LEFT ATRIUM The left atrium is mildly dilated.  RIGHT ATRIUM  Right atrial size is normal. - AORTIC VALVE The aortic valve is normal in structure and function. The aortic valve is  trileaflet. Trace (trivial) aortic regurgitation. - MITRAL VALVE The mitral valve leaflets appear normal. There is no evidence of stenosis,  fluttering, or prolapse. There is moderate to severe mitral regurgitation. MR  likely function secondary to dilated cardiomyopathy. - TRICUSPID VALVE Structurally normal tricuspid valve. There is moderate tricuspid  regurgitation. Estimated right atrial pressure is 10 mmHg.. Right ventricular  systolic pressure is elevated at 41-26mmHg. Mild pulmonary hypertension. - PULMONIC VALVE The pulmonic valve is not well visualized. Mild pulmonic valvular  regurgitation. - - VENOUS Pulmonary venous flow pattern not well visualized. IVC size was mildly  dilated. - EFFUSION There is no pericardial effusion. - - MMode/2D Measurements & Calculations IVSd: 0.90 cm LVIDd: 6.4 cm LVPWd: 1.0 cm LVIDs: 5.9 cm LA dim: 4.2 cm Ao root: 2.9 cm EDV(MOD-sp4): 143.0 ml ESV(MOD-sp4): 118.0 ml EDV(MOD-sp2): 145.0 ml ESV(MOD-sp2): 119.0 ml LVOT diam: 1.8 cm LA A2 area: 26.1 cm2 LA length (vol): 5.4 cm LA vol: 98.6 ml LA vol index: 57.2 ml/m2 LAA (a4): 23.9 cm2 RAA (a4): 16.6 cm2 Doppler Measurements & Calculations MV E max vel: 127.7 cm/sec MV A max vel: 116.6 cm/sec MV E/A: 1.1 Lat Peak E' Vel: 5.2 cm/sec E/Lat E`: 24.5 MV dec time: 0.16 sec SV(LVOT): 31.1 ml Ao max PG: 5.5 mmHg Ao mean PG: 2.3 mmHg Ao V2 VTI: 12.5 cm AVA (VTI): 2.5 cm2 LV V1 VTI: 12.2  cm TR max vel: 294.1 cm/sec TR max PG: 34.6 mmHg RVSP(TR): 44.6 mmHg RAP systole: 10.0 mmHg   Assessment & Plan    History of delayed STEMI  Coronary artery disease with previous history of STEMI/PCI (2005) --No current chest pain.  Reports improvement in breathing.  High-sensitivity troponin >27,000 during admission with subsequent cath and no intervention. EKG with TWI in lateral leads, which we  discussed and as reviewed with DOD today.  Recommendation for medical management.  Continue GDMIT  ASA 81 mg, beta-blocker, and statin.   [facility] to [facility].   Monitor BP closely. BP soft today, though with consideration of current Losartan.  Start low dose Entresto as BP and insurance allows. Entresto samples provided, coupon provided.  Start paperwork for pt assistance, given no insurance.   Discontinue Losartan with start of Entresto.  BMET today, RTC.  Check electrolytes  Renal function check  Start Farxiga per GDMT.  Continue statin.  Recheck lipids and LFTs 6-8w.  Aggressive risk factor modification.   Statin  Lifestyle  Smoking cessation recommended.   Very close follow-up recommended with pt agreement  ?  Cardiac rehab.,  Insurance coverage.  No need to investigate cardiac rehab coverage with regards to her insurance.  Chronic HFrEF --SOB reportedly improved from admission. Euvolemic on exam. Echo as above.  EF as above below <20%.    Lifevest in place. Continue.  Continue current medications.  Check BMET, CBC.  Escalate GDMT as tolerated.    Recheck echo once on GDMT / 3-6 mo.  Plans for OP follow-up with [facility] in [place], given she is closer to this location.  History of sinus tachycardia, NSVT, PVCs --Continue Toprol as BP allows with start of Entresto. Given history of ectopy, will keep Toprol at current dose. She knows to call the office if any dizziness.   HTN  --Current BP well controlled to soft.  Continue  current medications as tolerated by BP, given softer pressures.  HLD --Continue statin.  LDL goal below 70.  Tobacco use, current --Smoking cessation recommended.    Medication changes: Entresto. Stop Losartan. Start [employer]xiga Labs ordered: BMET, CBC Studies / Imaging ordered: Repeat echo in 3-6 mo Future considerations: Escalate GDMT. ?Spironolactone if BP allows Disposition: RTC in 2 weeks   Lennon AlstromJacquelyn D Saulo Anthis, PA-C 08/14/2020

## 2020-08-17 ENCOUNTER — Encounter: Payer: Self-pay | Admitting: Physician Assistant

## 2020-08-17 NOTE — Progress Notes (Deleted)
   Patient ID: Elizabeth Herring, female    DOB: 1955/07/18, 65 y.o.   MRN: [id number]  HPI  Elizabeth Herring is a 65 y/o female with a history of  Echo report from 07/16/20 reviewed and showed an EF of <20% along with mild LAE and mild MR.   LHC done 07/15/20 and showed:  Prox RCA lesion is 100% stenosed. CTO  Mid LM lesion is 25% stenosed.  Dist LAD lesion is 95% stenosed.  Prox LAD to Dist LAD lesion is 75% stenosed.  Dist LM to Prox LAD lesion is 50% stenosed.  Prox Cx to Mid Cx lesion is 100% stenosed. In-stent thrombosis of the stent IRA TIMI 0 flow  Moderate collaterals left to right left to left  Left ventricular function severely depressed left ventricular enlargement EF between 25 and 30% globally  Unsuccessful attempted PCI and stent to ostial old stent to the circumflex with in-stent thrombosis failure to cross with a wire  Admitted 07/14/20 due to shortness of breath & hypoxia due to pneumonia. Cardiology consult obtained. IV antibiotics given. Noted for STEMI. Placed on IV heparin and cath completed but unable to do PCI. Lifevest placed. Discharged after 6 days.   She presents today for her initial visit with a chief complaint of   Review of Systems    Physical Exam  Assessment & Plan:  1: Chronic heart failure with reduced ejection fraction- - NYHA class  - BNP 07/16/20 was 767.9  2: HTN- - BP - BMP 07/20/20 reviewed and showed sodium 141, potassium 3.4, creatinine 1.01 and GFR >60  3: CAD- - saw cardiology Elizabeth Herring) 07/27/20  4: Tobacco use-

## 2020-08-18 ENCOUNTER — Ambulatory Visit: Payer: Medicaid Other | Admitting: Family

## 2020-08-26 ENCOUNTER — Other Ambulatory Visit: Payer: Self-pay

## 2020-08-26 ENCOUNTER — Telehealth: Payer: Self-pay | Admitting: Cardiovascular Disease

## 2020-08-26 MED ORDER — DAPAGLIFLOZIN PROPANEDIOL 5 MG PO TABS: 5.0000 mg | ORAL_TABLET | Freq: Every day | ORAL | 5 refills | Status: DC

## 2020-08-26 MED ORDER — ATORVASTATIN CALCIUM 80 MG PO TABS: 1.0000 | ORAL_TABLET | Freq: Every day | ORAL | 0 refills | Status: DC

## 2020-08-26 MED ORDER — CLOPIDOGREL BISULFATE 75 MG PO TABS: 1.0000 | ORAL_TABLET | Freq: Every day | ORAL | 5 refills | Status: DC

## 2020-08-26 MED ORDER — ENTRESTO 24-26 MG PO TABS: 1.0000 | ORAL_TABLET | Freq: Two times a day (BID) | ORAL | 5 refills | Status: DC

## 2020-08-26 MED ORDER — METOPROLOL SUCCINATE ER 25 MG PO TB24: 1.0000 | ORAL_TABLET | Freq: Every day | ORAL | 5 refills | Status: DC

## 2020-08-26 MED ORDER — FUROSEMIDE 40 MG PO TABS: ORAL_TABLET | Freq: Every day | ORAL | 5 refills | Status: DC

## 2020-08-26 NOTE — Telephone Encounter (Signed)
Rx requests sent to pharmacy 

## 2020-08-26 NOTE — Telephone Encounter (Signed)
*  STAT* If patient is at the pharmacy, call can be transferred to refill team.   1. Which medications need to be refilled? (please list name of each medication and dose if known)  Entresto 24-26 MG 1 tablet 2 times daily  Metoprolol succinate 25 MG 1 tablet daily Furosemide 40 MG 1 tablet daily Farxiga 5 MG 1 tablet daily Lipitor 80 MG 1 tablet daily Plavix 75 MG 1 tablet daily   2. Which pharmacy/location (including street and city if local pharmacy) is medication to be sent to? [facility] in [place]   3. Do they need a 30 day or 90 day supply? 90 day

## 2020-08-27 ENCOUNTER — Other Ambulatory Visit: Payer: Self-pay

## 2020-08-27 MED ORDER — DAPAGLIFLOZIN PROPANEDIOL 5 MG PO TABS
5.0000 mg | ORAL_TABLET | Freq: Every day | ORAL | 1 refills | Status: DC
  Filled 2020-08-27: qty 30, 30d supply, fill #0

## 2020-08-27 MED ORDER — FUROSEMIDE 40 MG PO TABS
ORAL_TABLET | Freq: Every day | ORAL | 1 refills | Status: DC
  Filled 2020-08-27: qty 90, 90d supply, fill #0

## 2020-08-27 MED ORDER — ENTRESTO 24-26 MG PO TABS
1.0000 | ORAL_TABLET | Freq: Two times a day (BID) | ORAL | 1 refills | Status: DC
  Filled 2020-08-27: qty 180, 90d supply, fill #0

## 2020-08-27 MED ORDER — ASPIRIN 81 MG PO TBEC
DELAYED_RELEASE_TABLET | Freq: Every day | ORAL | 1 refills | Status: DC
  Filled 2020-08-27: qty 90, 90d supply, fill #0
  Filled 2020-11-19: qty 90, 90d supply, fill #1

## 2020-08-27 MED ORDER — CLOPIDOGREL BISULFATE 75 MG PO TABS
1.0000 | ORAL_TABLET | Freq: Every day | ORAL | 1 refills | Status: DC
  Filled 2020-08-27: qty 90, 90d supply, fill #0
  Filled 2020-11-19: qty 90, 90d supply, fill #1

## 2020-08-27 MED ORDER — ATORVASTATIN CALCIUM 80 MG PO TABS
1.0000 | ORAL_TABLET | Freq: Every day | ORAL | 1 refills | Status: DC
  Filled 2020-08-27: qty 90, 90d supply, fill #0
  Filled 2020-11-19: qty 90, 90d supply, fill #1

## 2020-08-27 MED ORDER — METOPROLOL SUCCINATE ER 25 MG PO TB24
1.0000 | ORAL_TABLET | Freq: Every day | ORAL | 1 refills | Status: DC
  Filled 2020-08-27: qty 90, 90d supply, fill #0
  Filled 2020-11-19: qty 30, 30d supply, fill #1
  Filled 2020-12-21: qty 30, 30d supply, fill #2
  Filled 2021-01-23: qty 30, 30d supply, fill #3

## 2020-08-27 NOTE — Telephone Encounter (Signed)
Called patient and inquired how she was able to get her medications the previous time prior to this refill and she stated that she went to medication management for them. I sent new prescriptions to medication management. Patient informed this is the only pharmacy that has the financial aid program and all of her refills need to come from there. Patient verbalized understanding and agreed with plan.

## 2020-08-27 NOTE — Addendum Note (Signed)
Addended by: Gibson Ramp on: 08/27/2020 03:19 PM   Modules accepted: Orders

## 2020-08-27 NOTE — Telephone Encounter (Signed)
Patient calling back in stating that [facility] made her aware that her medicaid  insurance will not be covering her medication at all. Patient is unsure of what to do moving forward

## 2020-08-28 ENCOUNTER — Other Ambulatory Visit: Payer: Self-pay

## 2020-08-28 ENCOUNTER — Ambulatory Visit (INDEPENDENT_AMBULATORY_CARE_PROVIDER_SITE_OTHER): Payer: Self-pay | Admitting: Family

## 2020-08-28 ENCOUNTER — Encounter: Payer: Self-pay | Admitting: Family

## 2020-08-28 ENCOUNTER — Other Ambulatory Visit
Admission: RE | Admit: 2020-08-28 | Discharge: 2020-08-28 | Disposition: A | Discharge status: Home / Self Care | Payer: Medicaid Other | Source: Ambulatory Visit | Attending: Family | Admitting: Family

## 2020-08-28 VITALS — BP 146/90 | HR 111 | Ht 60.0 in | Wt 139.0 lb

## 2020-08-28 DIAGNOSIS — E785 Hyperlipidemia, unspecified: Secondary | ICD-10-CM | POA: Insufficient documentation

## 2020-08-28 DIAGNOSIS — I25119 Atherosclerotic heart disease of native coronary artery with unspecified angina pectoris: Secondary | ICD-10-CM | POA: Insufficient documentation

## 2020-08-28 DIAGNOSIS — R000 Tachycardia, unspecified: Secondary | ICD-10-CM

## 2020-08-28 DIAGNOSIS — I493 Ventricular premature depolarization: Secondary | ICD-10-CM

## 2020-08-28 DIAGNOSIS — I4729 Other ventricular tachycardia: Secondary | ICD-10-CM

## 2020-08-28 DIAGNOSIS — I5020 Unspecified systolic (congestive) heart failure: Secondary | ICD-10-CM

## 2020-08-28 DIAGNOSIS — I10 Essential (primary) hypertension: Secondary | ICD-10-CM

## 2020-08-28 DIAGNOSIS — Z720 Tobacco use: Secondary | ICD-10-CM

## 2020-08-28 DIAGNOSIS — I472 Ventricular tachycardia: Secondary | ICD-10-CM

## 2020-08-28 LAB — LIPID PANEL
Cholesterol: 207 mg/dL — ABNORMAL HIGH (ref 0–200)
HDL: 75 mg/dL (ref >40)
LDL Cholesterol: 120 mg/dL — ABNORMAL HIGH (ref 0–99)
Total CHOL/HDL Ratio: 2.8 RATIO
Triglycerides: 61 mg/dL (ref <150)
VLDL: 12 mg/dL (ref 0–40)

## 2020-08-28 LAB — COMPREHENSIVE METABOLIC PANEL
ALT: 13 U/L (ref 0–44)
AST: 23 U/L (ref 15–41)
Albumin: 3.9 g/dL (ref 3.5–5.0)
Alkaline Phosphatase: 67 U/L (ref 38–126)
Anion gap: 10 (ref 5–15)
BUN: 12 mg/dL (ref 8–23)
CO2: 22 mmol/L (ref 22–32)
Calcium: 9.3 mg/dL (ref 8.9–10.3)
Chloride: 105 mmol/L (ref 98–111)
Creatinine, Ser: 0.8 mg/dL (ref 0.44–1.00)
GFR, Estimated: >60 mL/min (ref >60)
Glucose, Bld: 165 mg/dL — ABNORMAL HIGH (ref 70–99)
Potassium: 4 mmol/L (ref 3.5–5.1)
Sodium: 137 mmol/L (ref 135–145)
Total Bilirubin: 1.1 mg/dL (ref 0.3–1.2)
Total Protein: 7.4 g/dL (ref 6.5–8.1)

## 2020-08-28 LAB — LDL CHOLESTEROL, DIRECT: Direct LDL: 121.3 mg/dL — ABNORMAL HIGH (ref 0–99)

## 2020-08-28 MED ORDER — SPIRONOLACTONE 25 MG PO TABS
12.5000 mg | ORAL_TABLET | Freq: Every day | ORAL | 3 refills | Status: DC
  Filled 2020-08-28: qty 45, 90d supply, fill #0
  Filled 2020-11-19: qty 45, 90d supply, fill #1

## 2020-08-28 MED ORDER — DAPAGLIFLOZIN PROPANEDIOL 10 MG PO TABS
10.0000 mg | ORAL_TABLET | Freq: Every day | ORAL | 3 refills | Status: DC
  Filled 2020-08-28: qty 90, 90d supply, fill #0

## 2020-08-28 MED ORDER — FUROSEMIDE 20 MG PO TABS
20.0000 mg | ORAL_TABLET | Freq: Every day | ORAL | 3 refills | Status: DC
  Filled 2020-08-28: qty 90, 90d supply, fill #0
  Filled 2020-11-19 – 2020-12-05 (×2): qty 90, 90d supply, fill #1

## 2020-08-28 NOTE — Patient Instructions (Addendum)
Medication Instructions:  Your provider has recommended you make the following change in your medication:   CHANGE Farxiga to 10mg  once daily  START Spironolactone 12.5mg  (half tablet) once daily  CHANGE Furosemide (Lasix) to 20mg  daily  *If you need a refill on your cardiac medications before your next appointment, please call your pharmacy*   Lab Work: Your provider recommendslab work today: BMP, BNP, lipid panel, direct LDL  Your physician recommends that you return for lab work in 1 week at the for Entrance at [facility] 1st desk on the right to check in, past the screening table Lab hours: Monday- Friday (7:30 am- 5:30 pm)  If you have labs (blood work) drawn today and your tests are completely normal, you will receive your results only by: Monday MyChart Message (if you have MyChart) OR . A paper copy in the mail If you have any lab test that is abnormal or we need to change your treatment, we will call you to review the results.   Testing/Procedures: Your EKG today was stable compared to previous  Your physician has requested that you have an echocardiogram in June. Echocardiography is a painless test that uses sound waves to create images of your heart. It provides your doctor with information about the size and shape of your heart and how well your heart's chambers and valves are working. This procedure takes approximately one hour. There are no restrictions for this procedure.   Follow-Up: At [facility], you and your health needs are our priority.  As part of our continuing mission to provide you with exceptional heart care, we have created designated Provider Care Teams.  These Care Teams include your primary Cardiologist (physician) and Advanced Practice Providers (APPs -  Physician Assistants and Nurse Practitioners) who all work together to provide you with the care you need, when you need it.  We recommend signing up for the patient portal  called "MyChart".  Sign up information is provided on this After Visit Summary.  MyChart is used to connect with patients for Virtual Visits (Telemedicine).  Patients are able to view lab/test results, encounter notes, upcoming appointments, etc.  Non-urgent messages can be sent to your provider as well.   To learn more about what you can do with MyChart, go to July.    Your next appointment:   1 month(s)  The format for your next appointment:   In Person  Provider:   You may see [facility], MD or one of the following Advanced Practice Providers on your designated Care Team:    [email or web address], NP  Julien Nordmann, PA-C  Nicolasa Ducking, PA-C  Cadence Eula Listen, [name]  Fransico Michael, NP  Other Instructions  Heart Healthy Diet Recommendations: A low-salt diet is recommended. Meats should be grilled, baked, or boiled. Avoid fried foods. Focus on lean protein sources like fish or chicken with vegetables and fruits. The American Heart Association is a [place]!  American Heart Association Diet and Lifeystyle Recommendations   Exercise recommendations: The American Heart Association recommends 150 minutes of moderate intensity exercise weekly. Try 30 minutes of moderate intensity exercise 4-5 times per week. This could include walking, jogging, or swimming.  Recommend weighing daily and keeping a log. Please call our office if you have weight gain of 2 pounds overnight or 5 pounds in 1 week.   . Recommend establishing with a primary care provider.  o You may call [facility] @ [phone] for a list  of primary care providers in your area or visit their website [email or web address] o Please have any insurance card available before calling or going online.  o You may also use [employer], [facility], or [employer]

## 2020-08-28 NOTE — Progress Notes (Signed)
Office Visit    Patient Name: Elizabeth Herring Date of Encounter: 08/28/2020  PCP:  Aviva KluverPcp, No   [facility]  Cardiologist:  Julien Nordmannimothy Gollan, MD  Advanced Practice Provider:  No care team member to display Electrophysiologist:  None   Chief Complaint    Elizabeth Herring is a 65 y.o. female with a hx of CAD s/p STEMI 2005 with PCI to LCx and STEMI 07/15/20 without intervention, HTN, HLD, tobacco use, orthostatic hypotension, obesity, HFrEF presents today for follow up of CAD and HFrEF  Past Medical History    History reviewed. No pertinent past medical history. Past Surgical History:  Procedure Laterality Date  . CORONARY/GRAFT ACUTE MI REVASCULARIZATION N/A 07/15/2020   Procedure: Coronary/Graft Acute MI Revascularization;  Surgeon: Alwyn Peaallwood, Dwayne D, MD;  Location: [facility] INVASIVE CV LAB;  Service: Cardiovascular;  Laterality: N/A;  . LEFT HEART CATH AND CORONARY ANGIOGRAPHY N/A 07/15/2020   Procedure: LEFT HEART CATH AND CORONARY ANGIOGRAPHY;  Surgeon: Alwyn Peaallwood, Dwayne D, MD;  Location: [facility] INVASIVE CV LAB;  Service: Cardiovascular;  Laterality: N/A;    Allergies  Allergies  Allergen Reactions  . Chlorhexidine     History of Present Illness    Elizabeth Herring is a 65 y.o. female with a hx of CAD s/p STEMI 2005 with PCI to LCx and STEMI 07/15/20 without intervention, HTN, HLD, tobacco use, orthostatic hypotension, obesity, HFrEF last seen 07/27/20 by J.Visser, PA.  Coronary artery disease dates back to 2005 with MI and PCI to LCx. Catheterization at that time showed 100% LCx, 70% LAD, 70% RCA, 0% left main disease. [facility] showing EF 20-25%, severe global hypokinesis of LV, mild LAD, moderate to severe MR, moderate TR, mild PAH, mild PR. She was subsequently lost to follow up and discontinued her cardiac medications.   [facility]14/20 due to feeling weak, dyspneic. She had runs  of VT on telemetry and HS-troponin >27,000. She underwent LHC with Dr. Juliann Paresallwood with moderately to severely depressed LVEF (25-30%), RCA with CTO, mLM 25%, dLAD 95%, pLAD-dLAD 75%, dLM-pLAD 50%, p-mLCx 100% with in stent thrombosis. MOderate collaterals noted from left to right and left to left. Intervention with PCI was attempted but unsuccessful. Recommended for medical therapy. She was discharged with Zoll LifeVest.  She was seen in follow up 07/27/20. She was weaning cigarettes and wearing Zoll Life Vest. Reported compliance with her medications and assistance through [facility] was obtained.   Presents today for follow up. Tells me all her medications ran out Wednesday, two days ago, but otherwise is compliant. She [facility] her medication [facility] and is picking up when she leaves our visit. She is not having chest pain, pressure, tightness. She has been walking 20 minutes for exercise daily. Tells me her dyspnea on exertion is improving. No orthopnea, PND, edema. Her sister helps her prepare meals. She has not been adding or cooking with salt. She drinks one cup of coffee in the morning and then water throughout the day. She drink about 3 eight oz glasses of water. Her blood pressure at home 125-128/90.   EKGs/Labs/Other Studies Reviewed:   The following studies were reviewed today:  Echo 07/16/20  1. Left ventricular ejection fraction, by estimation, is <20%. Left  ventricular ejection fraction by PLAX is 6 %. The left ventricle has  severely decreased function. The left ventricle demonstrates global  hypokinesis. The left ventricular internal  cavity size was severely  dilated. Left ventricular diastolic parameters  are indeterminate.   2. Right ventricular systolic function is normal. The right ventricular  size is normal.   3. Left atrial size was mildly dilated.   4. Mild mitral valve regurgitation.   5. The inferior vena cava is normal in size with  <50% respiratory  variability, suggesting right atrial pressure of 8 mmHg.    LHC 07/15/20  Prox RCA lesion is 100% stenosed. CTO  Mid LM lesion is 25% stenosed.  Dist LAD lesion is 95% stenosed.  Prox LAD to Dist LAD lesion is 75% stenosed.  Dist LM to Prox LAD lesion is 50% stenosed.  Prox Cx to Mid Cx lesion is 100% stenosed. In-stent thrombosis of the stent IRA TIMI 0 flow  Moderate collaterals left to right left to left  Left ventricular function severely depressed left ventricular enlargement EF between 25 and 30% globally  Unsuccessful attempted PCI and stent to ostial old stent to the circumflex with in-stent thrombosis failure to cross with a wire Conclusion STEMI presentation in house late recognition Troponins were greater than 27,000 EKG had diffuse ST elevation inferior laterally at admission and 24 hours later when STEMI was called Diagnostic cardiac cath showed severely depressed left ventricular function ejection fraction was between 25 and 30% with left ventricular enlargement Coronaries Left main large minor distal disease  LAD was large diffuse 50 to 75% proximal to mid, diffuse 75 to 95% mid to distal Circumflex is large ostial stent occluded thrombotic in-stent thrombosis from an old stent TIMI 0 flow RCA medium to small a completely occluded proximally CTO Intervention Unsuccessful attempt at PCI and stent of in-stent thrombosis of old proximal stent to circumflex Unable to cross with a wire Patient developed heart failure type symptoms Treated with Lasix Groin was mynx Patient was transferred to ICU for aggressive medical therapy for heart failure and post MI care Coronary Diagrams     Diagnostic Dominance: Right    Previous echo 2014   PROCEDURE Study Quality: Technically adequate. - SUMMARY The left ventricle is severely dilated. There is normal left ventricular wall thickness.  Left ventricular systolic function is severely reduced. LV  ejection fraction = 20-25%. There is severe global hypokinesis of the left ventricle. The right ventricle is normal in size and function. The left atrium is mildly dilated. There is moderate to severe mitral regurgitation. There is moderate tricuspid regurgitation. Mild pulmonary hypertension. Mild pulmonic valvular regurgitation. There is no pericardial effusion. Compared to prior study, significant changes have occured. - FINDINGS:  LEFT VENTRICLE There is normal left ventricular wall thickness. The left ventricle is  severely dilated. Left ventricular systolic function is severely reduced. LV  ejection fraction = 20-25%. Left ventricular filling pattern is pseudonormal.  Lateral E/E' suggestive of increased filling pressures. There is severe  global hypokinesis of the left ventricle. -  RIGHT VENTRICLE The right ventricle is normal in size and function.  LEFT ATRIUM The left atrium is mildly dilated.  RIGHT ATRIUM  Right atrial size is normal. - AORTIC VALVE The aortic valve is normal in structure and function. The aortic valve is  trileaflet. Trace (trivial) aortic regurgitation. - MITRAL VALVE The mitral valve leaflets appear normal. There is no evidence of stenosis,  fluttering, or prolapse. There is moderate to severe mitral regurgitation. MR  likely function secondary to dilated cardiomyopathy. - TRICUSPID VALVE Structurally normal tricuspid valve. There is moderate tricuspid  regurgitation. Estimated right atrial pressure is 10 mmHg.. Right ventricular  systolic pressure  is elevated at 41-63mmHg. Mild pulmonary hypertension. - PULMONIC VALVE The pulmonic valve is not well visualized. Mild pulmonic valvular  regurgitation. - - VENOUS Pulmonary venous flow pattern not well visualized. IVC size was mildly  dilated. - EFFUSION There is no pericardial effusion. - - MMode/2D Measurements & Calculations IVSd: 0.90 cm LVIDd: 6.4 cm LVPWd: 1.0 cm LVIDs:  5.9 cm LA dim: 4.2 cm Ao root: 2.9 cm EDV(MOD-sp4): 143.0 ml ESV(MOD-sp4): 118.0 ml EDV(MOD-sp2): 145.0 ml ESV(MOD-sp2): 119.0 ml LVOT diam: 1.8 cm LA A2 area: 26.1 cm2 LA length (vol): 5.4 cm LA vol: 98.6 ml LA vol index: 57.2 ml/m2 LAA (a4): 23.9 cm2 RAA (a4): 16.6 cm2 Doppler Measurements & Calculations MV E max vel: 127.7 cm/sec MV A max vel: 116.6 cm/sec MV E/A: 1.1 Lat Peak E' Vel: 5.2 cm/sec E/Lat E`: 24.5 MV dec time: 0.16 sec SV(LVOT): 31.1 ml Ao max PG: 5.5 mmHg Ao mean PG: 2.3 mmHg Ao V2 VTI: 12.5 cm AVA (VTI): 2.5 cm2 LV V1 VTI: 12.2 cm TR max vel: 294.1 cm/sec TR max PG: 34.6 mmHg RVSP(TR): 44.6 mmHg RAP systole: 10.0 mmHg   EKG:  EKG is ordered today.  The ekg ordered today demonstrates ST 111 bpm, left atrial enlargement, LVH with TWI in lateral leads which is stable compared to previous.   Recent Labs: 07/14/2020: TSH 0.799 07/16/2020: B Natriuretic Peptide 767.9 07/20/2020: Hemoglobin 12.1; Magnesium 2.0; Platelets 215 08/28/2020: ALT 13; BUN 12; Creatinine, Ser 0.80; Potassium 4.0; Sodium 137  Recent Lipid Panel    Component Value Date/Time   CHOL 207 (H) 08/28/2020 1549   TRIG 61 08/28/2020 1549   HDL 75 08/28/2020 1549   CHOLHDL 2.8 08/28/2020 1549   VLDL 12 08/28/2020 1549   LDLCALC 120 (H) 08/28/2020 1549    Home Medications   Current Meds  Medication Sig  . aspirin 81 MG EC tablet TAKE ONE TABLET BY MOUTH EVERY DAY  . atorvastatin (LIPITOR) 80 MG tablet TAKE ONE TABLET BY MOUTH EVERY DAY  . cetirizine (ZYRTEC) 10 MG tablet Take 10 mg by mouth daily.  . clopidogrel (PLAVIX) 75 MG tablet TAKE ONE TABLET BY MOUTH EVERY DAY  . metoprolol succinate (TOPROL-XL) 25 MG 24 hr tablet TAKE ONE TABLET BY MOUTH EVERY DAY  . Multiple Vitamin (MULTI-VITAMIN) tablet Take 1 tablet by mouth daily.  . sacubitril-valsartan (ENTRESTO) 24-26 MG Take 1 tablet by mouth 2 (two) times daily.  [name] spironolactone (ALDACTONE) 25 MG tablet Take 0.5 tablets (12.5 mg  total) by mouth daily.  . [DISCONTINUED] dapagliflozin propanediol (FARXIGA) 5 MG TABS tablet Take 1 tablet (5 mg total) by mouth daily before breakfast.  . [DISCONTINUED] furosemide (LASIX) 40 MG tablet TAKE ONE TABLET BY MOUTH EVERY DAY     Review of Systems  All other systems reviewed and are otherwise negative except as noted above.  Physical Exam    VS:  BP (!) 146/90 (BP Location: Left Arm, Patient Position: Sitting, Cuff Size: Normal)   Pulse (!) 111   Ht 5' (1.524 m)   Wt 139 lb (63 kg)   SpO2 93%   BMI 27.15 kg/m  , BMI Body mass index is 27.15 kg/m.  Wt Readings from Last 3 Encounters:  08/28/20 139 lb (63 kg)  07/27/20 136 lb (61.7 kg)  07/20/20 144 lb 11.2 oz (65.6 kg)    GEN: Well nourished, well developed, in no acute distress. Wearing LifeVest. HEENT: normal. Neck: Supple, no JVD, carotid bruits, or masses. Cardiac: RRR, no murmurs, rubs,  or gallops. No clubbing, cyanosis, edema.  Radials/PT 2+ and equal bilaterally.  Respiratory:  Respirations regular and unlabored, clear to auscultation bilaterally. GI: Soft, nontender, nondistended. MS: No deformity or atrophy. Skin: Warm and dry, no rash. Neuro:  Strength and sensation are intact. Psych: Normal affect.  Assessment & Plan    1. CAD - Stable with no anginal symptoms. Cath 07/2020 with unsuccessful PCI. GDMT includes aspirin, statin, beta blocker. Heart healthy diet and regular cardiovascular exercise encouraged.   2. HFrEF - Euvolemic and well compensated on exam. DOE improving with daily walking regimen. NYHA II. GDMT includes Entresto 24-26mg  BID and Toprol 25mg  daily. Increase Farxiga to 10mg  daily to get to goal dose. She was provided 2 boxes of samples. Add Spironolactone to 12.5mg  daily. Reduce Lasix to 20mg  daily to prevent over-diuresis. Low salt diet, fluid restriction encouraged. Plan for echo 10/2020  As she will have completed 3 months of GDMT. If LVEF remains <35%, refer to EP for consideration of  ICD. She will ontinue to wear LifeVest until that time. At follow up, up-titrate GDMT as BP allows - consider increased dose Spironolactone or Entresto. CMP today for monitoring of renal function and electrolytes and repeat BMP in 1 week.  3. ST / NSVT / PVC - EKG today ST in the setting of being off Toprol. Resume Toprol 25mg  daily, she will pick up from pharmacy when she leaves today. Denies palpitations. Continues to wear Life Vest. If HR remains elevated, consider increased dose Toprol at follow up.   4. HTN - BP mildly elevated in setting of being off medications for three days. She will resume her Toprol 20mg  daily, Entresto 24-26mg  BID.  5. HLD, LDL goal <70 - Continue Atorvastatin 80mg  daily. Lipid panel, direct LDL, CMP today. If lipids not at goal, plan to add Zetia 10mg  daily.  6. Tobacco use - Continued weaning of cigarettes encouraged. Smoking cessation encouraged. Recommend utilization of 1800QUITNOW.  Disposition: Follow up in 1 month(s) with Dr. or APP   Signed, , NP 08/28/2020, 4:35 PM [facility]

## 2020-08-29 ENCOUNTER — Other Ambulatory Visit: Payer: Self-pay

## 2020-08-31 ENCOUNTER — Other Ambulatory Visit: Payer: Self-pay

## 2020-08-31 ENCOUNTER — Telehealth: Payer: Self-pay | Admitting: Family

## 2020-08-31 ENCOUNTER — Telehealth: Payer: Self-pay | Admitting: *Deleted

## 2020-08-31 NOTE — Telephone Encounter (Signed)
Patient states she has some refills that she did not receive, she is not sure what she did not receive, but she did receive Aspirin  Lipitor Plavix Torprol XL Samples of Fargixa,  which she cannot take, makes her tired, and she has a hard time breathing, and has had diarrhea since taking this medication.  States she did not get Entresto Lasix Aldactone  Please call to discuss her medications.

## 2020-08-31 NOTE — Telephone Encounter (Signed)
Agree with recommendation to reduce Farxiga to 5mg  QD. Will reassess symptoms at follow up.   , NP

## 2020-08-31 NOTE — Telephone Encounter (Signed)
-----   Message from Alver Sorrow, NP sent at 08/28/2020  4:21 PM EDT ----- Normal kidney function, liver function, and electrolytes. Good result! Cholesterol panel improved though not yet at goal. Recommend addition of Zetia 10mg  daily. Continue Atorvastatin 80mg  daily.

## 2020-08-31 NOTE — Telephone Encounter (Signed)
Attempted to call pt back to discuss medications below.

## 2020-08-31 NOTE — Telephone Encounter (Signed)
Spoke to pt. She clarified that Entresto, Spironlolactone and Lasix were not at [facility] ready for pick up on Friday.  I tried to call pharmacy this morning but they were closed at time of call.  Pt is going to call today to ensure they are available for pick up. Did confirm that they were sent on 4/22.   Pt c/o incr weakness, "feeling tired", and DOE that began 3 days ago "which is when I incr Farxiga from 5mg  to 10mg ".  Pt did not have these symptoms with lower dose. She has not started any new medication as she has not picked up Spironolactone as of yet.  Pt also does not have BP or HR readings when asked, but states "they have been normal."   Advised pt she may cut tablet in half to return to 5mg  of Farxiga to see if her s/s resolve. Will forward to provider to make aware and for further recc.

## 2020-08-31 NOTE — Telephone Encounter (Signed)
Attempted to call pt. No answer. Lmtcb.  

## 2020-09-01 NOTE — Telephone Encounter (Signed)
Attempted to call pt x 2. No answer. Lmtcb.  

## 2020-09-14 ENCOUNTER — Encounter: Payer: Self-pay | Admitting: *Deleted

## 2020-09-14 MED ORDER — EZETIMIBE 10 MG PO TABS: 10.0000 mg | ORAL_TABLET | Freq: Every day | ORAL | 3 refills | Status: DC

## 2020-09-14 NOTE — Progress Notes (Signed)
Letter with results and recommendations sent to patient.

## 2020-09-15 ENCOUNTER — Telehealth: Payer: Self-pay

## 2020-09-15 ENCOUNTER — Ambulatory Visit: Payer: Medicaid Other | Admitting: Gerontology

## 2020-09-15 NOTE — Telephone Encounter (Signed)
LVM about appt on 5/10 being at [facility]

## 2020-09-16 ENCOUNTER — Other Ambulatory Visit: Payer: Self-pay

## 2020-09-16 ENCOUNTER — Ambulatory Visit: Payer: Medicaid Other | Admitting: Pharmacy Technician

## 2020-09-16 DIAGNOSIS — Z79899 Other long term (current) drug therapy: Secondary | ICD-10-CM

## 2020-09-16 NOTE — Progress Notes (Signed)
Met with patient completed financial assistance application for [facility] due to recent hospital visit.  Patient agreed to be responsible for gathering financial information and forwarding to appropriate department in [facility].    Completed [facility] application and contract.  Patient agreed to all terms of the [facility] contract.    Patient approved to receive medication assistance at [facility] until time for re-certification in 5927.    Provided patient with community resource material based on her particular needs.    [facility]

## 2020-09-17 ENCOUNTER — Telehealth: Payer: Self-pay | Admitting: Pharmacist

## 2020-09-17 NOTE — Telephone Encounter (Signed)
09/17/2020 12:50:17 PM - Elizabeth Herring & Entresto to pat. & provider  -- Rhetta Mura - Thursday, Sep 17, 2020 12:47 PM -- Received pharmacy printouts for: Elizabeth Herring 10mg  Take 1 tablet by mouth daily before breakfast  &  Entresto 24/26mg  Take 1 tablet by mouth two times daily  Printed [employer]-Entresto &  [employer]-Farxiga applications- mailing patient her portion to sign & return & sending provider portion to [address] for provider to sign & return.

## 2020-09-24 ENCOUNTER — Other Ambulatory Visit: Payer: Self-pay

## 2020-10-07 ENCOUNTER — Ambulatory Visit (INDEPENDENT_AMBULATORY_CARE_PROVIDER_SITE_OTHER): Payer: Self-pay

## 2020-10-07 ENCOUNTER — Other Ambulatory Visit: Payer: Self-pay

## 2020-10-07 DIAGNOSIS — I5020 Unspecified systolic (congestive) heart failure: Secondary | ICD-10-CM

## 2020-10-07 LAB — ECHOCARDIOGRAM COMPLETE
AR max vel: 1.66 cm2
AV Area VTI: 1.28 cm2
AV Area mean vel: 1.55 cm2
AV Mean grad: 2 mmHg
AV Peak grad: 3.5 mmHg
Ao pk vel: 0.93 m/s
Calc EF: 14.9 %
MV VTI: 0.42 cm2
S' Lateral: 6.8 cm
Single Plane A2C EF: 14.9 %
Single Plane A4C EF: 5 %

## 2020-10-07 MED ORDER — PERFLUTREN LIPID MICROSPHERE
1.0000 mL | INTRAVENOUS | Status: AC | PRN
  Administered 2020-10-07: 2 mL via INTRAVENOUS

## 2020-10-08 ENCOUNTER — Telehealth: Payer: Self-pay | Admitting: *Deleted

## 2020-10-08 NOTE — Progress Notes (Deleted)
Cardiology Office Note:    Date:  10/09/2020   ID:  Elizabeth Herring, DOB 04/30/1956, MRN [id number]  PCP:  Pcp, No  [facility] Cardiologist:  Julien Nordmann, MD  [facility] Electrophysiologist:  None   Referring MD: Alver Sorrow, NP   Chief Complaint: 1 month f/u  History of Present Illness:    Elizabeth Herring is a 65 y.o. female with a hx of CAD status post STEMI 2005 with PCI to left circumflex and STEMI 07/15/2020 without intervention, hypertension, hyperlipidemia, tobacco use, orthostatic hypotension, obesity, HFrEF who presents for 1 month follow-up.  Coronary artery disease dates back to 2005 with MI and PCI to left circumflex.  Catheterization at that time showed 100% left circumflex, 70% LAD, 70% RCA, 0% left main disease.  She was hospitalized at [facility] with echo showing EF 20 to 25%, severe global hypokinesis of LV, mild LAD, moderate to severe MR, moderate TR, mild PAH, mild PR.  She was subsequently lost to follow-up and discontinued her cardiac medications.  She presented to [facility] ED 07/14/2020 for weakness and shortness of breath.  She had runs of VT on telemetry and troponin was greater than 27,000.  She underwent left heart cath with Dr. Juliann Pares showing moderately to severely depressed LVEF of 25 to 30%, RCA with CTO, LM 25%, dLAD 95%, pLAD-dLAD 75%, dLM-pLAD 50%, p-mLCx 100% with in-stent thrombosis.  Moderate collaterals noted from left to right and left to left.  Intervention with PCI was attempted but unsuccessful.  Recommendation for medical therapy.  She was discharged with a LifeVest.  She was seen in follow-up 07/27/2020.  She was weaning cigarettes and wearing ZOLL LifeVest.  She reported compliance with her medications.  She was seen for 20-22 and had run out of her medications days prior to her appointment.  He was euvolemic.  Marcelline Deist was increased to 10 mg daily.  Spyro 12.5 mg was added.  Lasix was reduced to 20 mg daily.  Echo was  ordered.  Echo showed reduced EF less than 20%.  Today,     No past medical history on file.  Past Surgical History:  Procedure Laterality Date  . CORONARY/GRAFT ACUTE MI REVASCULARIZATION N/A 07/15/2020   Procedure: Coronary/Graft Acute MI Revascularization;  Surgeon: Alwyn Pea, MD;  Location: [facility] INVASIVE CV LAB;  Service: Cardiovascular;  Laterality: N/A;  . LEFT HEART CATH AND CORONARY ANGIOGRAPHY N/A 07/15/2020   Procedure: LEFT HEART CATH AND CORONARY ANGIOGRAPHY;  Surgeon: Alwyn Pea, MD;  Location: [facility] INVASIVE CV LAB;  Service: Cardiovascular;  Laterality: N/A;    Current Medications: No outpatient medications have been marked as taking for the 10/09/20 encounter (Appointment) with Fransico Michael, Birtie Fellman H, PA-C.     Allergies:   Chlorhexidine   Social History   Socioeconomic History  . Marital status: Single    Spouse name: Not on file  . Number of children: Not on file  . Years of education: Not on file  . Highest education level: Not on file  Occupational History  . Not on file  Tobacco Use  . Smoking status: Current Every Day Smoker  . Smokeless tobacco: Never Used  . Tobacco comment: 5 cig. per day--07/27/20 TW  Vaping Use  . Vaping Use: Never used  Substance and Sexual Activity  . Alcohol use: Yes  . Drug use: Never  . Sexual activity: Not on file  Other Topics Concern  . Not on file  Social History Narrative  . Not on file  Social Determinants of Health   Financial Resource Strain: Not on file  Food Insecurity: Not on file  Transportation Needs: Not on file  Physical Activity: Not on file  Stress: Not on file  Social Connections: Not on file     Family History: The patient's ***family history is not on file.  ROS:   Please see the history of present illness.    *** All other systems reviewed and are negative.  EKGs/Labs/Other Studies Reviewed:    The following studies were reviewed today:  Echo 10/07/20 1. Left ventricular  ejection fraction, by estimation, is <20%. The left  ventricle has severely decreased function. The left ventricle demonstrates  global hypokinesis. The left ventricular internal cavity size was severely  dilated. Left ventricular  diastolic parameters are indeterminate.  2. Right ventricular systolic function is mildly reduced. The right  ventricular size is mildly enlarged. There is moderately elevated  pulmonary artery systolic pressure. The estimated right ventricular  systolic pressure is 53.8 mmHg.  3. The mitral valve is normal in structure. Mild mitral valve  regurgitation.  4. Tricuspid valve regurgitation is moderate.   Echo 07/16/20  1. Left ventricular ejection fraction, by estimation, is <20%. Left  ventricular ejection fraction by PLAX is 6 %. The left ventricle has  severely decreased function. The left ventricle demonstrates global  hypokinesis. The left ventricular internal  cavity size was severely dilated. Left ventricular diastolic parameters  are indeterminate.  2. Right ventricular systolic function is normal. The right ventricular  size is normal.  3. Left atrial size was mildly dilated.  4. Mild mitral valve regurgitation.  5. The inferior vena cava is normal in size with <50% respiratory  variability, suggesting right atrial pressure of 8 mmHg.   Cardiac cath 07/15/20   Prox RCA lesion is 100% stenosed. CTO  Mid LM lesion is 25% stenosed.  Dist LAD lesion is 95% stenosed.  Prox LAD to Dist LAD lesion is 75% stenosed.  Dist LM to Prox LAD lesion is 50% stenosed.  Prox Cx to Mid Cx lesion is 100% stenosed. In-stent thrombosis of the stent IRA TIMI 0 flow  Moderate collaterals left to right left to left  Left ventricular function severely depressed left ventricular enlargement EF between 25 and 30% globally  Unsuccessful attempted PCI and stent to ostial old stent to the circumflex with in-stent thrombosis failure to cross with a wire    Conclusion STEMI presentation in house late recognition Troponins were greater than 27,000 EKG had diffuse ST elevation inferior laterally at admission and 24 hours later when STEMI was called Diagnostic cardiac cath showed severely depressed left ventricular function ejection fraction was between 25 and 30% with left ventricular enlargement Coronaries Left main large minor distal disease  LAD was large diffuse 50 to 75% proximal to mid, diffuse 75 to 95% mid to distal Circumflex is large ostial stent occluded thrombotic in-stent thrombosis from an old stent TIMI 0 flow RCA medium to small a completely occluded proximally CTO  Intervention Unsuccessful attempt at PCI and stent of in-stent thrombosis of old proximal stent to circumflex Unable to cross with a wire Patient developed heart failure type symptoms Treated with Lasix Groin was mynx Patient was transferred to ICU for aggressive medical therapy for heart failure and post MI care Coronary Diagrams   Diagnostic Dominance: Right         EKG:  EKG is *** ordered today.  The ekg ordered today demonstrates ***  Recent Labs: 07/14/2020: TSH 0.799  07/16/2020: B Natriuretic Peptide 767.9 07/20/2020: Hemoglobin 12.1; Magnesium 2.0; Platelets 215 08/28/2020: ALT 13; BUN 12; Creatinine, Ser 0.80; Potassium 4.0; Sodium 137  Recent Lipid Panel    Component Value Date/Time   CHOL 207 (H) 08/28/2020 1549   TRIG 61 08/28/2020 1549   HDL 75 08/28/2020 1549   CHOLHDL 2.8 08/28/2020 1549   VLDL 12 08/28/2020 1549   LDLCALC 120 (H) 08/28/2020 1549   LDLDIRECT 121.3 (H) 08/28/2020 1549     Risk Assessment/Calculations:   {Does this patient have ATRIAL FIBRILLATION?:[phone]}   Physical Exam:    VS:  There were no vitals taken for this visit.    Wt Readings from Last 3 Encounters:  08/28/20 139 lb (63 kg)  07/27/20 136 lb (61.7 kg)  07/20/20 144 lb 11.2 oz (65.6 kg)     GEN: *** Well nourished, well developed in no  acute distress HEENT: Normal NECK: No JVD; No carotid bruits LYMPHATICS: No lymphadenopathy CARDIAC: ***RRR, no murmurs, rubs, gallops RESPIRATORY:  Clear to auscultation without rales, wheezing or rhonchi  ABDOMEN: Soft, non-tender, non-distended MUSCULOSKELETAL:  No edema; No deformity  SKIN: Warm and dry NEUROLOGIC:  Alert and oriented x 3 PSYCHIATRIC:  Normal affect   ASSESSMENT:    1. NSVT (nonsustained ventricular tachycardia) (HCC)   2. Coronary artery disease involving native coronary artery of native heart with angina pectoris (HCC)   3. PVC (premature ventricular contraction)   4. Hyperlipidemia LDL goal <70   5. HFrEF (heart failure with reduced ejection fraction) (HCC)   6. Ischemic cardiomyopathy   7. History of ventricular tachycardia   8. Current tobacco use    PLAN:    In order of problems listed above:  CAD  HFrEF ICM  SVT/NSVT/PVC  HTN  HLD  Tobacco use  Disposition: Follow up {follow up:15908} with ***   Shared Decision Making/Informed Consent   {Are you ordering a CV Procedure (e.g. stress test, cath, DCCV, TEE, etc)?   Press F2        :[id number]}    Signed, Alann Avey Ardelle Lesches  10/09/2020 7:56 AM    [facility]

## 2020-10-08 NOTE — Telephone Encounter (Signed)
-----   Message from Caitlin S Walker, NP sent at 10/07/2020  4:52 PM EDT ----- Heart pumping function remains reduced. Please continue wearing Life Vest. Will need referral to EP, Dr. Lambert, for discussion regarding ICD.  

## 2020-10-08 NOTE — Telephone Encounter (Signed)
Attempted to call pt to notify of echo result and provider recc. No answer. Lmtcb.   Pt has follow up scheduled tomorrow 6/3.  Will also forward to provider to make aware.

## 2020-10-09 ENCOUNTER — Ambulatory Visit: Payer: Self-pay | Admitting: Medical

## 2020-10-09 NOTE — Telephone Encounter (Signed)
Pt scheduled for f/u 10/20/20.

## 2020-10-12 ENCOUNTER — Telehealth: Payer: Self-pay | Admitting: *Deleted

## 2020-10-12 NOTE — Telephone Encounter (Signed)
-----   Message from Alver Sorrow, NP sent at 10/07/2020  4:52 PM EDT ----- Heart pumping function remains reduced. Please continue wearing Life Vest. Will need referral to EP, Dr. Lalla Brothers, for discussion regarding ICD.

## 2020-10-12 NOTE — Telephone Encounter (Signed)
Attempted to call pt. No answer. Lmtcb.  Pt has f/u scheduled 10/20/20.

## 2020-10-13 NOTE — Telephone Encounter (Signed)
Attempted to call pt, third attempt. No answer. Lmtcb.  Due to unsuccessful multiple attempts to reach pt, letter mailed asking pt to call office to discuss results.

## 2020-10-20 ENCOUNTER — Encounter: Payer: Self-pay | Admitting: Physician Assistant

## 2020-10-20 ENCOUNTER — Other Ambulatory Visit: Payer: Self-pay

## 2020-10-20 ENCOUNTER — Ambulatory Visit (INDEPENDENT_AMBULATORY_CARE_PROVIDER_SITE_OTHER): Payer: Self-pay | Admitting: Physician Assistant

## 2020-10-20 VITALS — BP 130/90 | HR 108 | Ht 60.0 in | Wt 140.0 lb

## 2020-10-20 DIAGNOSIS — I5020 Unspecified systolic (congestive) heart failure: Secondary | ICD-10-CM

## 2020-10-20 DIAGNOSIS — E785 Hyperlipidemia, unspecified: Secondary | ICD-10-CM

## 2020-10-20 DIAGNOSIS — I472 Ventricular tachycardia: Secondary | ICD-10-CM

## 2020-10-20 DIAGNOSIS — I4729 Other ventricular tachycardia: Secondary | ICD-10-CM

## 2020-10-20 DIAGNOSIS — I493 Ventricular premature depolarization: Secondary | ICD-10-CM

## 2020-10-20 DIAGNOSIS — Z720 Tobacco use: Secondary | ICD-10-CM

## 2020-10-20 DIAGNOSIS — I10 Essential (primary) hypertension: Secondary | ICD-10-CM

## 2020-10-20 DIAGNOSIS — I25119 Atherosclerotic heart disease of native coronary artery with unspecified angina pectoris: Secondary | ICD-10-CM

## 2020-10-20 DIAGNOSIS — I951 Orthostatic hypotension: Secondary | ICD-10-CM

## 2020-10-20 MED ORDER — ENTRESTO 24-26 MG PO TABS: 1.0000 | ORAL_TABLET | Freq: Two times a day (BID) | ORAL | Status: DC

## 2020-10-20 NOTE — Patient Instructions (Signed)
Medication Instructions:   TAKE Lasix (Furosemide) 20mg  TWICE daily x 4 days  THEN resume Lasix 20mg  DAILY thereafter  *If you need a refill on your cardiac medications before your next appointment, please call your pharmacy*   Lab Work:  BMET Today  If you have labs (blood work) drawn today and your tests are completely normal, you will receive your results only by: MyChart Message (if you have MyChart) OR A paper copy in the mail If you have any lab test that is abnormal or we need to change your treatment, we will call you to review the results.   Testing/Procedures:  Your physician has requested that you have an echocardiogram in 3 months. Echocardiography is a painless test that uses sound waves to create images of your heart. It provides your doctor with information about the size and shape of your heart and how well your heart's chambers and valves are working. This procedure takes approximately one hour. There are no restrictions for this procedure.   Follow-Up: At [facility], you and your health needs are our priority.  As part of our continuing mission to provide you with exceptional heart care, we have created designated Provider Care Teams.  These Care Teams include your primary Cardiologist (physician) and Advanced Practice Providers (APPs -  Physician Assistants and Nurse Practitioners) who all work together to provide you with the care you need, when you need it.  We recommend signing up for the patient portal called "MyChart".  Sign up information is provided on this After Visit Summary.  MyChart is used to connect with patients for Virtual Visits (Telemedicine).  Patients are able to view lab/test results, encounter notes, upcoming appointments, etc.  Non-urgent messages can be sent to your provider as well.   To learn more about what you can do with MyChart, go to .    Your next appointment:   3 month(s) after Echo  The format for your  next appointment:   In Person  Provider:   You may see [facility], MD or one of the following Advanced Practice Providers on your designated Care Team:    [email or web address], PA-C    Other Instructions  Continue to wear your life vest until you follow up in 3 months

## 2020-10-20 NOTE — Progress Notes (Signed)
Office Visit    Patient Name: Elizabeth Herring Date of Encounter: 10/20/2020  PCP:  Aviva Kluver   [facility]  Cardiologist:  Julien Nordmann, MD  Advanced Practice Provider:  No care team member to display Electrophysiologist:  None   Chief Complaint    Chief Complaint  Patient presents with   Follow-up    1 month---after echo  C/o dry cough and fatigue     65 yo female with complex history of CAD, HFrEF (EF <20%), PHTN, MR, TR, h/o STEMI 2005 with PCI to LCx, ST/NSVT/PVCs, HTN, hyperlipidemia, obesity, current tobacco use, orthostatic hypotension, and who is being seen today for follow-up after STEMI s/p catheterization 07/15/2020 without intervention.  Past Medical History    No past medical history on file. Past Surgical History:  Procedure Laterality Date   CORONARY/GRAFT ACUTE MI REVASCULARIZATION N/A 07/15/2020   Procedure: Coronary/Graft Acute MI Revascularization;  Surgeon: Alwyn Pea, MD;  Location: [facility] INVASIVE CV LAB;  Service: Cardiovascular;  Laterality: N/A;   LEFT HEART CATH AND CORONARY ANGIOGRAPHY N/A 07/15/2020   Procedure: LEFT HEART CATH AND CORONARY ANGIOGRAPHY;  Surgeon: Alwyn Pea, MD;  Location: [facility] INVASIVE CV LAB;  Service: Cardiovascular;  Laterality: N/A;    Allergies  Allergies  Allergen Reactions   Chlorhexidine     History of Present Illness    Elizabeth Herring is a 65 y.o. female with PMH as above.   She is a current smoker at 1/2 pack/day (previously smoked 6 cigarettes/day in 2014). She is not currently on any cardiac medications.  She has not followed up with any other cardiologist since 2014 at [facility] as below.  She is retired and formerly worked at a [employer].  She lives with her sister in [place].     She has a previous history of MI with PCI to her left circumflex in 2005. 2005 LHC showed 100% LCx, 70% LAD, 70% RCA, 0% left main disease.     2014 echo performed at  [facility] with 2014 EF 20 to 25%, severe global hypokinesis of the LV, mild LAE, moderate to severe MR, moderate TR, mild pulmonary hypertension, mild pulmonic regurgitation.   She was lost to follow-up after initial cardiology consult 11/2012 [facility] for orthostatic sx.  At that time, she was smoking 6 cigarettes/day with rare alcohol use.  She reported feeling dizzy if standing too quickly.  Her echo was reviewed with recommendation for aggressive risk factor modification as outlined in her care everywhere note.  She was then lost to follow-up and has not continued her cardiac medications.   She presented to [facility] emergency department 07/14/2020 after feeling weak since Sunday 3/6, followed by progressive DOE/SOB starting Monday 07/13/20.  She presented to urgent care 3/8 and was then sent to the [facility] ED.   She reported progressive shortness of breath, cough, and clear phlegm. Vitals significant for HTN, tachypnea, hypoxia, and sinus tachycardia. IVF discontinued 3/9 due to pulmonary edema.  She then had a 26 beat run of V. tach with K 2.9.  HS Tn > 27,000.   EKG sinus tachycardia with diffuse ST elevation anterior laterally and changes also noted on previous EKGs. Code STEMI activated. Subsequent LHC by Dr. Juliann Pares showed moderate to severely depressed LVSF (estimated 25-30%), RCA with CTO, mLM with 25%s, dLAD 95%s, pLAD-dLAD 75%s, dLM-pLAD 50%s, p-mLCx 100%s with in-stent thrombosis of the stent IRA TIMI 0 flow.  Moderate collaterals were noted from left to right and left  to left.  Intervention with PCI was attempted but unsuccessful.  Case aborted with recommendation for medical therapy. After cath, she noted symptoms as significantly improved.  She was discharged with Zoll Life vest and medial therapy.  Seen in follow-up 07/27/2020 and wearing her ZOLL LifeVest.  She was weaning off of cigarettes and at 5 total since discharge.  She reported compliance with her medications and assistance for the medication  management program.  Seen again 08/28/2020 and with report that her medications had run out earlier that week and approximately 2 days prior.  Otherwise, she reported medication compliance and that she was obtaining her medications per medication management.  No chest pain.  She was walking 20 minutes for exercise each day.  Dyspnea was improving.  Her sister was helping to prepare meals.  She was not adding or cooking with salt.  She is drinking 1 cup of coffee in the morning and water throughout the day.  Coffee with water.  He home SBP 1 25-1 28 DBP 90s.    She was increased to [place] 10 mg daily, later decreased back down to [place] 5mg  daily again. Spironolactone 12.5mg  qd added. Lasix reduced to 20 mg daily.  Repeat echo was planned for 6/25-or 3 months with GDMT.  It was noted that if EF below 35% at that time, he would be referred to EP for consideration of ICD.  She was to continue to wear her LifeVest until that time.  She was restarted on Toprol 25 mg daily and Entresto ( of these medications).  Lipids remained above 70 at recheck, and she was started on Zetia in addition to her statin / atorvastatin 80mg  daily  She is encouraged to quit smoking.   10/07/20 Repeat echo performed with EF still reduced and referral to EP recommended. She was continued on her LifeVest.  Today, 10/20/2020, she returns to clinic and notes a dry cough.  She does have seasonal allergies, which she suspects is contributing to her symptoms.  She is also been out of her Zyrtec and reports that her allergies have been bothering her. In addition to her allergies, and as discussed, she notes several sx consistent with volume overload. She notes that her cough is worse when laying down flat.  She does report using 3 pillows at night, though she feels this is 2/2 comfort. Occasionally, her cough will produce clear phlegm.  She reports bilateral lower extremity edema with significant pedal edema noted on exam and left greater than  that of right.  Lower extremity edema improves with leg elevation.  No PND or early satiety. No abdominal distention. She does not have a regular workout routine.  She dies report at least 15 minutes of daily activity walking around the house without CP or SOB. No CP or SOB at rest.   She is drinking 16 ounces of water per day.  She denies alcohol use. She has noticed that she has been drinking more fluids lately, however. She denies any tachypalpitations. No presyncope or syncope. No s/sx of bleeding. She has not been taking her Zetia. She is tolerating the reduced dose 12/07/20. Unfortunately, she ran out of her Entresto samples and has thus not been taking Entresto 24-26 twice daily.  Rather, she is gone back to taking her losartan.  Medication management form has been filled out/completed and its at home.  We will work towards getting her back on Entresto 24-26 twice daily, given her reduced EF on repeat echo from 10-07-2020.  We  reviewed her echo today with recommendation that she be referred to EP.  Given she has not been on ideal guideline directed medical management, we will defer EP referral at this time and instead transition her to [facility] and repeat an echo in 3 months once she has consistently been on Entresto.  She knows to discontinue her ARB when she starts Entresto. We discussed monitoring her BP. She is agreeable to revisit EP referral if echo shows reduced EF after 3 months of Entresto. She does not wish to be referred to EP at this time. She does not wish to have a device at this time and hopeful Sherryll Burger will help her EF. She is still wearing her LifeVest and agreeable to continue to wear her LifeVest until we are able to confirm EF greater than 35% or get her a cardiac device for ongoing low EF.  She is working on cutting out smoking and does state that she is not smoking as much as she has been in the past.  She does not feel that she has sleep apnea at this time but is uncertain if she snores  or has other symptoms of apnea as discussed today. She does report fatigue. As below, in the future, agreeable to [facility] though will defer for now and per pt preference. Further recommendations as below.   Home Medications    Current Outpatient Medications on File Prior to Visit  Medication Sig Dispense Refill   aspirin 81 MG EC tablet TAKE ONE TABLET BY MOUTH EVERY DAY 90 tablet 1   atorvastatin (LIPITOR) 80 MG tablet TAKE ONE TABLET BY MOUTH EVERY DAY 90 tablet 1   cetirizine (ZYRTEC) 10 MG tablet Take 10 mg by mouth daily.     clopidogrel (PLAVIX) 75 MG tablet TAKE ONE TABLET BY MOUTH EVERY DAY 90 tablet 1   dapagliflozin propanediol (FARXIGA) 5 MG TABS tablet Take 5 mg by mouth daily.     ezetimibe (ZETIA) 10 MG tablet Take 1 tablet (10 mg total) by mouth daily. 90 tablet 3   furosemide (LASIX) 20 MG tablet Take 1 tablet (20 mg total) by mouth daily. 90 tablet 3   metoprolol succinate (TOPROL-XL) 25 MG 24 hr tablet TAKE ONE TABLET BY MOUTH EVERY DAY 90 tablet 1   Multiple Vitamin (MULTI-VITAMIN) tablet Take 1 tablet by mouth daily.     spironolactone (ALDACTONE) 25 MG tablet Take 0.5 tablets (12.5 mg total) by mouth daily. 45 tablet 3   No current facility-administered medications on file prior to visit.    Review of Systems    She reports fatigue and a cough with occasional clear phlegm and states her cough worsens when laying flat.  She is using 3 pillows, though she states this is more due to comfort than shortness of breath, though as above she does have worsening cough with laying flat.  She reports lower extremity edema that improves with leg elevation.  She does have allergies and wonders if her allergies are contributing to her symptoms.  She denies chest pain, palpitations, dyspnea, pnd, n, v, dizziness, syncope, weight gain, or early satiety.   All other systems reviewed and are otherwise negative except as noted above.  Physical Exam    VS:  BP 130/90   Pulse (!) 108   Ht  5' (1.524 m)   Wt 140 lb (63.5 kg)   BMI 27.34 kg/m  , BMI Body mass index is 27.34 kg/m. GEN: Well nourished, well developed, in no acute distress.  Wearing her LifeVest. HEENT: normal. Neck: Supple, JVP elevated at at least 10 cm - 11cm, no carotid bruits or masses. Cardiac: Tachycardic but regular, 1/6 systolic murmur.  No rubs, or gallops. No clubbing, cyanosis.  Bilateral moderate to 1+ edema worse at distal lower extremities/pedal edema worse with left pedal edema greater than that of right.  Radials/DP/PT 2+ and equal bilaterally.  Wearing her LifeVest. Respiratory: Accessory muscle use noted during breathing, left-sided crackles, right-sided reduced basilar breath sounds. GI: Soft, nontender, nondistended, BS + x 4. MS: no deformity or atrophy. Skin: warm and dry, no rash. Neuro:  Strength and sensation are intact. Psych: Normal affect.  Accessory Clinical Findings    ECG personally reviewed by me today sinus tachycardia, 108 bpm, possible LAE, IVCD with QRS 112 ms, LVH with repolarization abnormality, poor R wave progression in leads III, aVF with Q in 3, aVF, prolonged QTC at 490 ms- no acute changes.  VITALS Reviewed today   Temp Readings from Last 3 Encounters:  07/20/20 98.7 F (37.1 C) (Oral)  07/14/20 98.6 F (37 C) (Oral)   BP Readings from Last 3 Encounters:  10/20/20 130/90  08/28/20 (!) 146/90  07/27/20 108/66   Pulse Readings from Last 3 Encounters:  10/20/20 (!) 108  08/28/20 (!) 111  07/27/20 93    Wt Readings from Last 3 Encounters:  10/20/20 140 lb (63.5 kg)  08/28/20 139 lb (63 kg)  07/27/20 136 lb (61.7 kg)     LABS  reviewed today   Lab Results  Component Value Date   WBC 5.9 07/20/2020   HGB 12.1 07/20/2020   HCT 36.0 07/20/2020   MCV 104.7 (H) 07/20/2020   PLT 215 07/20/2020   Lab Results  Component Value Date   CREATININE 0.80 08/28/2020   BUN 12 08/28/2020   NA 137 08/28/2020   K 4.0 08/28/2020   CL 105 08/28/2020   CO2 22  08/28/2020   Lab Results  Component Value Date   ALT 13 08/28/2020   AST 23 08/28/2020   ALKPHOS 67 08/28/2020   BILITOT 1.1 08/28/2020   Lab Results  Component Value Date   CHOL 207 (H) 08/28/2020   HDL 75 08/28/2020   LDLCALC 120 (H) 08/28/2020   LDLDIRECT 121.3 (H) 08/28/2020   TRIG 61 08/28/2020   CHOLHDL 2.8 08/28/2020    Lab Results  Component Value Date   HGBA1C 5.6 07/16/2020   Lab Results  Component Value Date   TSH 0.799 07/14/2020     STUDIES/PROCEDURES reviewed today   Echo  10/2020  1. Left ventricular ejection fraction, by estimation, is <20%. The left  ventricle has severely decreased function. The left ventricle demonstrates  global hypokinesis. The left ventricular internal cavity size was severely  dilated. Left ventricular  diastolic parameters are indeterminate.   2. Right ventricular systolic function is mildly reduced. The right  ventricular size is mildly enlarged. There is moderately elevated  pulmonary artery systolic pressure. The estimated right ventricular  systolic pressure is 53.8 mmHg.   3. The mitral valve is normal in structure. Mild mitral valve  regurgitation.   4. Tricuspid valve regurgitation is moderate.   Echo 07/16/20  1. Left ventricular ejection fraction, by estimation, is <20%. Left  ventricular ejection fraction by PLAX is 6 %. The left ventricle has  severely decreased function. The left ventricle demonstrates global  hypokinesis. The left ventricular internal  cavity size was severely dilated. Left ventricular diastolic parameters  are indeterminate.   2.  Right ventricular systolic function is normal. The right ventricular  size is normal.   3. Left atrial size was mildly dilated.   4. Mild mitral valve regurgitation.   5. The inferior vena cava is normal in size with <50% respiratory  variability, suggesting right atrial pressure of 8 mmHg.   LHC 07/15/20 Prox RCA lesion is 100% stenosed. CTO Mid LM lesion is 25%  stenosed. Dist LAD lesion is 95% stenosed. Prox LAD to Dist LAD lesion is 75% stenosed. Dist LM to Prox LAD lesion is 50% stenosed. Prox Cx to Mid Cx lesion is 100% stenosed. In-stent thrombosis of the stent IRA TIMI 0 flow Moderate collaterals left to right left to left Left ventricular function severely depressed left ventricular enlargement EF between 25 and 30% globally Unsuccessful attempted PCI and stent to ostial old stent to the circumflex with in-stent thrombosis failure to cross with a wire Conclusion STEMI presentation in house late recognition Troponins were greater than 27,000 EKG had diffuse ST elevation inferior laterally at admission and 24 hours later when STEMI was called Diagnostic cardiac cath showed severely depressed left ventricular function ejection fraction was between 25 and 30% with left ventricular enlargement Coronaries Left main large minor distal disease  LAD was large diffuse 50 to 75% proximal to mid, diffuse 75 to 95% mid to distal Circumflex is large ostial stent occluded thrombotic in-stent thrombosis from an old stent TIMI 0 flow RCA medium to small a completely occluded proximally CTO Intervention Unsuccessful attempt at PCI and stent of in-stent thrombosis of old proximal stent to circumflex Unable to cross with a wire Patient developed heart failure type symptoms Treated with Lasix Groin was mynx Patient was transferred to ICU for aggressive medical therapy for heart failure and post MI care Coronary Diagrams     Diagnostic Dominance: Right    Previous echo 2014   PROCEDURE Study Quality: Technically adequate. - SUMMARY The left ventricle is severely dilated. There is normal left ventricular wall thickness.  Left ventricular systolic function is severely reduced. LV ejection fraction = 20-25%. There is severe global hypokinesis of the left ventricle. The right ventricle is normal in size and function. The left atrium is mildly  dilated. There is moderate to severe mitral regurgitation. There is moderate tricuspid regurgitation. Mild pulmonary hypertension. Mild pulmonic valvular regurgitation. There is no pericardial effusion. Compared to prior study, significant changes have occured. - FINDINGS:  LEFT VENTRICLE There is normal left ventricular wall thickness. The left ventricle is  severely dilated. Left ventricular systolic function is severely reduced. LV  ejection fraction = 20-25%. Left ventricular filling pattern is pseudonormal.  Lateral E/E' suggestive of increased filling pressures. There is severe  global hypokinesis of the left ventricle. -  RIGHT VENTRICLE The right ventricle is normal in size and function.  LEFT ATRIUM The left atrium is mildly dilated.  RIGHT ATRIUM  Right atrial size is normal. - AORTIC VALVE The aortic valve is normal in structure and function. The aortic valve is  trileaflet. Trace (trivial) aortic regurgitation. - MITRAL VALVE The mitral valve leaflets appear normal. There is no evidence of stenosis,  fluttering, or prolapse. There is moderate to severe mitral regurgitation. MR  likely function secondary to dilated cardiomyopathy. - TRICUSPID VALVE Structurally normal tricuspid valve. There is moderate tricuspid  regurgitation. Estimated right atrial pressure is 10 mmHg.. Right ventricular  systolic pressure is elevated at 41-3750mmHg. Mild pulmonary hypertension. - PULMONIC VALVE The pulmonic valve is not well visualized. Mild pulmonic valvular  regurgitation. - - VENOUS Pulmonary venous flow pattern not well visualized. IVC size was mildly  dilated. - EFFUSION There is no pericardial effusion. - - MMode/2D Measurements & Calculations IVSd: 0.90 cm LVIDd: 6.4 cm LVPWd: 1.0 cm LVIDs: 5.9 cm LA dim: 4.2 cm Ao root: 2.9 cm EDV(MOD-sp4): 143.0 ml ESV(MOD-sp4): 118.0 ml EDV(MOD-sp2): 145.0 ml ESV(MOD-sp2): 119.0 ml LVOT diam: 1.8 cm LA A2 area:  26.1 cm2 LA length (vol): 5.4 cm LA vol: 98.6 ml LA vol index: 57.2 ml/m2 LAA (a4): 23.9 cm2 RAA (a4): 16.6 cm2 Doppler Measurements & Calculations MV E max vel: 127.7 cm/sec MV A max vel: 116.6 cm/sec MV E/A: 1.1 Lat Peak E' Vel: 5.2 cm/sec E/Lat E`: 24.5 MV dec time: 0.16 sec SV(LVOT): 31.1 ml Ao max PG: 5.5 mmHg Ao mean PG: 2.3 mmHg Ao V2 VTI: 12.5 cm AVA (VTI): 2.5 cm2 LV V1 VTI: 12.2 cm TR max vel: 294.1 cm/sec TR max PG: 34.6 mmHg RVSP(TR): 44.6 mmHg RAP systole: 10.0 mmHg    Assessment & Plan    History of delayed STEMI  Coronary artery disease with previous history of STEMI/PCI (2005) --Denies any chest pain at rest or with exertion.  She is s/p delayed STEMI with cath 07/2020 and unsuccessful PCI.  Repeat echo with ongoing reduced EF less than 20% and recommendation for referral to EP.  On further discussion today, she has not been taking full GDMT and prefers to defer EP referral and discussion regarding a cardiac device until on GDMT for at least 3 months as recommended.  She has been taking losartan and lieu of Entresto after running out of samples. Continue GDMT: ASA 81 mg, Plavix 75mg  daily, Toprol, Lipitor, Zetia.  Restarting Entresto, Zetia. Given low output and volume up HF, no plan for increased dose BB at this time and given plan for increased diuresis and restart of Entresto as below. Continue reduced dose Farxiga 5 mg daily.  She did not tolerate Farxiga 10 mg daily.  Restart/refill Zetia 10 mg daily (Ran out of Zetia and has not been taking). Restart Entresto 24-26 BID (Ran out of her Entresto samples - restarted her losartan at that time.) Discontinue Losartan with restart of Entresto. Medication assistance form for has been completed at home. If unable to fill Entresto due to cost, we will need to obtain samples for pt to bridge the gap of coverage --> needs to be on GDMT with Entresto for at least 3 months before repeating echo as EP is less likely  to recommend ICD for EF below 35% when if not on full GDMT x3 months.    Repeat BMET today.  Diet and activity / lifestyle changes discussed. Smoking cessation recommended with patient working to cut back on smoking. Cardiac rehab has not been completed due to lack of coverage. Ideally, she would complete cardiac rehab if able. Regular activity encouraged.   Chronic HFrEF Pulmonary hypertension --Reports cough that is worse when laying down and bilateral lower extremity edema.  Volume up on exam.  At a previous clinic visit, Lasix 40 mg daily was reduced to Lasix 20 mg daily, which could be contributing. She has ongoing reduced EF less than 35% on recent echo, though with consideration that she has not been on full GDMT. Will defer EP referral, as ICD less likely to be recommended when pt has not been on full GDMT for 3 months.   Lifevest in place. Continue with plan for repeat echo 01/2021. Continue LifeVest, given EF <35% on  10/2020 echo. Repeat echo 01/2021, given 10/2020 echo was not with 3 months full GDMT.  She does not wish to be referred to EP or discuss ICD at this time. Reasonable to defer EP given not on full GDMT x 3 months yet or at time of echo. She understands EP referral will be recommended for consideration of ICD if EF still reduced after 3 months on Entresto (~01/2021). Increase to Lasix  x2 (40 mg total daily) x 4 days then drop down to lasix  daily thereafter. BMET today to confirm Cr stable and K at goal. If still volume up at RTC, consider escalation of spironolactone +/-  Lasix dose going forward and pending pressures / BMET today and at follow-up. Continue remainder of medications as detailed above and including restart of Entresto (discontinue ARB). We will need to ensure she is able to afford her medications going forward. Escalation of GDMT at RTC if room in BP.  Encouraged to call if difficulty obtaining Entresto or needs refills between visits.  Reviewed  recommendation for daily weights and salt under 2 g/day and fluids under 2 L/day.  Fatigue --Consider WatchPat at RTC, given suspicion for sleep apnea. Suspect also that low output HF and volume overload is contributing to energy, as well as her deconditioning. Ideally, cardiac rehab would have been completed. Increase activity as tolerated.  Mild MR, Moderate TR --Discussed murmurs on exam. Discussed associated sx to monitor for going forward. Periodic echo to monitor. Mild MR and moderate TR by 10/2020 echo.   History of sinus tachycardia, NSVT, PVCs --Denies tachypalpitations, presyncope, or syncope. EKG shows ST, as noted at previous visit. Continue current dose Toprol, given this pt is a low output heart failure pt and also volume up with plan for increased diuresis. Caution recommended with BB in decompensated HF and low output HF pts. In addition, would not recommend increased dose BB at the same time as increasing her diuresis and restarting her Entresto. Continue LifeVest. Recheck BMET to ensure electrolytes at goal.   HTN, goal BP <130/80 History of orthostatic hypotension --Restart Entresto 24-26 twice daily. Discontinue losartan with restart of Entresto. --Continue spironolactone 12.5 mg daily.   --4 days of increased diuresis with Lasix 20 mg x2 (  x4 days) as above.   --BMET today and at RTC.  Consider increased dose lasix +/- spironolactone at RTC if volume status still elevated and renal function allows.  --Continue Toprol at current dose. Will defer from increased dose given low output decompensated HF at this time and as outlined above.    HLD --Continue statin and Zetia.  Will restart Zetia, as she ran out of it. LDL goal below 70. Repeat LDL/LFTs once on Zetia for 6-8 weeks straight.   Tobacco use, current --Smoking cessation recommended.    Medication changes:  --Increased dose lasix  x4 days then lasix  daily thereafter. --Restart Entresto 24-26 BID .   --Discontinue Losartan. Labs ordered:  --BMET (restarting Entresto, increased dose diuresis) Studies / Imaging ordered:  --Echo at 3 months of GDMT (was not on Entresto as documented when repeated her echo 10/2020, thus reasonable defer EP referral as requested by pt and repeat once confirmed 3 months of GDMT) Future considerations:  --Increased dose lasix / spironolactone at RTC if needed. --EP referral if EF still reduced after 01/2021 echo. --WatchPat given suspicion for sleep apnea. Disposition:  --RTC after echo, sooner if needed.   Lennon Alstrom, PA-C 10/20/2020

## 2020-10-21 ENCOUNTER — Telehealth: Payer: Self-pay | Admitting: Physician Assistant

## 2020-10-21 ENCOUNTER — Telehealth: Payer: Self-pay | Admitting: Pharmacist

## 2020-10-21 DIAGNOSIS — E876 Hypokalemia: Secondary | ICD-10-CM

## 2020-10-21 LAB — BASIC METABOLIC PANEL
BUN/Creatinine Ratio: 10 — ABNORMAL LOW (ref 12–28)
BUN: 11 mg/dL (ref 8–27)
CO2: 28 mmol/L (ref 20–29)
Calcium: 9.1 mg/dL (ref 8.7–10.3)
Chloride: 97 mmol/L (ref 96–106)
Creatinine, Ser: 1.15 mg/dL — ABNORMAL HIGH (ref 0.57–1.00)
Glucose: 113 mg/dL — ABNORMAL HIGH (ref 65–99)
Potassium: 2.9 mmol/L — ABNORMAL LOW (ref 3.5–5.2)
Sodium: 143 mmol/L (ref 134–144)
eGFR: 53 mL/min/{1.73_m2} — ABNORMAL LOW (ref >59)

## 2020-10-21 NOTE — Telephone Encounter (Signed)
Jefferey Pica, RN  10/21/2020  2:12 PM EDT      Per secure chat message received from Eula Listen, PA: Looks like she was just started on Entresto and is also on spironolactone 12.5. She has been on low dose Lasix 20 mg daily. May sure she is taking the spironolactone daily. If so, please send in KCl 10 mEq and have her take 30 mEq x 1. Jacquelyn should arrange for a follow up BMET this week, I will defer thatto her. I will send her a staff message as well

## 2020-10-21 NOTE — Telephone Encounter (Signed)
Jefferey Pica, RN  10/21/2020  1:42 PM EDT      K+ 2.9. Secure chat sent to Cadence Furth, PA/ Eula Listen, PA to please advise on resultas Harvel Quale is currently out of the office.

## 2020-10-21 NOTE — Telephone Encounter (Signed)
10/21/2020 8:24:15 AM - Marcelline Deist & Entresto faxed for PAP enrollment  -- Rhetta Mura - Wednesday, October 21, 2020 8:22 AM --Patient brought in her signed forms for PAP-faxed the following for enrollment: [employer] for [place] 10mg  Take 1 daily before breakfast # 90 [employer] for 24/26 Take 1 tablet by mouth two (2) times a day # 180

## 2020-10-21 NOTE — Telephone Encounter (Signed)
Attempted to call the patient. No answer- I left a message to please call back as soon as she gets this message.

## 2020-10-23 ENCOUNTER — Other Ambulatory Visit: Payer: Self-pay

## 2020-10-23 ENCOUNTER — Encounter: Payer: Self-pay | Admitting: *Deleted

## 2020-10-23 MED ORDER — POTASSIUM CHLORIDE ER 10 MEQ PO TBCR
EXTENDED_RELEASE_TABLET | ORAL | 0 refills | Status: DC
  Filled 2020-10-23: qty 9, 3d supply, fill #0

## 2020-10-23 NOTE — Telephone Encounter (Signed)
I spoke with the patient regarding her lab results. She is has also been advised of Danella Maiers, PA's recommendations to:  1) START potassium 10 mg- take 3 tablets (30 meq) by mouth once a  day x 3 days  - she does confirm she is taking spironolactone daily  - she confirms she does not yet have entresto in hand to start, but Medication Management is working with her with her application for assistance  I have advised her that I will pull entresto samples for her. She will: 1) start the potassium supplement as directed 2) she will come to the office on Monday 6/20 for a repeat BMP/ to pick up her entresto samples 3) written instructions attached to her samples to make sure stop losartan, if still taking, once she starts the entresto. I do not currently see losartan on her med list from her last visit.    Samples Given: Entresto 24/26 mg Lot: [id number] Exp: 11/2022 # 1 bottle (28 tablets place at the front desk)

## 2020-10-23 NOTE — Telephone Encounter (Signed)
Lennon Alstrom, PA-C  10/21/2020 11:18 PM EDT Back to Top     Agree with recommendations made by Eula Listen, PA-C with the addition of: --Ensure she is taking spironolactone and able to fill Entresto (see paragraphbelow) --KCL tab x3= for 3 days (rather than 1 day). She has a history of hypokalemia and was taking her ARB leading up to this visit, so she willrequire more repletion than initially recommended. --BMET by Friday of this week   Unfortunately, the pt does have difficulty obtaining her medications due to insurance coverage. Would recommend we ensure she is able to have her Entresto and spironolactone filled. If she is unable to fill her Sherryll Burger, I personally received Entresto samples from the rep yesterday that we can provide for her at the front desk until we are able to get her coverage. She is in the process of completing a form for medication management (at the time of our visit, she had the form at home and had just filled it out). She was actually still taking her ARB for that reason up until the visit. Please provide her with samples until she can fill her Entresto. She should stop her ARB when she starts herEntresto.

## 2020-10-26 ENCOUNTER — Other Ambulatory Visit: Payer: Self-pay

## 2020-10-26 ENCOUNTER — Other Ambulatory Visit (INDEPENDENT_AMBULATORY_CARE_PROVIDER_SITE_OTHER): Payer: Self-pay | Admitting: *Deleted

## 2020-10-26 DIAGNOSIS — E876 Hypokalemia: Secondary | ICD-10-CM

## 2020-10-27 LAB — BASIC METABOLIC PANEL
BUN/Creatinine Ratio: 14 (ref 12–28)
BUN: 14 mg/dL (ref 8–27)
CO2: 28 mmol/L (ref 20–29)
Calcium: 9.4 mg/dL (ref 8.7–10.3)
Chloride: 94 mmol/L — ABNORMAL LOW (ref 96–106)
Creatinine, Ser: 1.03 mg/dL — ABNORMAL HIGH (ref 0.57–1.00)
Glucose: 143 mg/dL — ABNORMAL HIGH (ref 65–99)
Potassium: 3.1 mmol/L — ABNORMAL LOW (ref 3.5–5.2)
Sodium: 140 mmol/L (ref 134–144)
eGFR: 61 mL/min/{1.73_m2} (ref >59)

## 2020-10-28 ENCOUNTER — Telehealth: Payer: Self-pay | Admitting: Physician Assistant

## 2020-10-28 DIAGNOSIS — E876 Hypokalemia: Secondary | ICD-10-CM

## 2020-10-28 NOTE — Telephone Encounter (Signed)
Lennon Alstrom, PA-C  10/28/2020  9:08 AM EDT Back to Top     Potassium still low.    For next 3 days, take daily. After then, take Kcl tab daily with lasix.  Recheck BMET in 1 week.

## 2020-10-28 NOTE — Telephone Encounter (Signed)
Called to give the patient lab results and JV recommendation. Lmtcb.

## 2020-10-30 ENCOUNTER — Other Ambulatory Visit: Payer: Self-pay

## 2020-10-30 NOTE — Telephone Encounter (Signed)
Patient returning msg .  If no answer please leave detailed msg with instructions.

## 2020-10-30 NOTE — Telephone Encounter (Signed)
2nd attempt to contact the patient. Lmtcb. 

## 2020-11-02 MED ORDER — POTASSIUM CHLORIDE ER 10 MEQ PO TBCR: EXTENDED_RELEASE_TABLET | ORAL | 0 refills | Status: DC

## 2020-11-02 NOTE — Telephone Encounter (Signed)
3rd attempt to reach the patient by telephone. Patient had requested that a detailed message be let on her voicemail. Lmom with the following instructions.  Potassium still low.   For next 3 days, take 40mEq daily. After then, take Kcl tab 10mEq daily with lasix. Recheck BMET in 1 week.  Lab [facility] placed[facility]. Patient is to contact the office if any questions.   

## 2020-11-02 NOTE — Telephone Encounter (Signed)
3rd attempt to reach the patient by telephone. Patient had requested that a detailed message be let on her voicemail. Lmom with the following instructions.  Potassium still low.   For next 3 days, take daily. After then, take Kcl tab daily with lasix. Recheck BMET in 1 week.  Lab order placed. [facility]. Patient is to contact the office if any questions.

## 2020-11-04 NOTE — Telephone Encounter (Signed)
Patient returning call   States that she needs a refill of potassium chloride sent to medication management and wants to clarify the way she is taking Also wants to know if she can get a refill on Zyrtec - no PCP Please call to discuss

## 2020-11-05 ENCOUNTER — Other Ambulatory Visit: Payer: Self-pay

## 2020-11-05 MED ORDER — POTASSIUM CHLORIDE ER 10 MEQ PO TBCR
EXTENDED_RELEASE_TABLET | ORAL | 0 refills | Status: DC
Start: 2020-11-05 — End: 2021-02-24
  Filled 2020-11-05: qty 30, 30d supply, fill #0
  Filled 2020-12-02: qty 15, 15d supply, fill #1

## 2020-11-05 NOTE — Telephone Encounter (Signed)
Left voicemail message to call back for review of her previous call regarding medication.

## 2020-11-05 NOTE — Addendum Note (Signed)
Addended by: Bryna Colander on: 11/05/2020 03:08 PM   Modules accepted: Orders

## 2020-11-05 NOTE — Telephone Encounter (Signed)
Spoke with patient and reviewed provider recommendations. She states that medication was sent to wrong pharmacy and did not know about these changes. Sent in to right pharmacy and inquired if she could get today. She stated they close at 5 pm but if possible she would go today or tomorrow. Also reviewed that we would like repeat labs in one week from completion of the increased dose of potassium. Instructed her to go over to the [facility] entrance around July 11 th to have those done. She was agreeable with this plan with no further questions at this time.

## 2020-11-16 ENCOUNTER — Other Ambulatory Visit
Admission: RE | Admit: 2020-11-16 | Discharge: 2020-11-16 | Disposition: A | Discharge status: Home / Self Care | Payer: Medicaid Other | Attending: Physician Assistant | Admitting: Physician Assistant

## 2020-11-16 ENCOUNTER — Other Ambulatory Visit: Payer: Self-pay

## 2020-11-16 DIAGNOSIS — E876 Hypokalemia: Secondary | ICD-10-CM | POA: Insufficient documentation

## 2020-11-16 LAB — BASIC METABOLIC PANEL
Anion gap: 10 (ref 5–15)
BUN: 13 mg/dL (ref 8–23)
CO2: 29 mmol/L (ref 22–32)
Calcium: 9.7 mg/dL (ref 8.9–10.3)
Chloride: 99 mmol/L (ref 98–111)
Creatinine, Ser: 1.18 mg/dL — ABNORMAL HIGH (ref 0.44–1.00)
GFR, Estimated: 52 mL/min — ABNORMAL LOW (ref >60)
Glucose, Bld: 143 mg/dL — ABNORMAL HIGH (ref 70–99)
Potassium: 3.2 mmol/L — ABNORMAL LOW (ref 3.5–5.1)
Sodium: 138 mmol/L (ref 135–145)

## 2020-11-17 ENCOUNTER — Telehealth: Payer: Self-pay | Admitting: *Deleted

## 2020-11-17 NOTE — Telephone Encounter (Signed)
Attempted to call pt. No answer. Lmtcb.  

## 2020-11-17 NOTE — Telephone Encounter (Signed)
-----   Message from Lennon Alstrom, PA-C sent at 11/16/2020  6:03 PM EDT ----- Potassium still low. --Take KCL tab x 3 days --Increase to KCL tab whenever taking lasix 20mg  thereafter, given ongoing low K --Repeat BMET 1 week  Could we set her up for a virtual visit to discuss this with Dr. or APP sooner than scheduled?  Suspect this latest round of low potassium is likely due to the fact that the most recent K+ supplementation was sent to the wrong pharmacy and thus repletion delayed. Either way, I would like to get this resolved, so that we do not have to keep bringing her in for repeat labs all the time. Will Cc Dr. Mariah Milling.

## 2020-11-18 ENCOUNTER — Other Ambulatory Visit: Payer: Self-pay

## 2020-11-18 MED ORDER — FARXIGA 10 MG PO TABS
10.0000 mg | ORAL_TABLET | Freq: Every day | ORAL | 3 refills | Status: DC
  Filled 2020-11-18 – 2020-11-19 (×2): qty 90, 90d supply, fill #0
  Filled 2021-02-25: qty 90, 90d supply, fill #1
  Filled ????-??-??: fill #2

## 2020-11-19 ENCOUNTER — Other Ambulatory Visit: Payer: Self-pay

## 2020-11-20 ENCOUNTER — Other Ambulatory Visit: Payer: Self-pay

## 2020-11-23 NOTE — Progress Notes (Signed)
Attempted to schedule.  LMOV to call office.  ° °

## 2020-11-30 ENCOUNTER — Other Ambulatory Visit: Payer: Self-pay

## 2020-12-02 ENCOUNTER — Other Ambulatory Visit: Payer: Self-pay

## 2020-12-07 ENCOUNTER — Other Ambulatory Visit: Payer: Self-pay

## 2020-12-21 ENCOUNTER — Other Ambulatory Visit: Payer: Self-pay

## 2020-12-21 ENCOUNTER — Other Ambulatory Visit: Payer: Self-pay | Admitting: Physician Assistant

## 2020-12-21 ENCOUNTER — Telehealth: Payer: Self-pay | Admitting: Physician Assistant

## 2020-12-22 NOTE — Telephone Encounter (Signed)
[name] D, PA-C  [facility] Triage 10 hours ago (9:44 PM)   If this message for K repletion is to replete K from labs 1 month ago, we may need to repeat labs to confirm this is still applicable?    Per JV patient will need lab work (bmp) prior to refill of Potassium to help determine if repletion is still applicable. Called the patient and lmom for the patient to contact our office.

## 2020-12-24 ENCOUNTER — Other Ambulatory Visit: Payer: Self-pay

## 2020-12-24 NOTE — Telephone Encounter (Signed)
Attempted to call pt. No answer. Lmtcb.  

## 2020-12-25 ENCOUNTER — Telehealth: Payer: Self-pay | Admitting: Physician Assistant

## 2020-12-25 NOTE — Telephone Encounter (Signed)
Cleaning out the sample bin at the front desk. The patient did not pick up Entresto 24/26 mg samples left for her on 10/23/20.  Returning to sample closet: Entresto 24/26 mg Lot: [id number] Exp: 11/2022 # 1 bottle

## 2020-12-28 NOTE — Telephone Encounter (Signed)
Attempted to call pt. No answer x 3. Lmtcb.  Third unsuccessful attempt to reach pt.  Letter mailed asking pt to contact our office to discuss plan of care.

## 2021-01-20 ENCOUNTER — Other Ambulatory Visit: Payer: Medicaid Other

## 2021-01-21 ENCOUNTER — Ambulatory Visit: Payer: Medicaid Other | Admitting: Physician Assistant

## 2021-01-25 ENCOUNTER — Other Ambulatory Visit: Payer: Self-pay

## 2021-01-29 ENCOUNTER — Ambulatory Visit: Payer: Medicaid Other | Admitting: Physician Assistant

## 2021-02-03 ENCOUNTER — Telehealth: Payer: Self-pay | Admitting: Pharmacist

## 2021-02-03 NOTE — Telephone Encounter (Signed)
--   Rhetta Mura - Wednesday, February 03, 2021 12:50 PM --Jeanene Erb [place] spoke with [facility] to check on auto fill for Farxiga--according to her with new conversion some auto fills were missed-she placed refill, allow 5-7 business days for [place] to receive.

## 2021-02-11 ENCOUNTER — Other Ambulatory Visit: Payer: Self-pay

## 2021-02-11 ENCOUNTER — Ambulatory Visit (INDEPENDENT_AMBULATORY_CARE_PROVIDER_SITE_OTHER): Payer: Self-pay

## 2021-02-11 DIAGNOSIS — I5020 Unspecified systolic (congestive) heart failure: Secondary | ICD-10-CM

## 2021-02-11 LAB — ECHOCARDIOGRAM COMPLETE
AR max vel: 1.62 cm2
AV Area VTI: 1.26 cm2
AV Area mean vel: 1.34 cm2
AV Mean grad: 4 mmHg
AV Mean grad: 4 mmHg
AV Peak grad: 6.4 mmHg
AV Peak grad: 8 mmHg
Ao pk vel: 1.26 m/s
Ao pk vel: 1.41 m/s
Area-P 1/2: 2.19 cm2
Area-P 1/2: 4.33 cm2
Calc EF: 19 %
Calc EF: 20.5 %
Height: 60 in
MV M vel: 4.9 m/s
MV Peak grad: 96 mmHg
MV VTI: 0.74 cm2
S' Lateral: 6 cm
S' Lateral: 6.7 cm
Single Plane A2C EF: 20.9 %
Single Plane A2C EF: 21.1 %
Single Plane A4C EF: 13 %
Single Plane A4C EF: 19.6 %
Weight: 2278.67 oz

## 2021-02-11 MED ORDER — PERFLUTREN LIPID MICROSPHERE
1.0000 mL | INTRAVENOUS | Status: AC | PRN
  Administered 2021-02-11: 2 mL via INTRAVENOUS

## 2021-02-19 ENCOUNTER — Encounter: Payer: Self-pay | Admitting: Physician Assistant

## 2021-02-19 ENCOUNTER — Ambulatory Visit (INDEPENDENT_AMBULATORY_CARE_PROVIDER_SITE_OTHER): Payer: Self-pay | Admitting: Physician Assistant

## 2021-02-19 ENCOUNTER — Other Ambulatory Visit: Payer: Self-pay

## 2021-02-19 VITALS — BP 132/90 | HR 100 | Ht 68.0 in | Wt 141.6 lb

## 2021-02-19 DIAGNOSIS — Z5989 Other problems related to housing and economic circumstances: Secondary | ICD-10-CM

## 2021-02-19 DIAGNOSIS — I951 Orthostatic hypotension: Secondary | ICD-10-CM

## 2021-02-19 DIAGNOSIS — Z8639 Personal history of other endocrine, nutritional and metabolic disease: Secondary | ICD-10-CM

## 2021-02-19 DIAGNOSIS — I5023 Acute on chronic systolic (congestive) heart failure: Secondary | ICD-10-CM

## 2021-02-19 DIAGNOSIS — Z79899 Other long term (current) drug therapy: Secondary | ICD-10-CM

## 2021-02-19 DIAGNOSIS — I493 Ventricular premature depolarization: Secondary | ICD-10-CM

## 2021-02-19 DIAGNOSIS — Z720 Tobacco use: Secondary | ICD-10-CM

## 2021-02-19 DIAGNOSIS — E785 Hyperlipidemia, unspecified: Secondary | ICD-10-CM

## 2021-02-19 DIAGNOSIS — Z8679 Personal history of other diseases of the circulatory system: Secondary | ICD-10-CM

## 2021-02-19 DIAGNOSIS — I255 Ischemic cardiomyopathy: Secondary | ICD-10-CM

## 2021-02-19 DIAGNOSIS — I10 Essential (primary) hypertension: Secondary | ICD-10-CM

## 2021-02-19 DIAGNOSIS — I5020 Unspecified systolic (congestive) heart failure: Secondary | ICD-10-CM

## 2021-02-19 DIAGNOSIS — I25119 Atherosclerotic heart disease of native coronary artery with unspecified angina pectoris: Secondary | ICD-10-CM

## 2021-02-19 DIAGNOSIS — R000 Tachycardia, unspecified: Secondary | ICD-10-CM

## 2021-02-19 DIAGNOSIS — I4729 Other ventricular tachycardia: Secondary | ICD-10-CM

## 2021-02-19 DIAGNOSIS — I252 Old myocardial infarction: Secondary | ICD-10-CM

## 2021-02-19 NOTE — Patient Instructions (Signed)
Medication Instructions:  Please restart your Entresto  1 bottle of sample LOT: [id number]  EXP: 06/2023 This is the last bottle we can give Please fill out the patient assistance application and bring it back to the office so we may fax to company   *If you need a refill on your cardiac medications before your next appointment, please call your pharmacy*   Lab Work: BMET & BNP  Testing/Procedures: None  Follow-Up: At [facility], you and your health needs are our priority.  As part of our continuing mission to provide you with exceptional heart care, we have created designated Provider Care Teams.  These Care Teams include your primary Cardiologist (physician) and Advanced Practice Providers (APPs -  Physician Assistants and Nurse Practitioners) who all work together to provide you with the care you need, when you need it.  We recommend signing up for the patient portal called "MyChart".  Sign up information is provided on this After Visit Summary.  MyChart is used to connect with patients for Virtual Visits (Telemedicine).  Patients are able to view lab/test results, encounter notes, upcoming appointments, etc.  Non-urgent messages can be sent to your provider as well.   To learn more about what you can do with MyChart, go to [email or web address].    Your next appointment:   After appt with Dr. Lalla Brothers, you was referred to the EP cardiologist here at our office.

## 2021-02-19 NOTE — Progress Notes (Addendum)
Office Visit    Patient Name: Elizabeth Herring Date of Encounter: 02/19/2021  PCP:  Aviva Kluver   [facility]  Cardiologist:  Julien Nordmann, MD  Advanced Practice Provider:  No care team member to display Electrophysiologist:  None   Chief Complaint    Chief Complaint  Patient presents with   Follow-up    Patient doing well.   65 y.o. female  with complex history of CAD, HFrEF (EF <20%), PHTN, MR, TR, h/o STEMI 2005 with PCI to LCx, ST/NSVT/PVCs, HTN, hyperlipidemia, obesity, current tobacco use, orthostatic hypotension, and who is being seen today for follow-up after echo.  Past Medical History    No past medical history on file. Past Surgical History:  Procedure Laterality Date   CORONARY/GRAFT ACUTE MI REVASCULARIZATION N/A 07/15/2020   Procedure: Coronary/Graft Acute MI Revascularization;  Surgeon: Alwyn Pea, MD;  Location: [facility] INVASIVE CV LAB;  Service: Cardiovascular;  Laterality: N/A;   LEFT HEART CATH AND CORONARY ANGIOGRAPHY N/A 07/15/2020   Procedure: LEFT HEART CATH AND CORONARY ANGIOGRAPHY;  Surgeon: Alwyn Pea, MD;  Location: [facility] INVASIVE CV LAB;  Service: Cardiovascular;  Laterality: N/A;    Allergies  Allergies  Allergen Reactions   Chlorhexidine     History of Present Illness    Elizabeth Herring is a 65 y.o. female with PMH as above. She is a current smoker. She is retired and formerly worked at a [employer].  She lives with her sister in [place].     She has a previous history of MI with PCI to her left circumflex in 2005. 2005 LHC showed 100% LCx, 70% LAD, 70% RCA, 0% left main disease. 2014 EF 20 to 25%, severe global hypokinesis of the LV, mild LAE, moderate to severe MR, moderate TR, mild pulmonary hypertension, mild pulmonic regurgitation. She was lost to follow-up after initial cardiology consult 11/2012 [facility] for orthostatic sx.  On 07/14/20, she presented to [facility] with code STEMI  activated.  EKG ST with diffuse ST elevation anterior laterally. Subsequent LHC by Dr. Juliann Pares showed moderate to severely depressed LVSF (estimated 25-30%), RCA with CTO, mLM with 25%s, dLAD 95%s, pLAD-dLAD 75%s, dLM-pLAD 50%s, p-mLCx 100%s with in-stent thrombosis of the stent IRA TIMI 0 flow.  Moderate collaterals were noted from left to right and left to left.  Intervention with PCI was attempted but unsuccessful.  Case aborted with recommendation for medical therapy. After cath, she noted symptoms as significantly improved.  It was noted she had a 26 beat run of V. tach with K 2.9.  HS Tn > 27,000.  She was discharged with Zoll Life vest and medial therapy.  Seen has been seen at follow-up and often either out of medications or unable to get them. She has still been smoking though cutting back. She was initially walking 20 minutes for exercise each day but this later was stopped without a regular exercise routine. 10/07/20 echo performed with EF still reduced <20% and referral to EP provided  with pt preference to defer EP referral and hesitation towards a device. Seen 10/20/2020 and volume up. She was using 3 pillows at night.  She had not been taking her Zetia or Entresto. She was tolerating the reduced dose [place]. She was taking losartan instead of Entresto.  She again declined EP pending another echo while on Entresto. She continued her LifeVest.  Diuresis was increased transiently and all medications restarted.  Subsequent echo with ongoing reduced EF <20% as below.  Today, 02/19/2021, she returns to clinic and notes that she is feeling about the same as her previous clinic visit.  She denies any chest pain or SOB with exertion or at rest.  She reports ongoing symptoms of congestion and cough, attributed to allergies. She has unchanged fatigue. At times, she has pounding heart rate. No presyncope or syncope. She has occasional LEE, improving with elevation. No orthopnea, PND, early satiety. She has  not been taking her Entresto. On review of EMR, she did not pick up provided samples. She is still smoking, reporting 1 pack of cigarettes will last her for 4 days.  She has a significant amount of anxiety surrounding receipt of her echo results today, attributing her elevated BP to this anxiety, which is understandable. She is no longer wearing a LifeVest.    Home Medications    Current Outpatient Medications on File Prior to Visit  Medication Sig Dispense Refill   aspirin 81 MG EC tablet TAKE ONE TABLET BY MOUTH EVERY DAY 90 tablet 1   atorvastatin (LIPITOR) 80 MG tablet TAKE ONE TABLET BY MOUTH EVERY DAY 90 tablet 1   cetirizine (ZYRTEC) 10 MG tablet Take 10 mg by mouth daily.     clopidogrel (PLAVIX) 75 MG tablet TAKE ONE TABLET BY MOUTH EVERY DAY 90 tablet 1   dapagliflozin propanediol (FARXIGA) 10 MG TABS tablet Take 1 tablet (10 mg total) by mouth once daily before breakfast. 90 tablet 3   dapagliflozin propanediol (FARXIGA) 5 MG TABS tablet Take 5 mg by mouth daily.     ezetimibe (ZETIA) 10 MG tablet Take 1 tablet (10 mg total) by mouth daily. 90 tablet 3   furosemide (LASIX) 20 MG tablet Take 1 tablet (20 mg total) by mouth daily. 90 tablet 3   metoprolol succinate (TOPROL-XL) 25 MG 24 hr tablet TAKE ONE TABLET BY MOUTH EVERY DAY 90 tablet 1   Multiple Vitamin (MULTI-VITAMIN) tablet Take 1 tablet by mouth daily.     potassium chloride (KLOR-CON) 10 MEQ tablet Take 4 tablets (40 meq) by mouth once daily for 3 days. Then reduce to 10 meq daily (To be taken when you take Lasix). 45 tablet 0   sacubitril-valsartan (ENTRESTO) 24-26 MG Take 1 tablet by mouth 2 (two) times daily.     spironolactone (ALDACTONE) 25 MG tablet Take 0.5 tablets (12.5 mg total) by mouth daily. 45 tablet 3   No current facility-administered medications on file prior to visit.    Review of Systems    She has an ongoing non-productive cough attributed to her allergies.  She reports occasional lower extremity edema  that improves with leg elevation.   She has fatigue. She reports pounding heart rate.  She denies chest pain, dyspnea, pnd, n, v, dizziness, syncope, weight gain, or early satiety.   All other systems reviewed and are otherwise negative except as noted above.  Physical Exam    VS:  BP 132/90   Pulse 100   Ht 5\' 8"  (1.727 m)   Wt 141 lb 9.6 oz (64.2 kg)   SpO2 100%   BMI 21.53 kg/m  , BMI Body mass index is 21.53 kg/m. GEN: Well nourished, well developed, in no acute distress.   HEENT: normal. Neck: Supple, no JVD, no carotid bruits or masses. Cardiac: Tachycardic but regular, 1/6 systolic murmur.  No rubs, or gallops. No clubbing, cyanosis.  No LEE. Radials/DP/PT 2+ and equal bilaterally.   Respiratory: CTAB. GI: Soft, nontender, nondistended, BS + x 4. MS: no  deformity or atrophy. Skin: warm and dry, no rash. Neuro:  Strength and sensation are intact. Psych: Normal affect.  Accessory Clinical Findings    ECG personally reviewed by me today no EKG.  VITALS Reviewed today   Temp Readings from Last 3 Encounters:  07/20/20 98.7 F (37.1 C) (Oral)  07/14/20 98.6 F (37 C) (Oral)   BP Readings from Last 3 Encounters:  10/20/20 130/90  08/28/20 (!) 146/90  07/27/20 108/66   Pulse Readings from Last 3 Encounters:  10/20/20 (!) 108  08/28/20 (!) 111  07/27/20 93    Wt Readings from Last 3 Encounters:  10/20/20 140 lb (63.5 kg)  08/28/20 139 lb (63 kg)  07/27/20 136 lb (61.7 kg)     LABS  reviewed today   Lab Results  Component Value Date   WBC 5.9 07/20/2020   HGB 12.1 07/20/2020   HCT 36.0 07/20/2020   MCV 104.7 (H) 07/20/2020   PLT 215 07/20/2020   Lab Results  Component Value Date   CREATININE 1.18 (H) 11/16/2020   BUN 13 11/16/2020   NA 138 11/16/2020   K 3.2 (L) 11/16/2020   CL 99 11/16/2020   CO2 29 11/16/2020   Lab Results  Component Value Date   ALT 13 08/28/2020   AST 23 08/28/2020   ALKPHOS 67 08/28/2020   BILITOT 1.1 08/28/2020   Lab  Results  Component Value Date   CHOL 207 (H) 08/28/2020   HDL 75 08/28/2020   LDLCALC 120 (H) 08/28/2020   LDLDIRECT 121.3 (H) 08/28/2020   TRIG 61 08/28/2020   CHOLHDL 2.8 08/28/2020    Lab Results  Component Value Date   HGBA1C 5.6 07/16/2020   Lab Results  Component Value Date   TSH 0.799 07/14/2020     STUDIES/PROCEDURES reviewed today   Echo 02/11/21 1. Left ventricular ejection fraction, by estimation, is <20%. The left  ventricle has severely decreased function. The left ventricle demonstrates  global hypokinesis. The left ventricular internal cavity size was  moderately dilated. Left ventricular  diastolic parameters are consistent with Grade II diastolic dysfunction  (pseudonormalization).   2. Right ventricular systolic function is moderately reduced. The right  ventricular size is normal. There is mildly elevated pulmonary artery  systolic pressure. The estimated right ventricular systolic pressure is  44.1 mmHg.   3. Left atrial size was mildly dilated.   4. Right atrial size was mildly dilated.   5. The mitral valve is normal in structure. Mild to moderate mitral valve  regurgitation. Visually there appears to be moderate mitral stenosis.  Gradient not measured by tech.   6. Tricuspid valve regurgitation is severe.   7. The inferior vena cava is normal in size with <50% respiratory  variability, suggesting right atrial pressure of 8 mmHg.   Echo  10/2020  1. Left ventricular ejection fraction, by estimation, is <20%. The left  ventricle has severely decreased function. The left ventricle demonstrates  global hypokinesis. The left ventricular internal cavity size was severely  dilated. Left ventricular  diastolic parameters are indeterminate.   2. Right ventricular systolic function is mildly reduced. The right  ventricular size is mildly enlarged. There is moderately elevated  pulmonary artery systolic pressure. The estimated right ventricular  systolic  pressure is 53.8 mmHg.   3. The mitral valve is normal in structure. Mild mitral valve  regurgitation.   4. Tricuspid valve regurgitation is moderate.   Echo 07/16/20  1. Left ventricular ejection fraction, by estimation, is <20%.  Left  ventricular ejection fraction by PLAX is 6 %. The left ventricle has  severely decreased function. The left ventricle demonstrates global  hypokinesis. The left ventricular internal  cavity size was severely dilated. Left ventricular diastolic parameters  are indeterminate.   2. Right ventricular systolic function is normal. The right ventricular  size is normal.   3. Left atrial size was mildly dilated.   4. Mild mitral valve regurgitation.   5. The inferior vena cava is normal in size with <50% respiratory  variability, suggesting right atrial pressure of 8 mmHg.   LHC 07/15/20 Prox RCA lesion is 100% stenosed. CTO Mid LM lesion is 25% stenosed. Dist LAD lesion is 95% stenosed. Prox LAD to Dist LAD lesion is 75% stenosed. Dist LM to Prox LAD lesion is 50% stenosed. Prox Cx to Mid Cx lesion is 100% stenosed. In-stent thrombosis of the stent IRA TIMI 0 flow Moderate collaterals left to right left to left Left ventricular function severely depressed left ventricular enlargement EF between 25 and 30% globally Unsuccessful attempted PCI and stent to ostial old stent to the circumflex with in-stent thrombosis failure to cross with a wire Conclusion STEMI presentation in house late recognition Troponins were greater than 27,000 EKG had diffuse ST elevation inferior laterally at admission and 24 hours later when STEMI was called Diagnostic cardiac cath showed severely depressed left ventricular function ejection fraction was between 25 and 30% with left ventricular enlargement Coronaries Left main large minor distal disease  LAD was large diffuse 50 to 75% proximal to mid, diffuse 75 to 95% mid to distal Circumflex is large ostial stent occluded  thrombotic in-stent thrombosis from an old stent TIMI 0 flow RCA medium to small a completely occluded proximally CTO Intervention Unsuccessful attempt at PCI and stent of in-stent thrombosis of old proximal stent to circumflex Unable to cross with a wire Patient developed heart failure type symptoms Treated with Lasix Groin was mynx Patient was transferred to ICU for aggressive medical therapy for heart failure and post MI care Coronary Diagrams     Diagnostic Dominance: Right    Previous echo 2014   PROCEDURE Study Quality: Technically adequate. - SUMMARY The left ventricle is severely dilated. There is normal left ventricular wall thickness.  Left ventricular systolic function is severely reduced. LV ejection fraction = 20-25%. There is severe global hypokinesis of the left ventricle. The right ventricle is normal in size and function. The left atrium is mildly dilated. There is moderate to severe mitral regurgitation. There is moderate tricuspid regurgitation. Mild pulmonary hypertension. Mild pulmonic valvular regurgitation. There is no pericardial effusion. Compared to prior study, significant changes have occured. - FINDINGS:  LEFT VENTRICLE There is normal left ventricular wall thickness. The left ventricle is  severely dilated. Left ventricular systolic function is severely reduced. LV  ejection fraction = 20-25%. Left ventricular filling pattern is pseudonormal.  Lateral E/E' suggestive of increased filling pressures. There is severe  global hypokinesis of the left ventricle. -  RIGHT VENTRICLE The right ventricle is normal in size and function.  LEFT ATRIUM The left atrium is mildly dilated.  RIGHT ATRIUM  Right atrial size is normal. - AORTIC VALVE The aortic valve is normal in structure and function. The aortic valve is  trileaflet. Trace (trivial) aortic regurgitation. - MITRAL VALVE The mitral valve leaflets appear normal. There is no  evidence of stenosis,  fluttering, or prolapse. There is moderate to severe mitral regurgitation. MR  likely function secondary to dilated cardiomyopathy. -  TRICUSPID VALVE Structurally normal tricuspid valve. There is moderate tricuspid  regurgitation. Estimated right atrial pressure is 10 mmHg.. Right ventricular  systolic pressure is elevated at 41-8mmHg. Mild pulmonary hypertension. - PULMONIC VALVE The pulmonic valve is not well visualized. Mild pulmonic valvular  regurgitation. - - VENOUS Pulmonary venous flow pattern not well visualized. IVC size was mildly  dilated. - EFFUSION There is no pericardial effusion. - - MMode/2D Measurements & Calculations IVSd: 0.90 cm LVIDd: 6.4 cm LVPWd: 1.0 cm LVIDs: 5.9 cm LA dim: 4.2 cm Ao root: 2.9 cm EDV(MOD-sp4): 143.0 ml ESV(MOD-sp4): 118.0 ml EDV(MOD-sp2): 145.0 ml ESV(MOD-sp2): 119.0 ml LVOT diam: 1.8 cm LA A2 area: 26.1 cm2 LA length (vol): 5.4 cm LA vol: 98.6 ml LA vol index: 57.2 ml/m2 LAA (a4): 23.9 cm2 RAA (a4): 16.6 cm2 Doppler Measurements & Calculations MV E max vel: 127.7 cm/sec MV A max vel: 116.6 cm/sec MV E/A: 1.1 Lat Peak E' Vel: 5.2 cm/sec E/Lat E`: 24.5 MV dec time: 0.16 sec SV(LVOT): 31.1 ml Ao max PG: 5.5 mmHg Ao mean PG: 2.3 mmHg Ao V2 VTI: 12.5 cm AVA (VTI): 2.5 cm2 LV V1 VTI: 12.2 cm TR max vel: 294.1 cm/sec TR max PG: 34.6 mmHg RVSP(TR): 44.6 mmHg RAP systole: 10.0 mmHg    Assessment & Plan    History of delayed STEMI  Coronary artery disease with previous history of STEMI/PCI (2005) Ischemic cardiomyopathy, Pulmonary HTN --Denies any chest pain at rest or with exertion. Cough reported, attributed to allergies, though with recommendation she monitor her volume status closely through BP/Wt a home. She is s/p delayed STEMI with cath 07/2020 and unsuccessful PCI.  Repeat echo(s) with ongoing reduced EF < 20%. EP referral has been delayed several times in the setting of rx noncompliance,  which I suspect is due to anxiety surrounding the ICD. As she is no longer in a LifeVest with ongoing low EF, I elected to proceed with EP referral despite the fact that she discontinued Entresto, given this seems to be an ongoing issue and a function of her anxiety. My hope is EP may ease her anxiety, and she agrees with me regarding this plan. Continue ASA 81 mg, Plavix  daily, Toprol, Lipitor, Zetia.   Continue lasix, spironolactone.  She appears volume up today. Will obtain BMET with further recommendations at that time and pending restart of Entresto to avoid multiple medication changes at once.  Continue Farxiga 5 mg qd, as she did not tolerate Farxiga 10 mg qd. Restart Entresto  24-26 BID with sample bottle provided and paperwork provided. BMET today and at RTC or in 2-4 weeks.  Medication assistance program paperwork printed again for the pt and samples provided as above. Smoking cessation recommended. Na/Fluid & Diet and activity / lifestyle changes discussed. EP referral provided, as a conversation with EP will likely reduce her anxiety surrounding EP consultation / consideration of ICD and thus increase chances for compliance.   Valvular disease --Recent echo as above. Periodic echo to monitor.   History of sinus tachycardia, NSVT, PVCs --Consider increased dose Toprol at RTC given pounding HR reported and ST today. Will defer for now as restart Entresto. Recheck BMET to ensure electrolytes at goal.   HTN, goal BP <130/80 History of orthostatic hypotension --Restart Entresto 24-26 twice daily. Continue spironolactone 12.5 mg daily. BMET today and at RTC.     HLD --Continue statin and Zetia.     Tobacco use, current --Smoking cessation recommended.    Medication changes:  --Charlaine Dalton 24-26  BID .  Labs ordered:  --BMETs Studies / Imaging / Referrals ordered:  --EP for consideration / discussion of ICD Disposition:  --RTC after EP visit, sooner if  needed.   Lennon Alstrom, PA-C 02/19/2021

## 2021-02-20 LAB — BASIC METABOLIC PANEL
BUN/Creatinine Ratio: 17 (ref 12–28)
BUN: 15 mg/dL (ref 8–27)
CO2: 26 mmol/L (ref 20–29)
Calcium: 9.7 mg/dL (ref 8.7–10.3)
Chloride: 99 mmol/L (ref 96–106)
Creatinine, Ser: 0.9 mg/dL (ref 0.57–1.00)
Glucose: 159 mg/dL — ABNORMAL HIGH (ref 70–99)
Potassium: 3 mmol/L — ABNORMAL LOW (ref 3.5–5.2)
Sodium: 142 mmol/L (ref 134–144)
eGFR: 71 mL/min/{1.73_m2} (ref >59)

## 2021-02-20 LAB — BRAIN NATRIURETIC PEPTIDE: BNP: 1547.4 pg/mL — ABNORMAL HIGH (ref 0.0–100.0)

## 2021-02-21 ENCOUNTER — Encounter: Payer: Self-pay | Admitting: Physician Assistant

## 2021-02-22 ENCOUNTER — Telehealth: Payer: Self-pay | Admitting: *Deleted

## 2021-02-22 DIAGNOSIS — I255 Ischemic cardiomyopathy: Secondary | ICD-10-CM

## 2021-02-22 NOTE — Telephone Encounter (Signed)
-----   Message from Lennon Alstrom, PA-C sent at 02/21/2021 11:25 PM EDT ----- Result note earlier was submitted before completed. Please see finalized note below (just has the repeat BMET added to it):  (1) INCREASE to KCL tab x 3 days then drop down to usual dose (2) INCREASE to lasix 40mg  x3 days then drop down to usual dose (3) INCREASE to spironolactone 25mg  daily going forward (4) REPEAT BMET, BNP in 1-2 weeks

## 2021-02-22 NOTE — Telephone Encounter (Addendum)
Attempted to call pt to discuss results and provider's recc.  No answer. Lmtcb.   Also to add to previous lab result note from Jacquelyn:  Labs show volume up with ongoing low potassium. Usually, she takes lasix 20mg  daily, KCL tab daily, spironolactone 12.5mg  daily.

## 2021-02-24 ENCOUNTER — Other Ambulatory Visit: Payer: Self-pay

## 2021-02-24 MED ORDER — POTASSIUM CHLORIDE ER 10 MEQ PO TBCR
EXTENDED_RELEASE_TABLET | ORAL | 3 refills | Status: DC
  Filled 2021-02-24: qty 18, 3d supply, fill #0
  Filled 2021-02-25: qty 18, 3d supply, fill #1
  Filled 2021-02-26: qty 90, 90d supply, fill #1
  Filled 2021-06-04: qty 90, 90d supply, fill #2

## 2021-02-24 MED ORDER — FUROSEMIDE 20 MG PO TABS
20.0000 mg | ORAL_TABLET | Freq: Every day | ORAL | 3 refills | Status: DC
  Filled 2021-02-24 – 2021-03-02 (×3): qty 90, 90d supply, fill #0
  Filled 2021-06-04: qty 90, 90d supply, fill #1

## 2021-02-24 MED ORDER — SPIRONOLACTONE 25 MG PO TABS
25.0000 mg | ORAL_TABLET | Freq: Every day | ORAL | 3 refills | Status: DC
  Filled 2021-02-24: qty 30, 30d supply, fill #0
  Filled 2021-03-30: qty 30, 30d supply, fill #1

## 2021-02-24 NOTE — Telephone Encounter (Signed)
Called patient and discussed the documentation as charted below. Patient verbalized understanding and wrote down all instructions. Patient stated that she had run out of her Potassium but will pick it up today and take the 60 MEQ for 3 days as instructed.

## 2021-02-25 ENCOUNTER — Other Ambulatory Visit: Payer: Self-pay

## 2021-02-25 ENCOUNTER — Other Ambulatory Visit: Payer: Self-pay | Admitting: Family

## 2021-02-25 MED ORDER — ASPIRIN 81 MG PO TBEC
81.0000 mg | DELAYED_RELEASE_TABLET | Freq: Every day | ORAL | 1 refills | Status: DC
  Filled 2021-02-25: qty 120, 120d supply, fill #0
  Filled 2021-07-06: qty 60, 60d supply, fill #1

## 2021-02-25 NOTE — Telephone Encounter (Signed)
Rx request sent to pharmacy.  

## 2021-02-26 ENCOUNTER — Other Ambulatory Visit: Payer: Self-pay

## 2021-03-01 ENCOUNTER — Other Ambulatory Visit: Payer: Self-pay

## 2021-03-01 ENCOUNTER — Other Ambulatory Visit: Payer: Self-pay | Admitting: Physician Assistant

## 2021-03-02 ENCOUNTER — Other Ambulatory Visit: Payer: Self-pay

## 2021-03-02 ENCOUNTER — Other Ambulatory Visit: Payer: Self-pay | Admitting: Family

## 2021-03-02 MED ORDER — ENTRESTO 24-26 MG PO TABS
1.0000 | ORAL_TABLET | Freq: Two times a day (BID) | ORAL | 1 refills | Status: DC
  Filled 2021-03-02: qty 180, 90d supply, fill #0
  Filled 2021-04-23 – 2021-06-03 (×3): qty 180, 90d supply, fill #1

## 2021-03-03 ENCOUNTER — Telehealth: Payer: Self-pay | Admitting: Pharmacist

## 2021-03-03 ENCOUNTER — Other Ambulatory Visit: Payer: Self-pay

## 2021-03-03 MED ORDER — CLOPIDOGREL BISULFATE 75 MG PO TABS
75.0000 mg | ORAL_TABLET | Freq: Every day | ORAL | 1 refills | Status: DC
  Filled 2021-03-03: qty 90, 90d supply, fill #0
  Filled 2021-06-04: qty 90, 90d supply, fill #1

## 2021-03-03 MED ORDER — ATORVASTATIN CALCIUM 80 MG PO TABS
80.0000 mg | ORAL_TABLET | Freq: Every day | ORAL | 1 refills | Status: AC
  Filled 2021-03-03: qty 90, 90d supply, fill #0
  Filled 2021-06-04: qty 90, 90d supply, fill #1

## 2021-03-03 NOTE — Telephone Encounter (Signed)
We have received patient's Entresto 24/26 (3 bottles=90 day supply). This medication has been filled 02/25/21-a message goes out to the patient, hanging in bag for patient to pick up.

## 2021-03-10 ENCOUNTER — Ambulatory Visit (INDEPENDENT_AMBULATORY_CARE_PROVIDER_SITE_OTHER): Payer: Self-pay | Admitting: Internal Medicine

## 2021-03-10 ENCOUNTER — Institutional Professional Consult (permissible substitution): Payer: Medicaid Other | Admitting: Cardiology

## 2021-03-10 ENCOUNTER — Other Ambulatory Visit: Payer: Medicaid Other

## 2021-03-10 DIAGNOSIS — I5020 Unspecified systolic (congestive) heart failure: Secondary | ICD-10-CM

## 2021-03-10 DIAGNOSIS — R000 Tachycardia, unspecified: Secondary | ICD-10-CM

## 2021-03-10 DIAGNOSIS — I255 Ischemic cardiomyopathy: Secondary | ICD-10-CM

## 2021-03-10 DIAGNOSIS — I4729 Other ventricular tachycardia: Secondary | ICD-10-CM

## 2021-03-10 DIAGNOSIS — I493 Ventricular premature depolarization: Secondary | ICD-10-CM

## 2021-03-10 NOTE — Progress Notes (Signed)
ELECTROPHYSIOLOGY CONSULT NOTE  Patient ID: Elizabeth Herring, MRN: [id number], DOB/AGE: 64-Mar-1957 64 y.o. Admit date: (Not on file) Date of Consult: 03/10/2021  Primary Physician: Pcp, No Primary Cardiologist:      Ruthell Ostrow is a 65 y.o. female who is being seen today for the evaluation of  at the request of .    HPI Elizabeth Herring is a 65 y.o. female   Hx of CAD with prior STEMI 2005; PCI LCx 3/22 STEMI with anterior elevation, failed PCI of LCx/ISR; discharged on lifeVest;  felt not to be a candidate for CABG (CE)  Started on Guideline directed medical therapy- variably compliant  Referral to EP offered and declined over the summer  DATE TEST EF   8/13 Echo   20-25 % MR mod-severe TR mod  3/22 Echo   <20 % MR  mild  3/22 LHC 20-30% LADp 75; LADd -100; LCx-p-100; ISR-total RCA-T  10/22 Echo  <20% MR mild-mod TR severe RV dys-mod        No past medical history on file.    Surgical History:  Past Surgical History:  Procedure Laterality Date   CORONARY/GRAFT ACUTE MI REVASCULARIZATION N/A 07/15/2020   Procedure: Coronary/Graft Acute MI Revascularization;  Surgeon: Yolonda Kida, MD;  Location: [facility];  Service: Cardiovascular;  Laterality: N/A;   LEFT HEART CATH AND CORONARY ANGIOGRAPHY N/A 07/15/2020   Procedure: LEFT HEART CATH AND CORONARY ANGIOGRAPHY;  Surgeon: Yolonda Kida, MD;  Location: [facility];  Service: Cardiovascular;  Laterality: N/A;     Home Meds: No outpatient medications have been marked as taking for the 03/10/21 encounter (Appointment) with Deboraha Sprang, MD.    Allergies:  Allergies  Allergen Reactions   Chlorhexidine     Social History   Socioeconomic History   Marital status: Single    Spouse name: Not on file   Number of children: Not on file   Years of education: Not on file   Highest education level: Not on file  Occupational History   Not on file  Tobacco Use    Smoking status: Every Day    Types: Cigarettes   Smokeless tobacco: Never   Tobacco comments:    Maybe 1 cig. Per day per pt---10/20/20 TW  Vaping Use   Vaping Use: Never used  Substance and Sexual Activity   Alcohol use: Yes   Drug use: Never   Sexual activity: Not on file  Other Topics Concern   Not on file  Social History Narrative   Not on file   Social Determinants of Health   Financial Resource Strain: Not on file  Food Insecurity: Not on file  Transportation Needs: Not on file  Physical Activity: Not on file  Stress: Not on file  Social Connections: Not on file  Intimate Partner Violence: Not on file     No family history on file.    Cardiac Enzymes No results for input(s): CKTOTAL, CKMB, TROPONINI in the last 72 hours. CBC Lab Results  Component Value Date   WBC 5.9 07/20/2020   HGB 12.1 07/20/2020   HCT 36.0 07/20/2020   MCV 104.7 (H) 07/20/2020   PLT 215 07/20/2020   PROTIME: No results for input(s): LABPROT, INR in the last 72 hours. Chemistry No results for input(s): NA, K, CL, CO2, BUN, CREATININE, CALCIUM, PROT, BILITOT, ALKPHOS, ALT, AST, GLUCOSE in the last 168 hours.  Invalid input(s): LABALBU Lipids Lab Results  Component Value Date  CHOL 207 (H) 08/28/2020   HDL 75 08/28/2020   LDLCALC 120 (H) 08/28/2020   TRIG 61 08/28/2020   BNP No results found for: PROBNP Thyroid Function Tests: No results for input(s): TSH, T4TOTAL, T3FREE, THYROIDAB in the last 72 hours.  Invalid input(s): FREET3 Miscellaneous No results found for: DDIMER  Radiology/Studies:  ECHOCARDIOGRAM COMPLETE  Result Date: 02/11/2021    ECHOCARDIOGRAM REPORT   Patient Name:   Elizabeth Herring Date of Exam: 02/11/2021 Medical Rec #:  [id number]             Height:       60.0 in Accession #:    [id number]            Weight:       140.0 lb Date of Birth:  1955-12-21             BSA:          1.604 m Patient Age:    80 years              BP:           122/82 mmHg Patient  Gender: F                     HR:           83 bpm. Exam Location:  [facility] Procedure: 2D Echo, Cardiac Doppler, Color Doppler and Intracardiac            Opacification Agent Indications:    I50.20* Unspecified systolic (congestive) heart failure  History:        Patient has prior history of Echocardiogram examinations, most                 recent 10/07/2020. CHF and Cardiomyopathy; Risk                 Factors:Hypertension, Former Smoker and Dyslipidemia.  Sonographer:    Caesar Chestnut RDCS, RVT Referring Phys: [id number] [place]  1. Left ventricular ejection fraction, by estimation, is <20%. The left ventricle has severely decreased function. The left ventricle demonstrates global hypokinesis. The left ventricular internal cavity size was moderately dilated. Left ventricular diastolic parameters are consistent with Grade II diastolic dysfunction (pseudonormalization).  2. Right ventricular systolic function is moderately reduced. The right ventricular size is normal. There is mildly elevated pulmonary artery systolic pressure. The estimated right ventricular systolic pressure is Q000111Q mmHg.  3. Left atrial size was mildly dilated.  4. Right atrial size was mildly dilated.  5. The mitral valve is normal in structure. Mild to moderate mitral valve regurgitation. Visually there appears to be moderate mitral stenosis. Gradient not measured by tech.  6. Tricuspid valve regurgitation is severe.  7. The inferior vena cava is normal in size with <50% respiratory variability, suggesting right atrial pressure of 8 mmHg. Comparison(s): LVEF 20%. FINDINGS  Left Ventricle: Left ventricular ejection fraction, by estimation, is <20%. The left ventricle has severely decreased function. The left ventricle demonstrates global hypokinesis. Definity contrast agent was given IV to delineate the left ventricular endocardial borders. The left ventricular internal cavity size was moderately dilated. There is no left  ventricular hypertrophy. Left ventricular diastolic parameters are consistent with Grade II diastolic dysfunction (pseudonormalization). Right Ventricle: The right ventricular size is normal. No increase in right ventricular wall thickness. Right ventricular systolic function is moderately reduced. There is mildly elevated pulmonary artery systolic pressure. The tricuspid regurgitant velocity is 2.92 m/s, and with an  assumed right atrial pressure of 10 mmHg, the estimated right ventricular systolic pressure is Q000111Q mmHg. Left Atrium: Left atrial size was mildly dilated. Right Atrium: Right atrial size was mildly dilated. Pericardium: There is no evidence of pericardial effusion. Mitral Valve: The mitral valve is normal in structure. There is moderate thickening of the mitral valve leaflet(s). Mild to moderate mitral valve regurgitation. Moderate mitral valve stenosis. MV peak gradient, 10.4 mmHg. The mean mitral valve gradient is 6.0 mmHg. Tricuspid Valve: The tricuspid valve is normal in structure. Tricuspid valve regurgitation is severe. No evidence of tricuspid stenosis. Aortic Valve: The aortic valve is normal in structure. Aortic valve regurgitation is not visualized. Mild aortic valve sclerosis is present, with no evidence of aortic valve stenosis. Aortic valve mean gradient measures 4.0 mmHg. Aortic valve peak gradient measures 8.0 mmHg. Pulmonic Valve: The pulmonic valve was normal in structure. Pulmonic valve regurgitation is mild. No evidence of pulmonic stenosis. Aorta: The aortic root is normal in size and structure. Venous: The inferior vena cava is normal in size with less than 50% respiratory variability, suggesting right atrial pressure of 8 mmHg. IAS/Shunts: No atrial level shunt detected by color flow Doppler.  LEFT VENTRICLE PLAX 2D LVIDd:         6.60 cm LVIDs:         6.00 cm LV PW:         1.10 cm LV IVS:        1.00 cm LVOT diam:     1.90 cm LVOT Area:     2.84 cm  LV Volumes (MOD) LV vol d,  MOD A2C: 246.0 ml LV vol d, MOD A4C: 138.0 ml LV vol s, MOD A2C: 194.0 ml LV vol s, MOD A4C: 111.0 ml LV SV MOD A2C:     52.0 ml LV SV MOD A4C:     138.0 ml LV SV MOD BP:      38.8 ml RIGHT VENTRICLE            IVC RV Basal diam:  4.30 cm    IVC diam: 1.60 cm RV Mid diam:    3.30 cm RV S prime:     9.08 cm/s TAPSE (M-mode): 1.6 cm LEFT ATRIUM             Index        RIGHT ATRIUM           Index LA diam:        4.40 cm 2.74 cm/m   RA Area:     22.60 cm LA Vol (A2C):   76.4 ml 47.63 ml/m  RA Volume:   75.50 ml  47.07 ml/m LA Vol (A4C):   45.1 ml 28.12 ml/m LA Biplane Vol: 59.4 ml 37.03 ml/m  AORTIC VALVE               PULMONIC VALVE AV Vmax:      141.00 cm/s  PV Vmax:          1.06 m/s AV Vmean:     100.000 cm/s PV Vmean:         61.500 cm/s AV VTI:       0.207 m      PV VTI:           0.137 m AV Peak Grad: 8.0 mmHg     PV Peak grad:     4.5 mmHg AV Mean Grad: 4.0 mmHg     PV Mean grad:     2.0  mmHg                            PR End Diast Vel: 11.70 msec AORTA Ao Root diam: 2.50 cm Ao Asc diam:  3.20 cm  TRICUSPID VALVE  TR Peak grad:   34.1 mmHg  TR Vmax:        292.00 cm/s   SHUNTS  Systemic Diam: 1.90 cm Julien Nordmann MD Electronically signed by Julien Nordmann MD Signature Date/Time: 02/11/2021/1:37:40 PM    Final

## 2021-03-30 ENCOUNTER — Other Ambulatory Visit: Payer: Self-pay

## 2021-03-30 ENCOUNTER — Other Ambulatory Visit: Payer: Self-pay | Admitting: Family

## 2021-03-30 MED ORDER — METOPROLOL SUCCINATE ER 25 MG PO TB24
25.0000 mg | ORAL_TABLET | Freq: Every day | ORAL | 1 refills | Status: DC
  Filled 2021-03-30: qty 90, 90d supply, fill #0

## 2021-03-30 NOTE — Telephone Encounter (Signed)
Rx request sent to pharmacy.  

## 2021-04-09 ENCOUNTER — Telehealth: Payer: Self-pay | Admitting: Pharmacist

## 2021-04-09 NOTE — Telephone Encounter (Signed)
04/09/2021 3:47:44 PM - Sherryll Burger refill called to [facility]  -- Rhetta Mura - Friday, April 09, 2021 3:44 PM --[employer] spoke with [place], she has placed refill, shipping to [place] Monday Dec. 5 and Deliver to [place] Wed. Dec. 7. Patient enrolled till 10/21/21.

## 2021-04-15 ENCOUNTER — Other Ambulatory Visit: Payer: Self-pay

## 2021-04-21 ENCOUNTER — Institutional Professional Consult (permissible substitution): Payer: Medicaid Other | Admitting: Cardiology

## 2021-04-22 ENCOUNTER — Other Ambulatory Visit: Payer: Self-pay

## 2021-04-23 ENCOUNTER — Other Ambulatory Visit: Payer: Self-pay

## 2021-04-26 ENCOUNTER — Other Ambulatory Visit: Payer: Self-pay

## 2021-05-05 ENCOUNTER — Other Ambulatory Visit: Payer: Self-pay

## 2021-05-13 ENCOUNTER — Other Ambulatory Visit: Payer: Self-pay

## 2021-05-13 ENCOUNTER — Telehealth: Payer: Self-pay | Admitting: Cardiovascular Disease

## 2021-05-13 MED ORDER — DAPAGLIFLOZIN PROPANEDIOL 10 MG PO TABS
10.0000 mg | ORAL_TABLET | Freq: Every day | ORAL | 3 refills | Status: DC
  Filled 2021-05-13: qty 90, 90d supply, fill #0
  Filled 2021-05-14: qty 30, 30d supply, fill #0
  Filled 2021-06-03: qty 30, 30d supply, fill #1
  Filled 2021-06-18: qty 90, 90d supply, fill #1
  Filled 2021-08-25: qty 90, 90d supply, fill #2
  Filled 2021-09-29: qty 90, 90d supply, fill #0
  Filled ????-??-??: fill #2

## 2021-05-13 NOTE — Telephone Encounter (Signed)
Patient returning call.

## 2021-05-13 NOTE — Telephone Encounter (Signed)
Called patient to confirm which dose patient is currently taking.  Per medication list patient has Farxiga 5MG  and Farxiga 10MG  No answer. LMOV.

## 2021-05-13 NOTE — Telephone Encounter (Signed)
Patient called back.  Per patient she is taking [place] 10mg  daily.

## 2021-05-13 NOTE — Telephone Encounter (Signed)
Patient would like a letter to give housing assistance program .  This letter should say it's a good idea to have a treadmill to keep up with exercise regime .     Patient states this letter will help her be approved for a 2 bedroom space so that she can work out at home.

## 2021-05-13 NOTE — Telephone Encounter (Signed)
°*  STAT* If patient is at the pharmacy, call can be transferred to refill team.   1. Which medications need to be refilled? (please list name of each medication and dose if known) farxiga  2. Which pharmacy/location (including street and city if local pharmacy) is medication to be sent to?[facility]   3. Do they need a 30 day or 90 day supply? 90  Patient has been out for a few days

## 2021-05-14 ENCOUNTER — Other Ambulatory Visit: Payer: Self-pay

## 2021-05-14 NOTE — Telephone Encounter (Signed)
Attempted to reach out to Mrs. Williams via phone,unable to make contact with pt  LMTCB  Verbal discussion with manger Harriett Sine and Dr. Mariah Milling, our office is not able to accommodate such letter pt is requesting. This will need to be a PCP matter. Though exercise and a treadmill would be great for pt, however cannot write a letter to arrange a "2nd" bedroom for housing to accommodate space for a treadmill.

## 2021-05-19 NOTE — Telephone Encounter (Signed)
Patient calling to inquire again about note Informed patient this would be a matter for PCP No more questions at this time

## 2021-06-01 NOTE — Progress Notes (Signed)
Electrophysiology Office Note:    Date:  06/02/2021   ID:  Elizabeth Herring, DOB 1955-08-06, MRN [id number]  PCP:  Pcp, No  [facility] Cardiologist:  Julien Nordmannimothy Gollan, MD  [facility] Electrophysiologist:  Lanier PrudeAMERON T Shriley Joffe, MD   Referring MD: Lennon AlstromVisser, Elizabeth D, PA*   Chief Complaint: chronic systolic heart failure  History of Present Illness:    Elizabeth Herring is a 66 y.o. female who presents for an evaluation of chronic systolic heart failure at the request of Marisue IvanJacquelyn Visser, PA-C. Their medical history includes CAD, pulm HTN, MR, TR, hx STEMI in 2005, HTN, HLD. She last saw Elizabeth on 02/19/2021. She had been previously referred to EP Graciela Husbands([place]) but I don't see any evidence that he saw her. She previously wore a lifevest.  Today she tells me that she is overall been doing well.  She tells me that she is taking her medications and she brings her pill bottles.  Her pill bottles were examined and there is a prescription of Entresto from October that still has about one half of the pills left in the bottle.  Upon further questioning she tells me that she may have "forgotten" some of her medications.    History reviewed. No pertinent past medical history.  Past Surgical History:  Procedure Laterality Date   CORONARY/GRAFT ACUTE MI REVASCULARIZATION N/A 07/15/2020   Procedure: Coronary/Graft Acute MI Revascularization;  Surgeon: Alwyn Peaallwood, Dwayne D, MD;  Location: [facility];  Service: Cardiovascular;  Laterality: N/A;   LEFT HEART CATH AND CORONARY ANGIOGRAPHY N/A 07/15/2020   Procedure: LEFT HEART CATH AND CORONARY ANGIOGRAPHY;  Surgeon: Alwyn Peaallwood, Dwayne D, MD;  Location: [facility];  Service: Cardiovascular;  Laterality: N/A;    Current Medications: Current Meds  Medication Sig   aspirin (ASPIRIN ADULT LOW STRENGTH) 81 MG EC tablet Take 1 tablet (81 mg total) by mouth once daily.   atorvastatin (LIPITOR) 80 MG tablet TAKE ONE TABLET BY MOUTH ONCE  EVERY DAY.   cetirizine (ZYRTEC) 10 MG tablet Take 10 mg by mouth daily.   clopidogrel (PLAVIX) 75 MG tablet TAKE ONE TABLET BY MOUTH EVERY DAY   dapagliflozin propanediol (FARXIGA) 10 MG TABS tablet Take 1 tablet (10 mg total) by mouth once daily before breakfast.   furosemide (LASIX) 20 MG tablet Take 2 tablets (40MG ) by mouth once daily for 3 days. Then return to 1 tablet (20MG ) by mouth once a day.   Multiple Vitamin (MULTI-VITAMIN) tablet Take 1 tablet by mouth daily.   potassium chloride (KLOR-CON) 10 MEQ tablet Take 6 tablets (60 meq) by mouth once daily for 3 days. Then reduce to 10 meq daily (To be taken when you take Lasix).   sacubitril-valsartan (ENTRESTO) 24-26 MG Take 1 tablet by mouth 2 (two) times daily.     Allergies:   Chlorhexidine   Social History   Socioeconomic History   Marital status: Single    Spouse name: Not on file   Number of children: Not on file   Years of education: Not on file   Highest education level: Not on file  Occupational History   Not on file  Tobacco Use   Smoking status: Every Day    Types: Cigarettes   Smokeless tobacco: Never   Tobacco comments:    Maybe 1 cig. Per day per pt---10/20/20 TW  Vaping Use   Vaping Use: Never used  Substance and Sexual Activity   Alcohol use: Yes   Drug use: Never   Sexual activity:  Not on file  Other Topics Concern   Not on file  Social History Narrative   Not on file   Social Determinants of Health   Financial Resource Strain: Not on file  Food Insecurity: Not on file  Transportation Needs: Not on file  Physical Activity: Not on file  Stress: Not on file  Social Connections: Not on file     Family History: The patient's family history is not on file.  ROS:   Please see the history of present illness.    All other systems reviewed and are negative.  EKGs/Labs/Other Studies Reviewed:    The following studies were reviewed today:  02/11/2021 Echo LVEF<20% RV moderately reduced Atria  dilated Mild-mod MR Severe TR   EKG:  The ekg ordered today demonstrates sinus rhythm.  Ventricular rate 96 bpm.  QTc is 495 ms.   Recent Labs: 07/14/2020: TSH 0.799 07/20/2020: Hemoglobin 12.1; Magnesium 2.0; Platelets 215 08/28/2020: ALT 13 02/19/2021: BNP 1,547.4; BUN 15; Creatinine, Ser 0.90; Potassium 3.0; Sodium 142  Recent Lipid Panel    Component Value Date/Time   CHOL 207 (H) 08/28/2020 1549   TRIG 61 08/28/2020 1549   HDL 75 08/28/2020 1549   CHOLHDL 2.8 08/28/2020 1549   VLDL 12 08/28/2020 1549   LDLCALC 120 (H) 08/28/2020 1549   LDLDIRECT 121.3 (H) 08/28/2020 1549    Physical Exam:    VS:  BP 120/72 (BP Location: Left Arm, Patient Position: Sitting, Cuff Size: Normal)    Pulse 96    Ht 5\' 8"  (1.727 m)    Wt 144 lb (65.3 kg)    SpO2 98%    BMI 21.90 kg/m     Wt Readings from Last 3 Encounters:  06/02/21 144 lb (65.3 kg)  02/19/21 141 lb 9.6 oz (64.2 kg)  10/20/20 140 lb (63.5 kg)     GEN:  Well nourished, well developed in no acute distress HEENT: Normal NECK: No JVD; No carotid bruits LYMPHATICS: No lymphadenopathy CARDIAC: RRR, no murmurs, rubs, gallops RESPIRATORY:  Clear to auscultation without rales, wheezing or rhonchi  ABDOMEN: Soft, non-tender, non-distended MUSCULOSKELETAL:  No edema; No deformity  SKIN: Warm and dry NEUROLOGIC:  Alert and oriented x 3 PSYCHIATRIC:  Normal affect       ASSESSMENT:    1. HFrEF (heart failure with reduced ejection fraction) (HCC)   2. Ischemic cardiomyopathy    PLAN:    In order of problems listed above:  #Chronic systolic heart failure NYHA class II-III.  EF severely reduced.  Unfortunately medication compliance is a big issue with Ms. Williams.  She has half a bottle of Entresto left from October that was only supposed to last for 30 days.  She readily admits that she sometimes/frequently forgets her medicines.  Because of her medication noncompliance, she is not currently a candidate for ICD therapy.  If  we are able to document medication compliance and a persistently reduced ejection fraction despite this medical therapy, could revisit the question in the future.  Continue current medications for heart failure.   Total time spent with patient today 45 minutes. This includes reviewing records, evaluating the patient and coordinating care.  Medication Adjustments/Labs and Tests Ordered: Current medicines are reviewed at length with the patient today.  Concerns regarding medicines are outlined above.  Orders Placed This Encounter  Procedures   Ambulatory referral to Cardiology   EKG 12-Lead   No orders of the defined types were placed in this encounter.    Signed, November  Chalmers Cater, MD, [facility], [facility] 06/02/2021 9:26 PM    Electrophysiology [facility]

## 2021-06-02 ENCOUNTER — Other Ambulatory Visit: Payer: Self-pay

## 2021-06-02 ENCOUNTER — Ambulatory Visit (INDEPENDENT_AMBULATORY_CARE_PROVIDER_SITE_OTHER): Payer: Self-pay | Admitting: Cardiology

## 2021-06-02 ENCOUNTER — Encounter: Payer: Self-pay | Admitting: Cardiology

## 2021-06-02 VITALS — BP 120/72 | HR 96 | Ht 68.0 in | Wt 144.0 lb

## 2021-06-02 DIAGNOSIS — I5020 Unspecified systolic (congestive) heart failure: Secondary | ICD-10-CM

## 2021-06-02 DIAGNOSIS — I255 Ischemic cardiomyopathy: Secondary | ICD-10-CM

## 2021-06-02 DIAGNOSIS — Z720 Tobacco use: Secondary | ICD-10-CM

## 2021-06-02 NOTE — Patient Instructions (Addendum)
Medications: Your physician recommends that you continue on your current medications as directed. Please refer to the Current Medication list given to you today. *If you need a refill on your cardiac medications before your next appointment, please call your pharmacy*  Lab Work: None. If you have labs (blood work) drawn today and your tests are completely normal, you will receive your results only by: MyChart Message (if you have MyChart) OR A paper copy in the mail If you have any lab test that is abnormal or we need to change your treatment, we will call you to review the results.  Testing/Procedures: None.  Follow-Up: At [facility], you and your health needs are our priority.  As part of our continuing mission to provide you with exceptional heart care, we have created designated Provider Care Teams.  These Care Teams include your primary Cardiologist (physician) and Advanced Practice Providers (APPs -  Physician Assistants and Nurse Practitioners) who all work together to provide you with the care you need, when you need it.  Your physician wants you to follow-up in: General Cardiologist   We recommend signing up for the patient portal called "MyChart".  Sign up information is provided on this After Visit Summary.  MyChart is used to connect with patients for Virtual Visits (Telemedicine).  Patients are able to view lab/test results, encounter notes, upcoming appointments, etc.  Non-urgent messages can be sent to your provider as well.   To learn more about what you can do with MyChart, go to [email or web address].    Any Other Special Instructions Will Be Listed Below (If Applicable).

## 2021-06-03 ENCOUNTER — Other Ambulatory Visit: Payer: Self-pay

## 2021-06-04 ENCOUNTER — Other Ambulatory Visit: Payer: Self-pay

## 2021-06-15 ENCOUNTER — Other Ambulatory Visit: Payer: Self-pay

## 2021-06-15 MED ORDER — OLOPATADINE HCL 0.2 % OP SOLN: OPHTHALMIC | 2 refills | Status: AC

## 2021-06-15 MED ORDER — MONTELUKAST SODIUM 10 MG PO TABS
10.0000 mg | ORAL_TABLET | Freq: Every day | ORAL | 2 refills | Status: AC
  Filled 2021-06-15: qty 30, 30d supply, fill #0
  Filled 2021-07-15: qty 30, 30d supply, fill #1
  Filled 2021-08-26: qty 30, 30d supply, fill #2

## 2021-06-18 ENCOUNTER — Other Ambulatory Visit: Payer: Self-pay

## 2021-06-21 ENCOUNTER — Other Ambulatory Visit: Payer: Self-pay

## 2021-06-28 ENCOUNTER — Other Ambulatory Visit: Payer: Self-pay

## 2021-07-02 ENCOUNTER — Ambulatory Visit (INDEPENDENT_AMBULATORY_CARE_PROVIDER_SITE_OTHER): Payer: Self-pay | Admitting: Cardiovascular Disease

## 2021-07-02 ENCOUNTER — Other Ambulatory Visit: Payer: Self-pay

## 2021-07-02 ENCOUNTER — Encounter: Payer: Self-pay | Admitting: Cardiovascular Disease

## 2021-07-02 VITALS — BP 100/60 | HR 96 | Ht 60.0 in | Wt 151.0 lb

## 2021-07-02 DIAGNOSIS — I4729 Other ventricular tachycardia: Secondary | ICD-10-CM

## 2021-07-02 DIAGNOSIS — I255 Ischemic cardiomyopathy: Secondary | ICD-10-CM

## 2021-07-02 DIAGNOSIS — I5020 Unspecified systolic (congestive) heart failure: Secondary | ICD-10-CM

## 2021-07-02 DIAGNOSIS — Z720 Tobacco use: Secondary | ICD-10-CM

## 2021-07-02 DIAGNOSIS — R000 Tachycardia, unspecified: Secondary | ICD-10-CM

## 2021-07-02 DIAGNOSIS — I493 Ventricular premature depolarization: Secondary | ICD-10-CM

## 2021-07-02 NOTE — Patient Instructions (Addendum)
Cut back on water, Do ice chip/candy  Goal weight 140  Medication Instructions:  Lasix /furosemide  up to 40 mg twice a day  Take with potassium twice a day until weight down 10 pounds (140s)   Then (when weight is back to 140s) Lasix 40 mg in the Am with extra 40 mg as needed after lunch for leg swelling Take extra potassium if you take extra lasix  Sherryll Burger is twice a day  If you need a refill on your cardiac medications before your next appointment, please call your pharmacy.   Lab work: No new labs needed  Testing/Procedures: No new testing needed  Follow-Up: At [facility], you and your health needs are our priority.  As part of our continuing mission to provide you with exceptional heart care, we have created designated Provider Care Teams.  These Care Teams include your primary Cardiologist (physician) and Advanced Practice Providers (APPs -  Physician Assistants and Nurse Practitioners) who all work together to provide you with the care you need, when you need it.  You will need a follow up appointment in 1 month. APP ok  Providers on your designated Care Team:   Nicolasa Ducking, NP Eula Listen, PA-C Cadence Fransico Michael, [place]  COVID-19 Vaccine Information can be found at: [email or web address] For questions related to vaccine distribution or appointments, please email [email or web address] or call [phone].

## 2021-07-02 NOTE — Progress Notes (Signed)
Cardiology Office Note  Date:  07/02/2021   ID:  Elizabeth Herring, DOB Mar 16, 1956, MRN [id number]  PCP:  Pcp, No   Chief Complaint  Patient presents with   1 month follow up     Patient c/o no energy, constipation, LE edema, shortness of breath, sleeping all the time and has a dry cough. Medications reviewed by the patient verbally.     HPI:  Elizabeth Herring is a 66 y.o. female with PMH  smoker.   CAD  MI with PCI to her left circumflex in 2005.  2005 LHC showed 100% LCx, 70% LAD, 70% RCA, 0% left main disease.  2014 EF 20 to 25%, severe global hypokinesis of the LV, moderate to severe MR, moderate TR 07/14/20, code STEMI  LHC by Dr. Juliann Pares showed moderate to severely depressed LVSF (estimated 25-30%), RCA with CTO, mLM with 25%s, dLAD 95%s, pLAD-dLAD 75%s, dLM-pLAD 50%s, p-mLCx 100%s with in-stent thrombosis of the stent IRA TIMI 0 flow.  Moderate collaterals were noted from left to right and left to left.  Intervention with PCI was attempted but unsuccessful.  26 beat run of V. tach with K 2.9.  HS Tn > 27,000.   discharged with Zoll Life vest  Declined for ICD by EP secondary to medication noncompliance Who presents for follow-up of her ischemic cardiomyopathy, ejection fraction 20%  In follow-up today she brings her medications with her Taking Lipitor 80, aspirin, Plavix 75 daily, Lasix 20 daily with potassium 10 daily Entresto Biaxin only taking Entresto once a day  Reports having very high water intake Weight up 11 pounds in the past 4 months Has worsening leg swelling, cough, PND orthopnea  It appears that she does not have her metoprolol or Farxiga with her Not on spironolactone  EKG personally reviewed by myself on todays visit Normal sinus rhythm rate 96 bpm old anterior MI, consider old inferior MI   Cardiac catheterization Prox RCA lesion is 100% stenosed. CTO Mid LM lesion is 25% stenosed. Dist LAD lesion is 95% stenosed. Prox LAD to Dist LAD lesion  is 75% stenosed. Dist LM to Prox LAD lesion is 50% stenosed. Prox Cx to Mid Cx lesion is 100% stenosed. In-stent thrombosis of the stent IRA TIMI 0 flow Moderate collaterals left to right left to left Left ventricular function severely depressed left ventricular enlargement EF between 25 and 30% globally Unsuccessful attempted PCI and stent to ostial old stent to the circumflex with in-stent thrombosis failure to cross with a wire    STEMI presentation in house late recognition Troponins were greater than 27,000 EKG had diffuse ST elevation inferior laterally at admission and 24 hours later when STEMI was called Diagnostic cardiac cath showed severely depressed left ventricular function ejection fraction was between 25 and 30% with left ventricular enlargement Coronaries Left main large minor distal disease  LAD was large diffuse 50 to 75% proximal to mid, diffuse 75 to 95% mid to distal Circumflex is large ostial stent occluded thrombotic in-stent thrombosis from an old stent TIMI 0 flow RCA medium to small a completely occluded proximally CTO   Intervention Unsuccessful attempt at PCI and stent of in-stent thrombosis of old proximal stent to circumflex   PMH:   has no past medical history on file.  PSH:    Past Surgical History:  Procedure Laterality Date   CORONARY/GRAFT ACUTE MI REVASCULARIZATION N/A 07/15/2020   Procedure: Coronary/Graft Acute MI Revascularization;  Surgeon: Alwyn Pea, MD;  Location: [facility] INVASIVE CV LAB;  Service:  Cardiovascular;  Laterality: N/A;   LEFT HEART CATH AND CORONARY ANGIOGRAPHY N/A 07/15/2020   Procedure: LEFT HEART CATH AND CORONARY ANGIOGRAPHY;  Surgeon: Alwyn Pea, MD;  Location: [facility] INVASIVE CV LAB;  Service: Cardiovascular;  Laterality: N/A;    Current Outpatient Medications  Medication Sig Dispense Refill   aspirin (ASPIRIN ADULT LOW STRENGTH) 81 MG EC tablet Take 1 tablet (81 mg total) by mouth once daily. 90 tablet 1    atorvastatin (LIPITOR) 80 MG tablet TAKE ONE TABLET BY MOUTH ONCE EVERY DAY. 90 tablet 1   cetirizine (ZYRTEC) 10 MG tablet Take 10 mg by mouth daily.     clopidogrel (PLAVIX) 75 MG tablet TAKE ONE TABLET BY MOUTH EVERY DAY 90 tablet 1   dapagliflozin propanediol (FARXIGA) 10 MG TABS tablet Take 1 tablet (10 mg total) by mouth once daily before breakfast. 90 tablet 3   furosemide (LASIX) 20 MG tablet Take 40 mg by mouth 2 (two) times daily.     Multiple Vitamin (MULTI-VITAMIN) tablet Take 1 tablet by mouth daily.     Olopatadine HCl (PATADAY) 0.2 % SOLN Instill 1 drop in both eyes once daily. 5 mL 2   potassium chloride (MICRO-K) 10 MEQ CR capsule Take 10 mEq by mouth daily.     sacubitril-valsartan (ENTRESTO) 24-26 MG Take 1 tablet by mouth 2 (two) times daily. 180 tablet 1   montelukast (SINGULAIR) 10 MG tablet Take 1 tablet by mouth once daily. (Patient not taking: Reported on 07/02/2021) 30 tablet 2   spironolactone (ALDACTONE) 25 MG tablet Take 1 tablet (25 mg total) by mouth once daily. (Patient not taking: Reported on 06/02/2021) 90 tablet 3   No current facility-administered medications for this visit.     Allergies:   Chlorhexidine   Social History:  The patient  reports that she has been smoking cigarettes. She has been smoking an average of 1 pack per day. She has never used smokeless tobacco. She reports current alcohol use. She reports that she does not use drugs.   Family History:   family history is not on file.    Review of Systems: Review of Systems  Constitutional: Negative.        Weight gain  HENT: Negative.    Respiratory:  Positive for cough and shortness of breath.   Cardiovascular:  Positive for leg swelling.  Gastrointestinal: Negative.   Musculoskeletal: Negative.   Neurological: Negative.   Psychiatric/Behavioral: Negative.    All other systems reviewed and are negative.   PHYSICAL EXAM: VS:  BP 100/60 (BP Location: Left Arm, Patient Position: Sitting,  Cuff Size: Normal)    Pulse 96    Ht 5' (1.524 m)    Wt 151 lb (68.5 kg)    SpO2 98%    BMI 29.49 kg/m  , BMI Body mass index is 29.49 kg/m. GEN: Well nourished, well developed, in no acute distress HEENT: normal Neck: no JVD, carotid bruits, or masses Cardiac: RRR; no murmurs, rubs, or gallops, 1-2+ pitting lower extremity edema to the thighs Respiratory:  clear to auscultation bilaterally, normal work of breathing GI: soft, nontender, nondistended, + BS MS: no deformity or atrophy Skin: warm and dry, no rash Neuro:  Strength and sensation are intact Psych: euthymic mood, full affect   Recent Labs: 07/14/2020: TSH 0.799 07/20/2020: Hemoglobin 12.1; Magnesium 2.0; Platelets 215 08/28/2020: ALT 13 02/19/2021: BNP 1,547.4; BUN 15; Creatinine, Ser 0.90; Potassium 3.0; Sodium 142    Lipid Panel Lab Results  Component Value Date  CHOL 207 (H) 08/28/2020   HDL 75 08/28/2020   LDLCALC 120 (H) 08/28/2020   TRIG 61 08/28/2020      Wt Readings from Last 3 Encounters:  07/02/21 151 lb (68.5 kg)  06/02/21 144 lb (65.3 kg)  02/19/21 141 lb 9.6 oz (64.2 kg)      ASSESSMENT AND PLAN:  Problem List Items Addressed This Visit       Cardiology Problems   Ischemic cardiomyopathy   Relevant Medications   furosemide (LASIX) 20 MG tablet   Other Visit Diagnoses     HFrEF (heart failure with reduced ejection fraction) (HCC)    -  Primary   Relevant Medications   furosemide (LASIX) 20 MG tablet   Other Relevant Orders   EKG 12-Lead   Current tobacco use       NSVT (nonsustained ventricular tachycardia)       Relevant Medications   furosemide (LASIX) 20 MG tablet   PVC (premature ventricular contraction)       Relevant Medications   furosemide (LASIX) 20 MG tablet   Sinus tachycardia          Coronary artery disease with stable angina To medication noncompliance, is not taking spironolactone, metoprolol, Farxiga Recommend she continue the medication she is taking with change  to diuretics as detailed below She is on aspirin Plavix Lipitor  Ischemic cardiomyopathy  Acute on chronic diastolic and systolic CHF noted on today's visit  weight up 11 pounds She has high water intake, recommend she decreases drastically We will increase Lasix up to 40 twice daily with potassium 10 twice daily until weight down 10 pounds Then back to Lasix 40 daily potassium daily with extra 40 as needed for 3 pound weight gain Long discussion concerning CHF management, fluid restriction, salt restriction, medication compliance  Smoker  Complete cessation recommended Reports smoking 1 pack every week and a half    Bronchitis/COPD  Smoking cessation recommended   Total encounter time more than 40 minutes  Greater than 50% was spent in counseling and coordination of care with the patient    Signed, Dossie Arbour, M.D., Ph.D. [facility] [place], [place] [phone]

## 2021-07-06 ENCOUNTER — Other Ambulatory Visit: Payer: Self-pay

## 2021-07-15 ENCOUNTER — Other Ambulatory Visit: Payer: Self-pay

## 2021-07-16 ENCOUNTER — Other Ambulatory Visit: Payer: Self-pay

## 2021-07-19 ENCOUNTER — Other Ambulatory Visit: Payer: Self-pay

## 2021-07-22 ENCOUNTER — Telehealth: Payer: Self-pay | Admitting: Cardiovascular Disease

## 2021-07-22 ENCOUNTER — Other Ambulatory Visit: Payer: Self-pay

## 2021-07-22 MED ORDER — FUROSEMIDE 20 MG PO TABS
40.0000 mg | ORAL_TABLET | Freq: Two times a day (BID) | ORAL | 0 refills | Status: DC
  Filled 2021-07-22: qty 60, 15d supply, fill #0

## 2021-07-22 NOTE — Telephone Encounter (Signed)
?*  STAT* If patient is at the pharmacy, call can be transferred to refill team. ? ? ?1. Which medications need to be refilled? (please list name of each medication and dose if known) Lasix 20mg  take 2 daily ? ?2. Which pharmacy/location (including street and city if local pharmacy) is medication to be sent to? [facility] ? ?3. Do they need a 30 day or 90 day supply? 90 day ?

## 2021-07-22 NOTE — Telephone Encounter (Cosign Needed)
Left a VM requesting a call back.

## 2021-07-22 NOTE — Telephone Encounter (Signed)
Called patient and informed her that we do not carry samples of Lasix. Also informed her that we did send the refill into [facility]. ?

## 2021-07-22 NOTE — Telephone Encounter (Signed)
*  STAT* If patient is at the pharmacy, call can be transferred to refill team.   1. Which medications need to be refilled? (please list name of each medication and dose if known) lasix  2. Which pharmacy/location (including street and city if local pharmacy) is medication to be sent to? [facility] 3. Do they need a 30 day or 90 day supply? 90

## 2021-07-22 NOTE — Telephone Encounter (Signed)
Patient is requesting samples of Lasix only has 1 left. Refill sent & appt made for 3/27 ?

## 2021-07-23 ENCOUNTER — Other Ambulatory Visit: Payer: Self-pay

## 2021-07-26 ENCOUNTER — Other Ambulatory Visit: Payer: Self-pay

## 2021-08-01 ENCOUNTER — Inpatient Hospital Stay (HOSPITAL_COMMUNITY)
Admission: EM | Admit: 2021-08-01 | Discharge: 2021-08-04 | DRG: 378 | Disposition: A | Discharge status: Home / Self Care | Payer: Self-pay | Source: Other Acute Inpatient Hospital | Attending: Internal Medicine | Admitting: Internal Medicine

## 2021-08-01 DIAGNOSIS — I110 Hypertensive heart disease with heart failure: Secondary | ICD-10-CM | POA: Diagnosis present

## 2021-08-01 DIAGNOSIS — D62 Acute posthemorrhagic anemia: Secondary | ICD-10-CM | POA: Diagnosis present

## 2021-08-01 DIAGNOSIS — K922 Gastrointestinal hemorrhage, unspecified: Secondary | ICD-10-CM | POA: Diagnosis present

## 2021-08-01 DIAGNOSIS — K921 Melena: Principal | ICD-10-CM | POA: Diagnosis present

## 2021-08-01 DIAGNOSIS — I255 Ischemic cardiomyopathy: Secondary | ICD-10-CM | POA: Diagnosis present

## 2021-08-01 DIAGNOSIS — E785 Hyperlipidemia, unspecified: Secondary | ICD-10-CM | POA: Diagnosis present

## 2021-08-01 DIAGNOSIS — R531 Weakness: Secondary | ICD-10-CM

## 2021-08-01 DIAGNOSIS — D509 Iron deficiency anemia, unspecified: Secondary | ICD-10-CM | POA: Diagnosis present

## 2021-08-01 DIAGNOSIS — I2510 Atherosclerotic heart disease of native coronary artery without angina pectoris: Secondary | ICD-10-CM | POA: Diagnosis present

## 2021-08-01 DIAGNOSIS — E7800 Pure hypercholesterolemia, unspecified: Secondary | ICD-10-CM | POA: Diagnosis present

## 2021-08-01 DIAGNOSIS — Z7902 Long term (current) use of antithrombotics/antiplatelets: Secondary | ICD-10-CM

## 2021-08-01 DIAGNOSIS — Z7982 Long term (current) use of aspirin: Secondary | ICD-10-CM

## 2021-08-01 DIAGNOSIS — Z883 Allergy status to other anti-infective agents status: Secondary | ICD-10-CM

## 2021-08-01 DIAGNOSIS — F17210 Nicotine dependence, cigarettes, uncomplicated: Secondary | ICD-10-CM | POA: Diagnosis present

## 2021-08-01 DIAGNOSIS — Z955 Presence of coronary angioplasty implant and graft: Secondary | ICD-10-CM

## 2021-08-01 DIAGNOSIS — Z79899 Other long term (current) drug therapy: Secondary | ICD-10-CM

## 2021-08-01 DIAGNOSIS — I459 Conduction disorder, unspecified: Secondary | ICD-10-CM | POA: Diagnosis present

## 2021-08-01 DIAGNOSIS — I5042 Chronic combined systolic (congestive) and diastolic (congestive) heart failure: Secondary | ICD-10-CM | POA: Diagnosis present

## 2021-08-01 HISTORY — DX: Essential (primary) hypertension: I10

## 2021-08-01 HISTORY — DX: Hyperlipidemia, unspecified: E78.5

## 2021-08-01 HISTORY — DX: Chronic systolic (congestive) heart failure: I50.22

## 2021-08-01 HISTORY — DX: Atherosclerotic heart disease of native coronary artery without angina pectoris: I25.10

## 2021-08-01 LAB — CBC WITH DIFFERENTIAL/PLATELET
Abs Immature Granulocytes: 0.02 10*3/uL (ref 0.00–0.07)
Basophils Absolute: 0 10*3/uL (ref 0.0–0.1)
Basophils Relative: 1 %
Eosinophils Absolute: 0 10*3/uL (ref 0.0–0.5)
Eosinophils Relative: 1 %
HCT: 28.9 % — ABNORMAL LOW (ref 36.0–46.0)
Hemoglobin: 9.6 g/dL — ABNORMAL LOW (ref 12.0–15.0)
Immature Granulocytes: 1 %
Lymphocytes Relative: 18 %
Lymphs Abs: 0.6 10*3/uL — ABNORMAL LOW (ref 0.7–4.0)
MCH: 29.2 pg (ref 26.0–34.0)
MCHC: 33.2 g/dL (ref 30.0–36.0)
MCV: 87.8 fL (ref 80.0–100.0)
Monocytes Absolute: 0.5 10*3/uL (ref 0.1–1.0)
Monocytes Relative: 14 %
Neutro Abs: 2.3 10*3/uL (ref 1.7–7.7)
Neutrophils Relative %: 65 %
Platelets: 175 10*3/uL (ref 150–400)
RBC: 3.29 MIL/uL — ABNORMAL LOW (ref 3.87–5.11)
RDW: 21.2 % — ABNORMAL HIGH (ref 11.5–15.5)
Smear Review: ADEQUATE
WBC: 3.5 10*3/uL — ABNORMAL LOW (ref 4.0–10.5)
nRBC: 0 % (ref 0.0–0.2)

## 2021-08-01 LAB — BASIC METABOLIC PANEL
Anion gap: 11 (ref 5–15)
BUN: 30 mg/dL — ABNORMAL HIGH (ref 8–23)
CO2: 29 mmol/L (ref 22–32)
Calcium: 9.7 mg/dL (ref 8.9–10.3)
Chloride: 100 mmol/L (ref 98–111)
Creatinine, Ser: 1.34 mg/dL — ABNORMAL HIGH (ref 0.44–1.00)
GFR, Estimated: 44 mL/min — ABNORMAL LOW (ref >60)
Glucose, Bld: 93 mg/dL (ref 70–99)
Potassium: 3.7 mmol/L (ref 3.5–5.1)
Sodium: 140 mmol/L (ref 135–145)

## 2021-08-01 LAB — TYPE AND SCREEN
ABO/RH(D): B POS
Antibody Screen: NEGATIVE

## 2021-08-01 MED ORDER — ACETAMINOPHEN 325 MG PO TABS: 650.0000 mg | ORAL_TABLET | Freq: Four times a day (QID) | ORAL | Status: DC | PRN

## 2021-08-01 MED ORDER — ACETAMINOPHEN 650 MG RE SUPP: 650.0000 mg | Freq: Four times a day (QID) | RECTAL | Status: DC | PRN

## 2021-08-01 MED ORDER — PANTOPRAZOLE SODIUM 40 MG IV SOLR: 40.0000 mg | Freq: Two times a day (BID) | INTRAVENOUS | Status: DC

## 2021-08-01 MED ORDER — PANTOPRAZOLE INFUSION (NEW) - SIMPLE MED
8.0000 mg/h | INTRAVENOUS | Status: DC
  Administered 2021-08-02 – 2021-08-04 (×4): 8 mg/h via INTRAVENOUS
  Filled 2021-08-01 (×4): qty 80
  Filled 2021-08-01: qty 100

## 2021-08-01 NOTE — ED Provider Notes (Signed)
?[name] EMERGENCY DEPARTMENT ?Provider Note ? ? ?CSN: [id number] ?Arrival date & time: 08/01/21  2215 ? ?  ? ?History ? ?Chief Complaint  ?Patient presents with  ? Abdominal Pain  ? ? ?Elizabeth Herring is a 66 y.o. female with a hx of HTN, hypercholesterolemia, CHF, and ischemic cardiomyopathy who arrives to the ED via transfer from [facility] for admission for GI bleed.  Patient states that she has been feeling generalized weakness over the past 1 week, last night she felt a bit lightheaded/dizzy, given her symptoms she decided to go to the emergency department.  She states that they told her she was having a GI bleed, gave her blood products, and transferred her for admission here. She currently feels tired but has no other complaints. She denies fever, nausea, vomiting, hematemesis, melena, hematochezia, abdominal pain, chest pain, dyspnea, or syncope. Patient reports prior colonoscopy 10+ years ago that she thinks was normal- this was in [place], does not actively see GI outpatient. Takes plavix/aspirin, no additional anticoagulation.  ? ?HPI ? ?  ? ?Home Medications ?Prior to Admission medications   ?Medication Sig Start Date End Date Taking? Authorizing Provider  ?aspirin (ASPIRIN ADULT LOW STRENGTH) 81 MG EC tablet Take 1 tablet (81 mg total) by mouth once daily. 02/25/21   Antonieta Iba, MD  ?atorvastatin (LIPITOR) 80 MG tablet TAKE ONE TABLET BY MOUTH ONCE EVERY DAY. 03/03/21 03/03/22  Alver Sorrow, NP  ?cetirizine (ZYRTEC) 10 MG tablet Take 10 mg by mouth daily.    [provider]  ?clopidogrel (PLAVIX) 75 MG tablet TAKE ONE TABLET BY MOUTH EVERY DAY 03/03/21   Alver Sorrow, NP  ?dapagliflozin propanediol (FARXIGA) 10 MG TABS tablet Take 1 tablet (10 mg total) by mouth once daily before breakfast. 05/13/21   Gollan, Tollie Pizza, MD  ?furosemide (LASIX) 20 MG tablet Take 2 tablets (40 mg total) by mouth 2 (two) times daily as directed. 07/22/21    Antonieta Iba, MD  ?montelukast (SINGULAIR) 10 MG tablet Take 1 tablet by mouth once daily. ?Patient not taking: Reported on 07/02/2021 06/14/21     ?Multiple Vitamin (MULTI-VITAMIN) tablet Take 1 tablet by mouth daily.    [provider]  ?Olopatadine HCl (PATADAY) 0.2 % SOLN Instill 1 drop in both eyes once daily. 06/14/21     ?potassium chloride (MICRO-K) 10 MEQ CR capsule Take 10 mEq by mouth daily.    [provider]  ?sacubitril-valsartan (ENTRESTO) 24-26 MG Take 1 tablet by mouth 2 (two) times daily. 03/02/21   [name] D, PA-C  ?spironolactone (ALDACTONE) 25 MG tablet Take 1 tablet (25 mg total) by mouth once daily. ?Patient not taking: Reported on 06/02/2021 02/24/21 02/19/22  [name] D, PA-C  ?   ? ?Allergies    ?Chlorhexidine   ? ?Review of Systems   ?Review of Systems  ?Constitutional:  Negative for chills and fever.  ?Respiratory:  Negative for shortness of breath.   ?Cardiovascular:  Negative for chest pain.  ?Gastrointestinal:  Negative for abdominal pain, blood in stool, diarrhea, nausea and vomiting.  ?Neurological:  Positive for dizziness and weakness. Negative for syncope.  ?All other systems reviewed and are negative. ? ?Physical Exam ?Updated Vital Signs ?BP 132/84   Pulse 81   Temp 98.3 ?F (36.8 ?C)   Resp 20   SpO2 98%  ?Physical Exam ?Vitals and nursing note reviewed.  ?Constitutional:   ?   General: She is not in acute distress. ?  Appearance: She is well-developed. She is not toxic-appearing.  ?HENT:  ?   Head: Normocephalic and atraumatic.  ?Eyes:  ?   General:     ?   Right eye: No discharge.     ?   Left eye: No discharge.  ?   Conjunctiva/sclera: Conjunctivae normal.  ?Cardiovascular:  ?   Rate and Rhythm: Normal rate and regular rhythm.  ?Pulmonary:  ?   Effort: No respiratory distress.  ?   Breath sounds: Normal breath sounds. No wheezing or rales.  ?Abdominal:  ?   General: There is no distension.  ?   Palpations: Abdomen is soft.  ?    Tenderness: There is no abdominal tenderness.  ?Musculoskeletal:  ?   Cervical back: Neck supple.  ?Skin: ?   General: Skin is warm and dry.  ?Neurological:  ?   Mental Status: She is alert.  ?   Comments: Clear speech.   ?Psychiatric:     ?   Behavior: Behavior normal.  ? ? ?ED Results / Procedures / Treatments   ?Labs ?(all labs ordered are listed, but only abnormal results are displayed) ?Labs Reviewed  ?CBC WITH DIFFERENTIAL/PLATELET  ?BASIC METABOLIC PANEL  ?TYPE AND SCREEN  ? ? ?EKG ?None ? ?Radiology ?No results found. ? ?Procedures ?Procedures  ? ? ?Medications Ordered in ED ?Medications - No data to display ? ?ED Course/ Medical Decision Making/ A&P ?  ?                        ?Medical Decision Making ?Amount and/or Complexity of Data Reviewed ?Labs: ordered. ? ?Risk ?Decision regarding hospitalization. ? ? ?Patient presents to the ED with complaints of GI bleed requiring admission from alternative facility, this involves an extensive number of treatment options, and is a complaint that carries with it a high risk of complications and morbidity. Nontoxic, vitals unremarkable. Not hypotensive. .  ? ?Additional history obtained:  ?Chart & nursing note reviewed.  ?Paperwork brought with patient from [employer] for additional hx:  ?Presented early this AM for above complaints, found to have hgb 6.3, guiac positive stool however was documented to be brown/green- no melena or BRBPR noted in documentation. 2 Units PRBC ordered and administered. Care was discussed w/ [facility] Hospitalist Dr. Margo Aye who recommended ED to ED transfer of the patient with subequent admission given patient's BP was soft at that time.  ? ?Lab Tests:  ?I reviewed & interpreted labs including:  ?Current CBC with hemoglobin 9.6 and hematocrit 28.9.  Mild leukopenia ?BMP: Elevated creatinine and BUN similar to labs earlier today from outside facility.  ? ?23:30: CONSULT: I discussed with hospitalist Dr. Arlean Hopping- accepts admission.  ? ?I  messaged GI team for AM consultation for this patient.  ? ?Portions of this note were generated with Scientist, clinical (histocompatibility and immunogenetics). Dictation errors may occur despite best attempts at proofreading. ? ? ?Final Clinical Impression(s) / ED Diagnoses ?Final diagnoses:  ?Gastrointestinal hemorrhage, unspecified gastrointestinal hemorrhage type  ? ? ?Rx / DC Orders ?ED Discharge Orders   ? ? None  ? ?  ? ? ?  ?Cherly Anderson, PA-C ?08/02/21 1610 ? ?  ?Charlynne Pander, MD ?08/03/21 1556 ? ?

## 2021-08-01 NOTE — ED Triage Notes (Signed)
Pt arrived via [facility] transport from [facility]. Per transfer paperwork, Dr.Hall, TRH, accepted ED to ED due to hypotension and need for GI specialist. Pt reports NV since 2am 08/01/21. Guaiac positive and hbg 6.0. Pt denies complaints now other than feeling hungry.  ?

## 2021-08-02 ENCOUNTER — Ambulatory Visit: Payer: Self-pay | Admitting: Medical

## 2021-08-02 ENCOUNTER — Encounter (HOSPITAL_COMMUNITY): Payer: Self-pay | Admitting: Internal Medicine

## 2021-08-02 DIAGNOSIS — E785 Hyperlipidemia, unspecified: Secondary | ICD-10-CM | POA: Diagnosis present

## 2021-08-02 DIAGNOSIS — K922 Gastrointestinal hemorrhage, unspecified: Secondary | ICD-10-CM | POA: Diagnosis present

## 2021-08-02 DIAGNOSIS — R531 Weakness: Secondary | ICD-10-CM

## 2021-08-02 DIAGNOSIS — I5042 Chronic combined systolic (congestive) and diastolic (congestive) heart failure: Secondary | ICD-10-CM | POA: Diagnosis present

## 2021-08-02 DIAGNOSIS — D62 Acute posthemorrhagic anemia: Secondary | ICD-10-CM | POA: Diagnosis present

## 2021-08-02 LAB — COMPREHENSIVE METABOLIC PANEL
ALT: 33 U/L (ref 0–44)
AST: 57 U/L — ABNORMAL HIGH (ref 15–41)
Albumin: 3.6 g/dL (ref 3.5–5.0)
Alkaline Phosphatase: 125 U/L (ref 38–126)
Anion gap: 12 (ref 5–15)
BUN: 30 mg/dL — ABNORMAL HIGH (ref 8–23)
CO2: 26 mmol/L (ref 22–32)
Calcium: 9.9 mg/dL (ref 8.9–10.3)
Chloride: 103 mmol/L (ref 98–111)
Creatinine, Ser: 1.29 mg/dL — ABNORMAL HIGH (ref 0.44–1.00)
GFR, Estimated: 46 mL/min — ABNORMAL LOW (ref >60)
Glucose, Bld: 88 mg/dL (ref 70–99)
Potassium: 3.6 mmol/L (ref 3.5–5.1)
Sodium: 141 mmol/L (ref 135–145)
Total Bilirubin: 3.1 mg/dL — ABNORMAL HIGH (ref 0.3–1.2)
Total Protein: 6.8 g/dL (ref 6.5–8.1)

## 2021-08-02 LAB — FERRITIN: Ferritin: 21 ng/mL (ref 11–307)

## 2021-08-02 LAB — MAGNESIUM: Magnesium: 2.4 mg/dL (ref 1.7–2.4)

## 2021-08-02 LAB — HEMOGLOBIN AND HEMATOCRIT, BLOOD
HCT: 29.1 % — ABNORMAL LOW (ref 36.0–46.0)
HCT: 29.6 % — ABNORMAL LOW (ref 36.0–46.0)
HCT: 32.9 % — ABNORMAL LOW (ref 36.0–46.0)
Hemoglobin: 9.6 g/dL — ABNORMAL LOW (ref 12.0–15.0)
Hemoglobin: 9.9 g/dL — ABNORMAL LOW (ref 12.0–15.0)
Hemoglobin: 10.3 g/dL — ABNORMAL LOW (ref 12.0–15.0)

## 2021-08-02 LAB — PROTIME-INR
INR: 1.3 — ABNORMAL HIGH (ref 0.8–1.2)
Prothrombin Time: 16.4 seconds — ABNORMAL HIGH (ref 11.4–15.2)

## 2021-08-02 LAB — IRON AND TIBC
Iron: 48 ug/dL (ref 28–170)
Saturation Ratios: 11 % (ref 10.4–31.8)
TIBC: 442 ug/dL (ref 250–450)
UIBC: 394 ug/dL

## 2021-08-02 LAB — RETICULOCYTES
Immature Retic Fract: 34.6 % — ABNORMAL HIGH (ref 2.3–15.9)
RBC.: 3.34 MIL/uL — ABNORMAL LOW (ref 3.87–5.11)
Retic Count, Absolute: 61.8 10*3/uL (ref 19.0–186.0)
Retic Ct Pct: 1.9 % (ref 0.4–3.1)

## 2021-08-02 LAB — FOLATE: Folate: 22.2 ng/mL (ref >5.9)

## 2021-08-02 LAB — CBC WITH DIFFERENTIAL/PLATELET
Abs Immature Granulocytes: 0.01 10*3/uL (ref 0.00–0.07)
Basophils Absolute: 0 10*3/uL (ref 0.0–0.1)
Basophils Relative: 1 %
Eosinophils Absolute: 0 10*3/uL (ref 0.0–0.5)
Eosinophils Relative: 1 %
HCT: 29.5 % — ABNORMAL LOW (ref 36.0–46.0)
Hemoglobin: 9.8 g/dL — ABNORMAL LOW (ref 12.0–15.0)
Immature Granulocytes: 0 %
Lymphocytes Relative: 14 %
Lymphs Abs: 0.6 10*3/uL — ABNORMAL LOW (ref 0.7–4.0)
MCH: 29.3 pg (ref 26.0–34.0)
MCHC: 33.2 g/dL (ref 30.0–36.0)
MCV: 88.3 fL (ref 80.0–100.0)
Monocytes Absolute: 0.4 10*3/uL (ref 0.1–1.0)
Monocytes Relative: 11 %
Neutro Abs: 2.9 10*3/uL (ref 1.7–7.7)
Neutrophils Relative %: 73 %
Platelets: 172 10*3/uL (ref 150–400)
RBC: 3.34 MIL/uL — ABNORMAL LOW (ref 3.87–5.11)
RDW: 21.2 % — ABNORMAL HIGH (ref 11.5–15.5)
WBC: 4 10*3/uL (ref 4.0–10.5)
nRBC: 0 % (ref 0.0–0.2)

## 2021-08-02 LAB — ABO/RH: ABO/RH(D): B POS

## 2021-08-02 LAB — TSH: TSH: 4.527 u[IU]/mL — ABNORMAL HIGH (ref 0.350–4.500)

## 2021-08-02 MED ORDER — OLOPATADINE HCL 0.1 % OP SOLN
1.0000 [drp] | Freq: Every day | OPHTHALMIC | Status: DC
  Administered 2021-08-02: 1 [drp] via OPHTHALMIC
  Filled 2021-08-02 (×2): qty 5

## 2021-08-02 MED ORDER — PEG 3350-KCL-NA BICARB-NACL 420 G PO SOLR
4000.0000 mL | Freq: Once | ORAL | Status: AC
  Administered 2021-08-02: 4000 mL via ORAL
  Filled 2021-08-02: qty 4000

## 2021-08-02 MED ORDER — SODIUM CHLORIDE 0.9 % IV SOLN: INTRAVENOUS | Status: DC

## 2021-08-02 MED ORDER — MONTELUKAST SODIUM 10 MG PO TABS
10.0000 mg | ORAL_TABLET | Freq: Every day | ORAL | Status: DC
  Administered 2021-08-02 – 2021-08-03 (×2): 10 mg via ORAL
  Filled 2021-08-02 (×2): qty 1

## 2021-08-02 MED ORDER — ATORVASTATIN CALCIUM 80 MG PO TABS
80.0000 mg | ORAL_TABLET | Freq: Every day | ORAL | Status: DC
  Administered 2021-08-02 – 2021-08-03 (×2): 80 mg via ORAL
  Filled 2021-08-02 (×2): qty 1

## 2021-08-02 NOTE — Consult Note (Signed)
UNASSIGNED PATEINT ?Reason for Consult: Anemia with guaiac positive stools. ?Referring Physician: [facility] hospitalist. ? ?Elizabeth Herring is an 66 y.o. female.  ?HPI: Elizabeth Herring is a 66 year old black female with a history of coronary artery disease status post PCI with stent placement in 2014 and again in 201 07/15/2020 complicated by chronic systolic and diastolic heart failure, hyperlipidemia was admitted on 08/01/2021 after transfer from [facility] ER with concerns of severe anemia with guaiac positive stools causing her severe weakness. She was noted to have with a drop of hemoglobin from 12 g/dL to 6.3 g/dL.  Patient denies having diarrhea, constipation, nausea vomiting or abdominal pain.  She received 2 units of packed red blood cells and is doing well with a hemoglobin 9.6 g/dL today.  GI consultation has been procured for further work-up. ? ?Past Medical History:  ?Diagnosis Date  ? Chronic systolic heart failure (HCC)   ? Coronary artery disease   ? Hyperlipidemia   ? Hypertension   ? ?Past Surgical History:  ?Procedure Laterality Date  ? CORONARY/GRAFT ACUTE MI REVASCULARIZATION N/A 07/15/2020  ? Procedure: Coronary/Graft Acute MI Revascularization;  Surgeon: Alwyn Pea, MD;  Location: [facility] INVASIVE CV LAB;  Service: Cardiovascular;  Laterality: N/A;  ? LEFT HEART CATH AND CORONARY ANGIOGRAPHY N/A 07/15/2020  ? Procedure: LEFT HEART CATH AND CORONARY ANGIOGRAPHY;  Surgeon: Alwyn Pea, MD;  Location: [facility] INVASIVE CV LAB;  Service: Cardiovascular;  Laterality: N/A;  ? ?History reviewed. No pertinent family history. ? ?Social History:  reports that she has been smoking cigarettes. She has been smoking an average of 1 pack per day. She has never used smokeless tobacco. She reports current alcohol use. She reports that she does not use drugs. ? ?Allergies:  ?Allergies  ?Allergen Reactions  ? Chlorhexidine   ? ?Medications: I have reviewed the patient's current medications. ?Prior to  Admission:  ?Medications Prior to Admission  ?Medication Sig Dispense Refill Last Dose  ? aspirin (ASPIRIN ADULT LOW STRENGTH) 81 MG EC tablet Take 1 tablet (81 mg total) by mouth once daily. 90 tablet 1 07/31/2021  ? atorvastatin (LIPITOR) 80 MG tablet TAKE ONE TABLET BY MOUTH ONCE EVERY DAY. 90 tablet 1 07/31/2021  ? clopidogrel (PLAVIX) 75 MG tablet TAKE ONE TABLET BY MOUTH EVERY DAY 90 tablet 1 07/31/2021 at 0900  ? furosemide (LASIX) 20 MG tablet Take 2 tablets (40 mg total) by mouth 2 (two) times daily as directed. 60 tablet 0 07/31/2021  ? montelukast (SINGULAIR) 10 MG tablet Take 1 tablet by mouth once daily. 30 tablet 2 07/31/2021  ? Multiple Vitamin (MULTI-VITAMIN) tablet Take 1 tablet by mouth daily.   07/31/2021  ? Olopatadine HCl (PATADAY) 0.2 % SOLN Instill 1 drop in both eyes once daily. 5 mL 2 07/31/2021  ? potassium chloride (MICRO-K) 10 MEQ CR capsule Take 10 mEq by mouth daily.   07/31/2021  ? sacubitril-valsartan (ENTRESTO) 24-26 MG Take 1 tablet by mouth 2 (two) times daily. 180 tablet 1 07/31/2021  ? cetirizine (ZYRTEC) 10 MG tablet Take 10 mg by mouth daily. (Patient not taking: Reported on 08/01/2021)   Not Taking  ? dapagliflozin propanediol (FARXIGA) 10 MG TABS tablet Take 1 tablet (10 mg total) by mouth once daily before breakfast. (Patient not taking: Reported on 08/01/2021) 90 tablet 3 Not Taking  ? spironolactone (ALDACTONE) 25 MG tablet Take 1 tablet (25 mg total) by mouth once daily. (Patient not taking: Reported on 06/02/2021) 90 tablet 3   ? ?Scheduled: ? atorvastatin  80 mg Oral QHS  ? montelukast  10 mg Oral QHS  ? olopatadine  1 drop Both Eyes Q1200  ? [START ON 08/05/2021] pantoprazole  40 mg Intravenous Q12H  ? ?Continuous: ? pantoprazole 8 mg/hr (08/02/21 1500)  ? ? ?Results for orders placed or performed during the hospital encounter of 08/01/21 (from the past 48 hour(s))  ?CBC with Differential/Platelet     Status: Abnormal  ? Collection Time: 08/01/21 10:37 PM  ?Result Value Ref Range   ? WBC 3.5 (L) 4.0 - 10.5 K/uL  ? RBC 3.29 (L) 3.87 - 5.11 MIL/uL  ? Hemoglobin 9.6 (L) 12.0 - 15.0 g/dL  ? HCT 28.9 (L) 36.0 - 46.0 %  ? MCV 87.8 80.0 - 100.0 fL  ? MCH 29.2 26.0 - 34.0 pg  ? MCHC 33.2 30.0 - 36.0 g/dL  ? RDW 21.2 (H) 11.5 - 15.5 %  ? Platelets 175 150 - 400 K/uL  ? nRBC 0.0 0.0 - 0.2 %  ? Neutrophils Relative % 65 %  ? Neutro Abs 2.3 1.7 - 7.7 K/uL  ? Lymphocytes Relative 18 %  ? Lymphs Abs 0.6 (L) 0.7 - 4.0 K/uL  ? Monocytes Relative 14 %  ? Monocytes Absolute 0.5 0.1 - 1.0 K/uL  ? Eosinophils Relative 1 %  ? Eosinophils Absolute 0.0 0.0 - 0.5 K/uL  ? Basophils Relative 1 %  ? Basophils Absolute 0.0 0.0 - 0.1 K/uL  ? WBC Morphology MORPHOLOGY UNREMARKABLE   ? Smear Review PLATELETS APPEAR ADEQUATE   ? Immature Granulocytes 1 %  ? Abs Immature Granulocytes 0.02 0.00 - 0.07 K/uL  ? Target Cells PRESENT   ? Giant PLTs PRESENT   ?  Comment: Performed at [facility], [address] [place], [place] [address]  ?Basic metabolic panel     Status: Abnormal  ? Collection Time: 08/01/21 10:37 PM  ?Result Value Ref Range  ? Sodium 140 135 - 145 mmol/L  ? Potassium 3.7 3.5 - 5.1 mmol/L  ? Chloride 100 98 - 111 mmol/L  ? CO2 29 22 - 32 mmol/L  ? Glucose, Bld 93 70 - 99 mg/dL  ?  Comment: Glucose reference range applies only to samples taken after fasting for at least 8 hours.  ? BUN 30 (H) 8 - 23 mg/dL  ? Creatinine, Ser 1.34 (H) 0.44 - 1.00 mg/dL  ? Calcium 9.7 8.9 - 10.3 mg/dL  ? GFR, Estimated 44 (L) >60 mL/min  ?  Comment: (NOTE) ?Calculated using the CKD-EPI Creatinine Equation (2021) ?  ? Anion gap 11 5 - 15  ?  Comment: Performed at [facility], [address] [place], [place] [address]  ?Type and screen Ordered by PROVIDER DEFAULT     Status: None  ? Collection Time: 08/01/21 11:00 PM  ?Result Value Ref Range  ? ABO/RH(D) B POS   ? Antibody Screen NEG   ? Sample Expiration    ?  08/04/2021,2359 ?Performed at [facility], [address] [place], [place] [address] ?  ?ABO/Rh      Status: None  ? Collection Time: 08/01/21 11:30 PM  ?Result Value Ref Range  ? ABO/RH(D)    ?  B POS ?Performed at [facility], [address] [place], [place] [address] ?  ?Magnesium     Status: None  ? Collection Time: 08/02/21  3:51 AM  ?Result Value Ref Range  ? Magnesium 2.4 1.7 - 2.4 mg/dL  ?  Comment: Performed at [facility], [address], [place], [place] [address]  ?Comprehensive metabolic panel     Status: Abnormal  ? Collection Time: 08/02/21  3:51 AM  ?Result Value Ref Range  ? Sodium 141 135 - 145 mmol/L  ? Potassium 3.6 3.5 - 5.1 mmol/L  ? Chloride 103 98 - 111 mmol/L  ? CO2 26 22 - 32 mmol/L  ? Glucose, Bld 88 70 - 99 mg/dL  ?  Comment: Glucose reference range applies only to samples taken after fasting for at least 8 hours.  ? BUN 30 (H) 8 - 23 mg/dL  ? Creatinine, Ser 1.29 (H) 0.44 - 1.00 mg/dL  ? Calcium 9.9 8.9 - 10.3 mg/dL  ? Total Protein 6.8 6.5 - 8.1 g/dL  ? Albumin 3.6 3.5 - 5.0 g/dL  ? AST 57 (H) 15 - 41 U/L  ? ALT 33 0 - 44 U/L  ? Alkaline Phosphatase 125 38 - 126 U/L  ? Total Bilirubin 3.1 (H) 0.3 - 1.2 mg/dL  ? GFR, Estimated 46 (L) >60 mL/min  ?  Comment: (NOTE) ?Calculated using the CKD-EPI Creatinine Equation (2021) ?  ? Anion gap 12 5 - 15  ?  Comment: Performed at [facility], [address], [place], [place] [address]  ?CBC with Differential/Platelet     Status: Abnormal  ? Collection Time: 08/02/21  3:51 AM  ?Result Value Ref Range  ? WBC 4.0 4.0 - 10.5 K/uL  ? RBC 3.34 (L) 3.87 - 5.11 MIL/uL  ? Hemoglobin 9.8 (L) 12.0 - 15.0 g/dL  ? HCT 29.5 (L) 36.0 - 46.0 %  ? MCV 88.3 80.0 - 100.0 fL  ? MCH 29.3 26.0 - 34.0 pg  ? MCHC 33.2 30.0 - 36.0 g/dL  ? RDW 21.2 (H) 11.5 - 15.5 %  ? Platelets 172 150 - 400 K/uL  ? nRBC 0.0 0.0 - 0.2 %  ? Neutrophils Relative % 73 %  ? Neutro Abs 2.9 1.7 - 7.7 K/uL  ? Lymphocytes Relative 14 %  ? Lymphs Abs 0.6 (L) 0.7 - 4.0 K/uL  ? Monocytes Relative 11 %  ? Monocytes Absolute 0.4 0.1 - 1.0 K/uL  ? Eosinophils Relative 1 %  ?  Eosinophils Absolute 0.0 0.0 - 0.5 K/uL  ? Basophils Relative 1 %  ? Basophils Absolute 0.0 0.0 - 0.1 K/uL  ? Immature Granulocytes 0 %  ? Abs Immature Granulocytes 0.01 0.00 - 0.07 K/uL  ?  Comment: Perform

## 2021-08-02 NOTE — Assessment & Plan Note (Signed)
   #)   Hyperlipidemia: documented h/o such. On high intensity atorvastatin as outpatient.    Plan: continue home statin.      

## 2021-08-02 NOTE — Assessment & Plan Note (Signed)
#)   Acute Upper GI Bleed: diagnosis on the basis of presenting 3 to 4 days of generalized weakness associated with dizziness, with elevated BUN 30 compared to most recent prior value of 15 in October 2022, exceeding proportionate interval increase in serum creatinine level,  and with DRE reportedly revealing Hemoccult positive stool. Also a/w evidence of acute blood loss anemia, with presenting Hgb noted to be 6.3 at [facility], relative to baseline hemoglobin range of 11-12, with most recent prior value noted to be 12.1 in March 2022.  Of note, hemoglobin has improved to 9.6 following interval administration of transfusion of 2 units PRBC, as above. ?  ?On chronic dual antiplatelet therapy, as above. Denies NSAID use. No known history of known underlying liver disease, and denies any history of alcohol abuse or recent alcohol consumption.  Most recent colonoscopy occurred approximately 10 years ago at [facility] in routine screening capacity, although patient is unsure if she has ever undergone EGD, noting no history of prior gastrointestinal bleed. ?  ?In the absence of known liver disease, initiation of SBP prophylaxis does not appear to be warranted. Presentation and history are less suggestive of variceal bleed, and therefore there does not appear to be an indication for octreotide. Given suspected upper GI source, will initiate Protonix drip. At this time ddx broadly includes, but is not limited to gastritis vs erosive esophagitis vs PUD (gastric versus duodenal distribution) vs Dieulafoy lesion vs AVM.  ?  ?At this time, the patient appears hemodynamically stable, with borderline hypotension noted at [facility] improving following interval transfusion of 2 units PRBC, with normotensive blood pressures noted throughout her ED course at [facility], without any associated tachycardia.  Additionally, she is currently asymptomatic, noting interval resolution dizziness following transfusion of 2 units  PRBC prior to transfer.Of note, patient was typed and screened in the ED today.  ?  ? EDP consulted on-call [facility] gastroenterologist, with additional recs pending at this time.  ?  ?  ?  ?Plan: NPO. Refraining from pharmacologic DVT prophylaxis. Monitor on telemetry. Monitor continuous pulse-ox. Maintain at least 2 large bore IV's. Check INR. Q4H H&H's have been ordered through 9 AM on 08/02/2021. Will closely monitor these ensuing Hgb levels and correlate these data points with the patient's overall clinical picture including vital signs to determine need for subsequent transfusion. On-call GI consulted, as above, with additional recs pending at this time. Further evaluation and management of corresponding acute blood loss anemia, including adding-on iron studies to pre-transfusion specimen, as further detailed below. CMP in the AM. Protonix drip.  Holding home baby aspirin and Plavix for now.  Holding home oral iron supplementation for now. ?  ?  ?

## 2021-08-02 NOTE — H&P (Addendum)
?History and Physical  ? ? ?PLEASE NOTE THAT [employer]AGON DICTATION SOFTWARE WAS USED IN THE CONSTRUCTION OF THIS NOTE. ? ? ?Elizabeth Herring [id number] DOB: 1955-05-27 DOA: 08/01/2021 ? ?PCP: Pcp, No (will further assess) ?Patient coming from: home  ? ?I have personally briefly reviewed patient's old medical records in [facility] ? ?Chief Complaint: Generalized weakness ? ?HPI: Elizabeth Herring is a 66 y.o. female with medical history significant for coronary artery disease status post PCI with stent placement in 2014 followed by PCI on 07/15/2020, chronic systolic/diastolic heart failure, hyperlipidemia, who is admitted to [facility] on 08/01/2021 by way of transfer from [facility] emergency department with concern for acute upper gastrointestinal bleed after presenting from home to the latter facility complaining of generalized weakness.  ? ? ?The patient had presented to [facility] emergency department on 07/31/2021 complaining of 3 to 4 days of generalized weakness, without any associated acute focal weakness, acute focal numbness, paresthesias, facial droop, slurred speech, expressive aphasia, acute change in vision, dysphagia, vertigo.  Denies any associated subjective fever, chills, rigors, or generalized myalgias.  No headache, neck stiffness, cough, abdominal pain, rash, diarrhea, melena, hematochezia, dysuria or gross hematuria.  She also denies any recent chest pain, shortness of breath, palpitations, presyncope, or syncope.  She reports mild intermittent dizziness over that timeframe. ? ?Laboratory evaluation at [facility] emergency department is notable for hemoglobin of 6.3.  Per chart review, this is relative to the patient's baseline hemoglobin range of 11-12, with most recent prior hemoglobin value noted to be 12.1 on 07/20/2020.  DRE performed at [facility] emergency department revealed brown stool, but with fecal occult blood positive finding.  Systolic  blood pressures were borderline low, reportedly in the 90s mmHg. she subsequently received transfusion of 2 units PRBC, with ensuing improvement in systolic blood pressures in the 120s, corresponding with interval resolution of aforementioned dizziness.  As [facility] had no gastroenterology coverage available, ED to ED transfer ensued, with patient being transferred to [facility] emergency department for further evaluation and management of suspected acute upper gastrointestinal bleed.  ? ?Per my discussions with her upon arrival at [facility] emergency department, the patient confirms interval resolution of aforementioned dizziness, and is otherwise asymptomatic at this time.   ? ?No prior history of suspected gastrointestinal bleed, and conveys the most recent prior colonoscopy occurred approximately 10 years ago at [facility].  She reports that this colonoscopy was routine/screening in nature, and reports that the scope was not associated any significant pathology. ? ?The patient denies any regular consumption of alcohol, and denies any known history of underlying chronic liver disease.  In the setting of aforementioned coronary artery disease, she reports that she is on chronic dual antiplatelet therapy with daily baby aspirin as well as Plavix, with most recent doses occurring on the morning of 07/31/2021.  Otherwise, denies use of blood thinners as an outpatient. No regular or recent use of NSAIDs.  Outpatient medications also notable for daily oral potassium supplementation. ? ?Denies any personal history of malignancy, and denies any recent unintentional weight loss, dysphagia, or early satiety. ? ?Per chart review, baseline creatinine range is 0.9-1.2. ? ? ? ? ?[facility] Course:  ?Vital signs in the ED were notable for the following: Afebrile; heart rate 81-93; blood pressure 120/79 - 138/86; respiratory rate 17-23, oxygen saturation 95 to 98% on room air. ? ?Labs were notable for the following:  BMP notable for the following: Sodium 140, BUN 30  compared to most recent prior BUN value of 15 in October 2022, creatinine 1.34, glucose 93.  CBC notable for hemoglobin 9.6 associated with normocytic/normochromic findings as well as mildly elevated RDW.  Type and screen completed at [facility] ED today. ? ?Imaging and additional notable ED work-up: EKG shows sinus rhythm with heart rate 86, nonspecific intraventricular conduction delay, and no evidence of T wave or ST changes, including no evidence of ST elevation. ? ?EDP contacted on-call [facility] gastroenterology, who will consult, with additional recommendations pending at this time. ? ?Subsequently, the patient was admitted for overnight observation for suspected acute upper gastrointestinal bleed associated with acute blood loss anemia, after presenting with complaint of generalized weakness. ? ? ? ?Review of Systems: As per HPI otherwise 10 point review of systems negative.  ? ?Past Medical History:  ?Diagnosis Date  ? Chronic systolic heart failure (HCC)   ? Coronary artery disease   ? Hyperlipidemia   ? Hypertension   ? ? ?Past Surgical History:  ?Procedure Laterality Date  ? CORONARY/GRAFT ACUTE MI REVASCULARIZATION N/A 07/15/2020  ? Procedure: Coronary/Graft Acute MI Revascularization;  Surgeon: Alwyn Pea, MD;  Location: [facility] INVASIVE CV LAB;  Service: Cardiovascular;  Laterality: N/A;  ? LEFT HEART CATH AND CORONARY ANGIOGRAPHY N/A 07/15/2020  ? Procedure: LEFT HEART CATH AND CORONARY ANGIOGRAPHY;  Surgeon: Alwyn Pea, MD;  Location: [facility] INVASIVE CV LAB;  Service: Cardiovascular;  Laterality: N/A;  ? ? ?Social History: ? reports that she has been smoking cigarettes. She has been smoking an average of 1 pack per day. She has never used smokeless tobacco. She reports current alcohol use. She reports that she does not use drugs. ? ? ?Allergies  ?Allergen Reactions  ? Chlorhexidine   ? ? ?History reviewed. No pertinent family history. ? ?Family  history reviewed and not pertinent  ? ? ?Prior to Admission medications   ?Medication Sig Start Date End Date Taking? Authorizing Provider  ?aspirin (ASPIRIN ADULT LOW STRENGTH) 81 MG EC tablet Take 1 tablet (81 mg total) by mouth once daily. 02/25/21  Yes Antonieta Iba, MD  ?atorvastatin (LIPITOR) 80 MG tablet TAKE ONE TABLET BY MOUTH ONCE EVERY DAY. 03/03/21 03/03/22 Yes Alver Sorrow, NP  ?clopidogrel (PLAVIX) 75 MG tablet TAKE ONE TABLET BY MOUTH EVERY DAY 03/03/21  Yes Alver Sorrow, NP  ?furosemide (LASIX) 20 MG tablet Take 2 tablets (40 mg total) by mouth 2 (two) times daily as directed. 07/22/21  Yes Gollan, Tollie Pizza, MD  ?montelukast (SINGULAIR) 10 MG tablet Take 1 tablet by mouth once daily. 06/14/21  Yes   ?Multiple Vitamin (MULTI-VITAMIN) tablet Take 1 tablet by mouth daily.   Yes [provider]  ?Olopatadine HCl (PATADAY) 0.2 % SOLN Instill 1 drop in both eyes once daily. 06/14/21  Yes   ?potassium chloride (MICRO-K) 10 MEQ CR capsule Take 10 mEq by mouth daily.   Yes [provider]  ?sacubitril-valsartan (ENTRESTO) 24-26 MG Take 1 tablet by mouth 2 (two) times daily. 03/02/21  Yes [name] D, PA-C  ?cetirizine (ZYRTEC) 10 MG tablet Take 10 mg by mouth daily. ?Patient not taking: Reported on 08/01/2021    [provider]  ?dapagliflozin propanediol (FARXIGA) 10 MG TABS tablet Take 1 tablet (10 mg total) by mouth once daily before breakfast. ?Patient not taking: Reported on 08/01/2021 05/13/21   Antonieta Iba, MD  ?spironolactone (ALDACTONE) 25 MG tablet Take 1 tablet (25 mg total) by mouth once daily. ?Patient not taking: Reported  on 06/02/2021 02/24/21 02/19/22  [name] D, PA-C  ? ? ? ?Objective  ? ? ?Physical Exam: ?Vitals:  ? 08/01/21 2245 08/01/21 2307 08/01/21 2315 08/02/21 0045  ?BP:  120/79 138/86 130/85  ?Pulse: 88 86  87  ?Resp: 17 (!) 23 (!) 33 (!) 27  ?Temp:      ?SpO2: 98% 95%  98%  ? ? ?General: appears to be stated age; alert,  oriented ?Skin: warm, dry, no rash ?Head:  AT/[place] ?Mouth:  Oral mucosa membranes appear moist, normal dentition ?Neck: supple; trachea midline ?Heart:  RRR; did not appreciate any M/R/G ?Lungs: CTAB, did not apprec

## 2021-08-02 NOTE — ED Notes (Signed)
Pt asking about food and drink. Pt re-educated about remaining NPO ?

## 2021-08-02 NOTE — Progress Notes (Deleted)
?Cardiology Office Note:   ? ?Date:  08/02/2021  ? ?ID:  Elizabeth Herring, DOB November 05, 1955, MRN [id number] ? ?PCP:  Pcp, No  ?[facility] Cardiologist:  Ida Rogue, MD  ?[facility] Electrophysiologist:  Vickie Epley, MD  ? ?Referring MD: No ref. provider found  ? ?Chief Complaint: 1 month follow-up ? ?History of Present Illness:   ? ?Elizabeth Herring is a 66 y.o. female with a hx of CAD, HFrEF (EF <20%), PHTN, MR, TR, h/o STEMI 2005 with PCI to LCx, ST/NSVT/PVCs, HTN, hyperlipidemia, obesity, current tobacco use, orthostatic hypotension, and who is being seen today for follow-up. ? ?She has a previous history of MI with PCI to her left circumflex in 2005. 2005 LHC showed 100% LCx, 70% LAD, 70% RCA, 0% left main disease. 2014 EF 20 to 25%, severe global hypokinesis of the LV, mild LAE, moderate to severe MR, moderate TR, mild pulmonary hypertension, mild pulmonic regurgitation. She was lost to follow-up after initial cardiology consult 11/2012 [facility] for orthostatic sx.  On 07/14/20, she presented to [facility] with code STEMI activated.  EKG ST with diffuse ST elevation anterior laterally. Subsequent LHC by Dr. Clayborn Bigness showed moderate to severely depressed LVSF (estimated 25-30%), RCA with CTO, mLM with 25%s, dLAD 95%s, pLAD-dLAD 75%s, dLM-pLAD 50%s, p-mLCx 100%s with in-stent thrombosis of the stent IRA TIMI 0 flow.  Moderate collaterals were noted from left to right and left to left.  Intervention with PCI was attempted but unsuccessful.  Case aborted with recommendation for medical therapy. After cath, she noted symptoms as significantly improved.  It was noted she had a 26 beat run of V. tach with K 2.9.  HS Tn > 27,000.  She was discharged with Zoll Life vest and medial therapy. ?  ?Seen has been seen at follow-up and often either out of medications or unable to get them. She has still been smoking though cutting back. She was initially walking 20 minutes for exercise each day but this later was  stopped without a regular exercise routine. 10/07/20 echo performed with EF still reduced <20% and referral to EP provided  with pt preference to defer EP referral and hesitation towards a device. Seen 10/20/2020 and volume up. She was using 3 pillows at night.  She had not been taking her Zetia or Entresto. She was tolerating the reduced dose [place]. She was taking losartan instead of Entresto.  She again declined EP pending another echo while on Entresto. She continued her LifeVest.  Diuresis was increased transiently and all medications restarted. ?  ?Subsequent echo with ongoing reduced EF <20% as below. ? ?She saw EP 06/02/21 for device consideration but deferred until patient could demonstrate medication compliance.  ? ?She was seen 07/02/21 and was 11lbs up. Lasix was increased to 40mg  daily.  ? ?Today,  ? ? ? ?Past Medical History:  ?Diagnosis Date  ? Chronic systolic heart failure ([place])   ? Coronary artery disease   ? Hyperlipidemia   ? Hypertension   ? ? ?Past Surgical History:  ?Procedure Laterality Date  ? CORONARY/GRAFT ACUTE MI REVASCULARIZATION N/A 07/15/2020  ? Procedure: Coronary/Graft Acute MI Revascularization;  Surgeon: Yolonda Kida, MD;  Location: [place] [facility];  Service: Cardiovascular;  Laterality: N/A;  ? LEFT HEART CATH AND CORONARY ANGIOGRAPHY N/A 07/15/2020  ? Procedure: LEFT HEART CATH AND CORONARY ANGIOGRAPHY;  Surgeon: Yolonda Kida, MD;  Location: [place] [facility];  Service: Cardiovascular;  Laterality: N/A;  ? ? ?Current Medications: ?No outpatient medications  have been marked as taking for the 08/02/21 encounter (Appointment) with Kathlen Mody, Emonni Depasquale H, PA-C.  ?  ? ?Allergies:   Chlorhexidine  ? ?Social History  ? ?Socioeconomic History  ? Marital status: Single  ?  Spouse name: Not on file  ? Number of children: Not on file  ? Years of education: Not on file  ? Highest education level: Not on file  ?Occupational History  ? Not on file  ?Tobacco Use  ? Smoking status: Every  Day  ?  Packs/day: 1.00  ?  Types: Cigarettes  ? Smokeless tobacco: Never  ? Tobacco comments:  ?  Maybe 1 cig. Per day per pt---10/20/20 TW  ?Vaping Use  ? Vaping Use: Never used  ?Substance and Sexual Activity  ? Alcohol use: Yes  ? Drug use: Never  ? Sexual activity: Not on file  ?Other Topics Concern  ? Not on file  ?Social History Narrative  ? Not on file  ? ?Social Determinants of Health  ? ?Financial Resource Strain: Not on file  ?Food Insecurity: Not on file  ?Transportation Needs: Not on file  ?Physical Activity: Not on file  ?Stress: Not on file  ?Social Connections: Not on file  ?  ? ?Family History: ?The patient's family history is not on file. ? ?ROS:   ?Please see the history of present illness.    ? All other systems reviewed and are negative. ? ?EKGs/Labs/Other Studies Reviewed:   ? ?The following studies were reviewed today: ? ?Echo 2022 ? 1. Left ventricular ejection fraction, by estimation, is <20%. The left  ?ventricle has severely decreased function. The left ventricle demonstrates  ?global hypokinesis. The left ventricular internal cavity size was  ?moderately dilated. Left ventricular  ?diastolic parameters are consistent with Grade II diastolic dysfunction  ?(pseudonormalization).  ? 2. Right ventricular systolic function is moderately reduced. The right  ?ventricular size is normal. There is mildly elevated pulmonary artery  ?systolic pressure. The estimated right ventricular systolic pressure is  ?[id number] mmHg.  ? 3. Left atrial size was mildly dilated.  ? 4. Right atrial size was mildly dilated.  ? 5. The mitral valve is normal in structure. Mild to moderate mitral valve  ?regurgitation. Visually there appears to be moderate mitral stenosis.  ?Gradient not measured by tech.  ? 6. Tricuspid valve regurgitation is severe.  ? 7. The inferior vena cava is normal in size with <50% respiratory  ?variability, suggesting right atrial pressure of 8 mmHg.  ? ?Comparison(s): LVEF 20%.  ? ?FINDINGS   ? ?LHC 07/2020 ?Prox RCA lesion is 100% stenosed. CTO ?Mid LM lesion is 25% stenosed. ?Dist LAD lesion is 95% stenosed. ?Prox LAD to Dist LAD lesion is 75% stenosed. ?Dist LM to Prox LAD lesion is 50% stenosed. ?Prox Cx to Mid Cx lesion is 100% stenosed. In-stent thrombosis of the stent IRA TIMI 0 flow ?Moderate collaterals left to right left to left ?Left ventricular function severely depressed left ventricular enlargement EF between 25 and 30% globally ?Unsuccessful attempted PCI and stent to ostial old stent to the circumflex with in-stent thrombosis failure to cross with a wire ?  ?Conclusion ?STEMI presentation in house late recognition ?Troponins were greater than 27,000 EKG had diffuse ST elevation inferior laterally at admission and 24 hours later when STEMI was called ?Diagnostic cardiac cath showed severely depressed left ventricular function ejection fraction was between 25 and 30% with left ventricular enlargement ?Coronaries ?Left main large minor distal disease  ?LAD was large diffuse 50  to 75% proximal to mid, diffuse 75 to 95% mid to distal ?Circumflex is large ostial stent occluded thrombotic in-stent thrombosis from an old stent TIMI 0 flow ?RCA medium to small a completely occluded proximally CTO ?  ?Intervention ?Unsuccessful attempt at PCI and stent of in-stent thrombosis of old proximal stent to circumflex ?Unable to cross with a wire ?Patient developed heart failure type symptoms ?Treated with Lasix ?Groin was mynx ?Patient was transferred to ICU for aggressive medical therapy for heart failure and post MI care ? ?Coronary Diagrams ? ?Diagnostic ?Dominance: Right ?Intervention ? ? ?EKG:  EKG is *** ordered today.  The ekg ordered today demonstrates *** ? ?Recent Labs: ?02/19/2021: BNP 1,547.4 ?08/02/2021: ALT 33; BUN 30; Creatinine, Ser 1.29; Hemoglobin 9.6; Magnesium 2.4; Platelets 172; Potassium 3.6; Sodium 141; TSH 4.527  ?Recent Lipid Panel ?   ?Component Value Date/Time  ? CHOL 207 (H)  08/28/2020 1549  ? TRIG 61 08/28/2020 1549  ? HDL 75 08/28/2020 1549  ? CHOLHDL 2.8 08/28/2020 1549  ? VLDL 12 08/28/2020 1549  ? Muddy 120 (H) 08/28/2020 1549  ? LDLDIRECT 121.3 (H) 08/28/2020 1549  ? ? ? ?R

## 2021-08-02 NOTE — Progress Notes (Signed)
?PROGRESS NOTE ? ? ? ?Elizabeth Herring  [id number] DOB: 12-Jul-1955 DOA: 08/01/2021 ?PCP: Pcp, No  ? ? ?Chief Complaint  ?Patient presents with  ? Abdominal Pain  ? ? ?Brief Narrative:  ? ?Elizabeth Herring is a 66 y.o. female with medical history significant for coronary artery disease status post PCI with stent placement in 2014 followed by PCI on 07/15/2020, chronic systolic/diastolic heart failure, hyperlipidemia, who is admitted to [facility] on 08/01/2021 by way of transfer from [facility] emergency department with concern for acute upper gastrointestinal bleed after presenting from home to the latter facility complaining of generalized weakness.  ?-Work-up significant for hemoglobin of 6.3, with known baseline of 1210 days ago, she is Hemoccult positive, she was transfused 2 units PRBC and transferred to [facility] for GI evaluation. ? ?Assessment & Plan: ?  ?Principal Problem: ?  Acute upper gastrointestinal bleeding ?Active Problems: ?  Acute blood loss anemia ?  Generalized weakness ?  Chronic combined systolic and diastolic heart failure (HCC) ?  Hyperlipidemia ? ? ?Acute blood loss anemia/GI bleed ?-Presents with Hemoccult positive stool, significant drop in her hemoglobin. ?-Used units PRBC with good response, hemoglobin remains at 9.6 today, continue to monitor every 8 hours and transfuse as needed ?-Continue with Protonix drip for now. ?-Continue to hold aspirin and Plavix. ?-GI input greatly appreciated, plan for colonoscopy tomorrow. ? ?Chronic combined systolic and diastolic heart failure ?- along with chronic right ventricular systolic failure. ?-October 2020 with a EF of 20%. ,  Due to diastolic CHF. ?-Continue to hold Entresto, Aldactone and Farxiga till patient is more stable ?-Appears to be euvolemic, will hold home dose diuresis as she appears to be euvolemic, will use IV fluids judiciously. ? ?CAD ?-Continue to hold aspirin, Plavix due to GI bleed, continue to  hold Entresto till patient is more stable. ? ?Hyperlipidemia ?-Continue with statin  ? ?DVT prophylaxis: SCD's ?Code Status: Full ?Family Communication: None at bedside ?Disposition:  ? ?Status is: Observation ?The patient will require care spanning > 2 midnights and should be moved to inpatient because: Presents with GI bleed, acute blood loss anemia, requiring colonoscopy in a.m. ?  ?Consultants:  ?GI ? ? ? ? ?Subjective: ? ?She denies any chest pain, nausea or vomiting currently, reports for generalized weakness and fatigue. ? ?Objective: ?Vitals:  ? 08/02/21 0230 08/02/21 0245 08/02/21 0415 08/02/21 0550  ?BP: 137/85 134/89 136/87   ?Pulse: 91 90 91 93  ?Resp: 19 (!) 29 (!) 28   ?Temp:    98.3 ?F (36.8 ?C)  ?TempSrc:    Oral  ?SpO2: 95% 97% 97% 94%  ?Weight:    59.9 kg  ?Height:    5' (1.524 m)  ? ?No intake or output data in the 24 hours ending 08/02/21 1156 ?Filed Weights  ? 08/02/21 0550  ?Weight: 59.9 kg  ? ? ?Examination: ? ?General exam: Appears calm and comfortable  ?Respiratory system: Clear to auscultation. Respiratory effort normal. ?Cardiovascular system: S1 & S2 heard, RRR. No JVD, murmurs, rubs, gallops or clicks. No pedal edema. ?Gastrointestinal system: Abdomen is nondistended, soft and nontender. No organomegaly or masses felt. Normal bowel sounds heard. ?Central nervous system: Alert and oriented. No focal neurological deficits. ?Extremities: Symmetric 5 x 5 power. ?Skin: No rashes, lesions or ulcers ?Psychiatry: Judgement and insight appear normal. Mood & affect appropriate.  ? ? ? ?Data Reviewed: I have personally reviewed following labs and imaging studies ? ?CBC: ?Recent Labs  ?Lab 08/01/21 ?2237 08/02/21 ?6256  08/02/21 ?0830  ?WBC 3.5* 4.0  --   ?NEUTROABS 2.3 2.9  --   ?HGB 9.6* 9.8* 9.6*  ?HCT 28.9* 29.5* 29.1*  ?MCV 87.8 88.3  --   ?PLT 175 172  --   ? ? ?Basic Metabolic Panel: ?Recent Labs  ?Lab 08/01/21 ?2237 08/02/21 ?0351  ?NA 140 141  ?K 3.7 3.6  ?CL 100 103  ?CO2 29 26  ?GLUCOSE 93  88  ?BUN 30* 30*  ?CREATININE 1.34* 1.29*  ?CALCIUM 9.7 9.9  ?MG  --  2.4  ? ? ?GFR: ?Estimated Creatinine Clearance: 35.7 mL/min (A) (by C-G formula based on SCr of 1.29 mg/dL (H)). ? ?Liver Function Tests: ?Recent Labs  ?Lab 08/02/21 ?0351  ?AST 57*  ?ALT 33  ?ALKPHOS 125  ?BILITOT 3.1*  ?PROT 6.8  ?ALBUMIN 3.6  ? ? ?CBG: ?No results for input(s): GLUCAP in the last 168 hours. ? ? ?No results found for this or any previous visit (from the past 240 hour(s)).  ? ? ? ? ? ?Radiology Studies: ?No results found. ? ? ? ? ? ?Scheduled Meds: ? olopatadine  1 drop Both Eyes Q1200  ? [START ON 08/05/2021] pantoprazole  40 mg Intravenous Q12H  ? polyethylene glycol-electrolytes  4,000 mL Oral Once  ? ?Continuous Infusions: ? pantoprazole 8 mg/hr (08/02/21 1140)  ? ? ? LOS: 0 days  ? ? ? ? ? ? ?Huey Bienenstock, MD ?[facility] ? ? ?To contact the attending provider between 7A-7P or the covering provider during after hours 7P-7A, please log into the web site [email or web address] and access using universal [place] password for that web site. If you do not have the password, please call the hospital operator. ? ?08/02/2021, 11:56 AM  ?  ?

## 2021-08-02 NOTE — Assessment & Plan Note (Signed)
?  ?#)   Acute blood loss anemia: in the setting of suspected acute upper GI bleed, presenting Hgb noted to be 6.3, relative to baseline 11-12, with interval improvement in hemoglobin level to 9.6 following interval transfusion of 2 units.  BC, as further detailed above. At this time, patient appears hemodynamically stable and asymptomatic, as further described above.  Presenting CBC notable for elevated RDW, consistent with element acute blood loss anemia. ?  ?  ?Plan: work-up and management for presenting suspected acute upper GI bleed, as above, including close monitoring of Q4H H&H's, with clinical evaluation for determination of need for blood transfusion, as further described above. Monitor on telemetry. Monitor continuous pulse-ox. NPO. Refraining from pharmacologic DVT prophylaxis.  Holding home daily baby aspirin Plavix for now.  Check INR.  Add on the following to initial lab specimen collected in the ED today: total iron, TIBC, ferritin, MMA, folic acid level, reticulocyte count. Gastroenterology consulted, as above.  ?  ?

## 2021-08-02 NOTE — Assessment & Plan Note (Signed)
#)   Chronic combined systolic and diastolic heart failure: documented history of such, along with chronic right ventricular systolic failure, with most recent echocardiogram performed in October 2022 notable for LVEF less than 20%, moderately dilated left ventricle, grade 2 diastolic dysfunction, moderately reduced right ventricular systolic function as well as mild to moderate mitral regurgitation.  In the setting of chronic right ventricular systolic heart failure, there may be an element of associated preload dependence, which is pertinent in the context of her presenting acute upper gastrointestinal bleed.   ?  ?no clinical evidence to suggest acutely decompensated heart failure at this time.  Patient's home diuretic regimen reportedly consists of the following: Lasix 40 mg p.o. twice daily. Home cardiac medications also include the following: Entresto,. Etiology: Ischemic cardiomyopathy in the setting of a known history of coronary artery disease status post aforementioned PCI in March 2022, as further detailed above. ?  ?  ?Plan: monitor strict I's & O's and daily weights. Repeat BMP in the morning. Check serum magnesium level.  In setting of presenting acute upper gastrointestinal bleed associated with acute blood loss anemia, will hold home Lasix for now, will closely monitor ensuing volume status.  Additionally, holding this evening's Entresto. ?  ?  ?  ?  ?

## 2021-08-02 NOTE — Assessment & Plan Note (Signed)
?  ?#)   Generalized weakness: 3 to 4 days of generalized weakness in the absence of any acute focal neurologic deficits to suggest acute CVA.  Suspect contribution from presenting acute blood loss anemia in the setting of suspected acute upper gastrointestinal bleed, as above.  No evidence of overt underlying infection at this time, although we will check urinalysis to further evaluate. ?  ?Plan: Further evaluation management of acute blood loss anemia in the setting of suspected acute upper gastroscopy, as above, including serial hemoglobin trend, as above.  Check urinalysis.  Add on TSH.  Repeat CMP and CBC in the morning. ?  ?  ?  ?  ?  ?  ?  ?  ?

## 2021-08-03 ENCOUNTER — Encounter (HOSPITAL_COMMUNITY): Payer: Self-pay | Admitting: Internal Medicine

## 2021-08-03 ENCOUNTER — Other Ambulatory Visit: Payer: Self-pay

## 2021-08-03 ENCOUNTER — Observation Stay (HOSPITAL_COMMUNITY): Payer: Self-pay | Admitting: Certified Registered Nurse Anesthetist

## 2021-08-03 ENCOUNTER — Encounter (HOSPITAL_COMMUNITY)
Admission: EM | Disposition: A | Discharge status: Home / Self Care | Payer: Self-pay | Source: Other Acute Inpatient Hospital | Attending: Internal Medicine

## 2021-08-03 DIAGNOSIS — D509 Iron deficiency anemia, unspecified: Secondary | ICD-10-CM

## 2021-08-03 DIAGNOSIS — I252 Old myocardial infarction: Secondary | ICD-10-CM

## 2021-08-03 DIAGNOSIS — I2510 Atherosclerotic heart disease of native coronary artery without angina pectoris: Secondary | ICD-10-CM

## 2021-08-03 DIAGNOSIS — Z1211 Encounter for screening for malignant neoplasm of colon: Secondary | ICD-10-CM

## 2021-08-03 HISTORY — PX: ESOPHAGOGASTRODUODENOSCOPY (EGD) WITH PROPOFOL: SHX5813

## 2021-08-03 HISTORY — PX: GIVENS CAPSULE STUDY: SHX5432

## 2021-08-03 HISTORY — PX: COLONOSCOPY WITH PROPOFOL: SHX5780

## 2021-08-03 LAB — BASIC METABOLIC PANEL
Anion gap: 11 (ref 5–15)
BUN: 22 mg/dL (ref 8–23)
CO2: 27 mmol/L (ref 22–32)
Calcium: 9.7 mg/dL (ref 8.9–10.3)
Chloride: 101 mmol/L (ref 98–111)
Creatinine, Ser: 1.19 mg/dL — ABNORMAL HIGH (ref 0.44–1.00)
GFR, Estimated: 51 mL/min — ABNORMAL LOW (ref >60)
Glucose, Bld: 97 mg/dL (ref 70–99)
Potassium: 3.5 mmol/L (ref 3.5–5.1)
Sodium: 139 mmol/L (ref 135–145)

## 2021-08-03 LAB — CBC
HCT: 29.6 % — ABNORMAL LOW (ref 36.0–46.0)
Hemoglobin: 9.9 g/dL — ABNORMAL LOW (ref 12.0–15.0)
MCH: 29.5 pg (ref 26.0–34.0)
MCHC: 33.4 g/dL (ref 30.0–36.0)
MCV: 88.1 fL (ref 80.0–100.0)
Platelets: 162 10*3/uL (ref 150–400)
RBC: 3.36 MIL/uL — ABNORMAL LOW (ref 3.87–5.11)
RDW: 21.7 % — ABNORMAL HIGH (ref 11.5–15.5)
WBC: 3.6 10*3/uL — ABNORMAL LOW (ref 4.0–10.5)
nRBC: 0 % (ref 0.0–0.2)

## 2021-08-03 SURGERY — ESOPHAGOGASTRODUODENOSCOPY (EGD) WITH PROPOFOL
Anesthesia: Monitor Anesthesia Care

## 2021-08-03 MED ORDER — VASOPRESSIN 20 UNIT/ML IV SOLN
INTRAVENOUS | Status: DC | PRN
Start: 2021-08-03 — End: 2021-08-03
  Administered 2021-08-03: 1 [IU] via INTRAVENOUS
  Administered 2021-08-03 (×2): 2 [IU] via INTRAVENOUS
  Administered 2021-08-03: 1 [IU] via INTRAVENOUS
  Administered 2021-08-03: 2 [IU] via INTRAVENOUS

## 2021-08-03 MED ORDER — SODIUM CHLORIDE 0.9 % IV SOLN: INTRAVENOUS | Status: DC | PRN

## 2021-08-03 MED ORDER — LACTATED RINGERS IV SOLN: INTRAVENOUS | Status: DC

## 2021-08-03 MED ORDER — PROPOFOL 500 MG/50ML IV EMUL
INTRAVENOUS | Status: DC | PRN
Start: 2021-08-03 — End: 2021-08-03
  Administered 2021-08-03: 25 ug/kg/min via INTRAVENOUS

## 2021-08-03 MED ORDER — PROPOFOL 10 MG/ML IV BOLUS
INTRAVENOUS | Status: DC | PRN
  Administered 2021-08-03 (×2): 10 mg via INTRAVENOUS

## 2021-08-03 SURGICAL SUPPLY — 25 items

## 2021-08-03 NOTE — Progress Notes (Signed)
?PROGRESS NOTE ? ? ? ?Elizabeth Herring  [id number] DOB: 19-Dec-1955 DOA: 08/01/2021 ?PCP: Pcp, No  ? ? ?Chief Complaint  ?Patient presents with  ? Abdominal Pain  ? ? ?Brief Narrative:  ? ?Elizabeth Herring is a 65 y.o. female with medical history significant for coronary artery disease status post PCI with stent placement in 2014 followed by PCI on 07/15/2020, chronic systolic/diastolic heart failure, hyperlipidemia, who is admitted to [facility] on 08/01/2021 by way of transfer from [facility] emergency department with concern for acute upper gastrointestinal bleed after presenting from home to the latter facility complaining of generalized weakness.  ?-Work-up significant for hemoglobin of 6.3, with known baseline of 1210 days ago, she is Hemoccult positive, she was transfused 2 units PRBC and transferred to [facility] for GI evaluation. ?-Patient went for EGD/colonoscopy 3/27 with no acute findings, so capsule endoscopy has been ordered. ? ?Assessment & Plan: ?  ?Principal Problem: ?  Acute upper gastrointestinal bleeding ?Active Problems: ?  Acute lower GI bleeding ?  Acute blood loss anemia ?  Generalized weakness ?  Chronic combined systolic and diastolic heart failure (HCC) ?  Hyperlipidemia ? ? ?Acute blood loss anemia/GI bleed ?-Presents with Hemoccult positive stool, significant drop in her hemoglobin. ?-Baseline hemoglobin is around 12, noted to be 6.3 at [facility], she was transfused 2 units PRBC . ?-Globin remained stable since transfusion, continue to monitor closely . ?-GI input greatly appreciated, status post EGD/colonoscopy today, with no evidence of bleed, so GI has proceeded with video capsule endoscopy.  . ?-Continue to hold aspirin and Plavix. ? ?Chronic combined systolic and diastolic heart failure ?- along with chronic right ventricular systolic failure. ?-October 2020 with a EF of 20%. ,  Due to diastolic CHF. ?-Continue to hold Entresto,  Aldactone and Farxiga till patient is more stable ?-Appears to be euvolemic, will hold home dose diuresis as she appears to be euvolemic, will use IV fluids judiciously. ? ?CAD ?-Continue to hold aspirin, Plavix due to GI bleed, continue to hold Entresto till patient is more stable. ? ?Hyperlipidemia ?-Continue with statin  ? ?DVT prophylaxis: SCD's ?Code Status: Full ?Family Communication: None at bedside ?Disposition:  ? ?Status is: Observation ?The patient will require care spanning > 2 midnights and should be moved to inpatient because: Presents with GI bleed, acute blood loss anemia, [place] doing VCE. ?  ?Consultants:  ?GI ? ? ? ? ?Subjective: ? ?Significant events overnight as discussed with the patient, she denies any chest pain, shortness of breath, nausea or vomiting. ? ?Objective: ?Vitals:  ? 08/03/21 1140 08/03/21 1323 08/03/21 1345 08/03/21 1418  ?BP: 138/86  104/71 119/86  ?Pulse: 88   74  ?Resp: 15   16  ?Temp: 98.3 ?F (36.8 ?C) 97.9 ?F (36.6 ?C)  (!) 97.3 ?F (36.3 ?C)  ?TempSrc: Temporal   Oral  ?SpO2:    97%  ?Weight: 59.9 kg     ?Height: 5' (1.524 m)     ? ? ?Intake/Output Summary (Last 24 hours) at 08/03/2021 1430 ?Last data filed at 08/02/2021 1945 ?Gross per 24 hour  ?Intake 564.23 ml  ?Output 1000 ml  ?Net -435.77 ml  ? ?Filed Weights  ? 08/02/21 0550 08/03/21 0439 08/03/21 1140  ?Weight: 59.9 kg 59.9 kg 59.9 kg  ? ? ?Examination: ? ?Awake Alert, Oriented X 3, No new F.N deficits, Normal affect ?Symmetrical Chest wall movement, Good air movement bilaterally, CTAB ?RRR,No Gallops,Rubs or new Murmurs, No Parasternal Heave ?+ve B.Sounds,  Abd Soft, No tenderness, No rebound - guarding or rigidity. ?No Cyanosis, Clubbing or edema, No new Rash or bruise   ? ? ? ? ?Data Reviewed: I have personally reviewed following labs and imaging studies ? ?CBC: ?Recent Labs  ?Lab 08/01/21 ?2237 08/02/21 ?0351 08/02/21 ?0830 08/02/21 ?1427 08/02/21 ?2219 08/03/21 ?0400  ?WBC 3.5* 4.0  --   --   --  3.6*  ?NEUTROABS  2.3 2.9  --   --   --   --   ?HGB 9.6* 9.8* 9.6* 9.9* 10.3* 9.9*  ?HCT 28.9* 29.5* 29.1* 29.6* 32.9* 29.6*  ?MCV 87.8 88.3  --   --   --  88.1  ?PLT 175 172  --   --   --  162  ? ? ?Basic Metabolic Panel: ?Recent Labs  ?Lab 08/01/21 ?2237 08/02/21 ?0351 08/03/21 ?0400  ?NA 140 141 139  ?K 3.7 3.6 3.5  ?CL 100 103 101  ?CO2 29 26 27   ?GLUCOSE 93 88 97  ?BUN 30* 30* 22  ?CREATININE 1.34* 1.29* 1.19*  ?CALCIUM 9.7 9.9 9.7  ?MG  --  2.4  --   ? ? ?GFR: ?Estimated Creatinine Clearance: 38.7 mL/min (A) (by C-G formula based on SCr of 1.19 mg/dL (H)). ? ?Liver Function Tests: ?Recent Labs  ?Lab 08/02/21 ?0351  ?AST 57*  ?ALT 33  ?ALKPHOS 125  ?BILITOT 3.1*  ?PROT 6.8  ?ALBUMIN 3.6  ? ? ?CBG: ?No results for input(s): GLUCAP in the last 168 hours. ? ? ?No results found for this or any previous visit (from the past 240 hour(s)).  ? ? ? ? ? ?Radiology Studies: ?No results found. ? ? ? ? ? ?Scheduled Meds: ? atorvastatin  80 mg Oral QHS  ? montelukast  10 mg Oral QHS  ? olopatadine  1 drop Both Eyes Q1200  ? [START ON 08/05/2021] pantoprazole  40 mg Intravenous Q12H  ? ?Continuous Infusions: ? pantoprazole 8 mg/hr (08/02/21 1945)  ? ? ? LOS: 0 days  ? ? ? ? ? ? ?08/04/21, MD ?[facility] ? ? ?To contact the attending provider between 7A-7P or the covering provider during after hours 7P-7A, please log into the web site [email or web address] and access using universal [place] password for that web site. If you do not have the password, please call the hospital operator. ? ?08/03/2021, 2:30 PM  ?  ?

## 2021-08-03 NOTE — Anesthesia Procedure Notes (Signed)
Procedure Name: [place] ?Date/Time: 08/03/2021 12:45 PM ?Performed by: Harden Mo, CRNA ?Pre-anesthesia Checklist: Patient identified, Emergency Drugs available, Suction available and Patient being monitored ?Patient Re-evaluated:Patient Re-evaluated prior to induction ?Oxygen Delivery Method: Nasal cannula ?Preoxygenation: Pre-oxygenation with 100% oxygen ?Induction Type: IV induction ?Placement Confirmation: positive ETCO2 and breath sounds checked- equal and bilateral ?Dental Injury: Teeth and Oropharynx as per pre-operative assessment  ? ? ? ? ?

## 2021-08-03 NOTE — Anesthesia Procedure Notes (Addendum)
Arterial Line Insertion ?Start/End3/28/2023 11:50 AM, 08/03/2021 12:20 PM ?Performed by: Lewie Loron, MD ? Patient location: Pre-op. ?Preanesthetic checklist: patient identified, IV checked, site marked, risks and benefits discussed, surgical consent, monitors and equipment checked, pre-op evaluation, timeout performed and anesthesia consent ?Lidocaine 1% used for infiltration ?Right, radial was placed ?Catheter size: 20 G ?Hand hygiene performed  and maximum sterile barriers used  ? ?Attempts: 1 ?Procedure performed using ultrasound guided technique. ?Ultrasound Notes:anatomy identified, needle tip was noted to be adjacent to the nerve/plexus identified, no ultrasound evidence of intravascular and/or intraneural injection and image(s) printed for medical record ?Following insertion, dressing applied and Biopatch. ?Post procedure assessment: normal and unchanged ? ?Post procedure complications: second provider assisted. ?Patient tolerated the procedure well with no immediate complications. ? ? ? ?

## 2021-08-03 NOTE — Transfer of Care (Signed)
Immediate Anesthesia Transfer of Care Note ? ?Patient: Elizabeth Herring ? ?Procedure(s) Performed: ESOPHAGOGASTRODUODENOSCOPY (EGD) WITH PROPOFOL ?COLONOSCOPY WITH PROPOFOL ? ?Patient Location: PACU ? ?Anesthesia Type:MAC ? ?Level of Consciousness: awake, alert  and oriented ? ?Airway & Oxygen Therapy: Patient Spontanous Breathing and Patient connected to nasal cannula oxygen ? ?Post-op Assessment: Report given to RN, Post -op Vital signs reviewed and stable and Patient moving all extremities X 4 ? ?Post vital signs: Reviewed and stable ? ?Last Vitals:  ?Vitals Value Taken Time  ?BP 129/98 08/03/21 1323  ?Temp    ?Pulse 74 08/03/21 1325  ?Resp 20 08/03/21 1325  ?SpO2 91 % 08/03/21 1325  ?Vitals shown include unvalidated device data. ? ?Last Pain:  ?Vitals:  ? 08/03/21 1140  ?TempSrc: Temporal  ?PainSc: 0-No pain  ?   ? ?  ? ?Complications: No notable events documented. ?

## 2021-08-03 NOTE — Op Note (Addendum)
[facility] ?Patient Name: Elizabeth Herring ?Procedure Date : 08/03/2021 ?MRN: [id number] ?Attending MD: Juanita Craver , MD ?Date of Birth: 15-Apr-1956 ?CSN: [id number] ?Age: 66 ?Admit Type: Inpatient ?Procedure:                Diagnostic EGD. ?Indications:              Iron deficiency anemia, Heme positive stool. ?Providers:                Juanita Craver, MD, Doristine Johns, RN, Janie  ?                          Billups, Technician, Garrison Columbus, CRNA, Rob  ?                          Ola Spurr, MD ?Referring MD:             No PCP. ?Medicines:                Monitored Anesthesia Care ?Complications:            No immediate complications. ?Estimated Blood Loss:     Estimated blood loss: none. ?Procedure:                Pre-Anesthesia Assessment: - Prior to the  ?                          procedure, a history and physical was performed,  ?                          and patient medications and allergies were  ?                          reviewed. The patient's tolerance of previous  ?                          anesthesia was also reviewed. The risks and  ?                          benefits of the procedure and the sedation options  ?                          and risks were discussed with the patient. All  ?                          questions were answered, and informed consent was  ?                          obtained. Prior Anticoagulants: The patient has  ?                          taken Plavix 4 days befoore the procedure. ASA  ?                          Grade Assessment: IV - A patient with severe  ?  systemic disease that is a constant threat to life.  ?                          After reviewing the risks and benefits, the patient  ?                          was deemed in satisfactory condition to undergo the  ?                          procedure. After obtaining informed consent, the  ?                          endoscope was passed under direct vision.  ?                           Throughout the procedure, the patient's blood  ?                          pressure, pulse, and oxygen saturations were  ?                          monitored continuously. The [id number]-[id number] ([id number])  ?                          Olympus endoscope was introduced through the mouth,  ?                          and advanced to the second part of duodenum. The  ?                          EGD was accomplished without difficulty. The  ?                          patient tolerated the procedure well. ?Scope In: ?Scope Out: ?Findings: ?     The examined esophagus and the GEJ appeared widely patent and normal;  ?     SCJ was measured at 36 cm. ?     The entire examined stomach was normal. ?     The cardia and gastric fundus were normal on retroflexion. ?     The examined duodenum was normal. ?Impression:               - Normal appearing, widely patent esophagus and GEJ. ?                          - Normal appearing stomach. ?                          - Normal examined duodenum. ?                          - No specimens collected. ?Moderate Sedation: ?     MAC used. ?Recommendation:           - NPO for now till VCE is done. ?Procedure Code(s):        --- Professional --- ?  72550, Esophagogastroduodenoscopy, flexible,  ?                          transoral; diagnostic, including collection of  ?                          specimen(s) by brushing or washing, when performed  ?                          (separate procedure) ?Diagnosis Code(s):        --- Professional --- ?                          D50.9, Iron deficiency anemia, unspecified ?                          K92.1, Melena (includes Hematochezia) ?CPT copyright 2019 American [employer] Association. All rights reserved. ?The codes documented in this report are preliminary and upon coder review may  ?be revised to meet current compliance requirements. ?Juanita Craver, MD ?Juanita Craver, MD ?08/03/2021 1:22:49 PM ?This report has been signed electronically. ?Number of  Addenda: 0 ?

## 2021-08-03 NOTE — Anesthesia Postprocedure Evaluation (Signed)
Anesthesia Post Note ? ?Patient: Elizabeth Herring ? ?Procedure(s) Performed: ESOPHAGOGASTRODUODENOSCOPY (EGD) WITH PROPOFOL ?COLONOSCOPY WITH PROPOFOL ?GIVENS CAPSULE STUDY ? ?  ? ?Patient location during evaluation: PACU ?Anesthesia Type: MAC ?Level of consciousness: awake and alert ?Pain management: pain level controlled ?Vital Signs Assessment: post-procedure vital signs reviewed and stable ?Respiratory status: spontaneous breathing, nonlabored ventilation, respiratory function stable and patient connected to nasal cannula oxygen ?Cardiovascular status: stable and blood pressure returned to baseline ?Postop Assessment: no apparent nausea or vomiting ?Anesthetic complications: no ? ? ?No notable events documented. ? ?Last Vitals:  ?Vitals:  ? 08/03/21 1345 08/03/21 1418  ?BP: 104/71 119/86  ?Pulse:  74  ?Resp:  16  ?Temp:  (!) 36.3 ?C  ?SpO2:  97%  ?  ?Last Pain:  ?Vitals:  ? 08/03/21 1513  ?TempSrc:   ?PainSc: 0-No pain  ? ? ?  ?  ?  ?  ?  ?  ? ?Suzette Battiest E ? ? ? ? ?

## 2021-08-03 NOTE — Anesthesia Preprocedure Evaluation (Addendum)
Anesthesia Evaluation  ?Patient identified by MRN, date of birth, ID band ?Patient awake ? ? ? ?Reviewed: ?Allergy & Precautions, NPO status , Patient's Chart, lab work & pertinent test results ? ?Airway ?Mallampati: II ? ?TM Distance: >3 FB ?Neck ROM: Full ? ? ? Dental ? ?(+) Dental Advisory Given, Edentulous Upper, Partial Lower ?  ?Pulmonary ?pneumonia, Current Smoker and Patient abstained from smoking.,  ?  ?Pulmonary exam normal ?breath sounds clear to auscultation ? ? ? ? ? ? Cardiovascular ?hypertension, Pt. on medications ?+ CAD and + Past MI  ?Normal cardiovascular exam+ Valvular Problems/Murmurs MR  ?Rhythm:Regular Rate:Normal ? ?Echo 02/2021 ??1. Left ventricular ejection fraction, by estimation, is <20%. The left ventricle has severely decreased function. The left ventricle demonstrates global hypokinesis. The left ventricular internal cavity size was moderately dilated. Left ventricular diastolic parameters are consistent with Grade II diastolic dysfunction (pseudonormalization).  ??2. Right ventricular systolic function is moderately reduced. The right ventricular size is normal. There is mildly elevated pulmonary artery systolic pressure. The estimated right ventricular systolic pressure is 83.1 mmHg.  ??3. Left atrial size was mildly dilated.  ??4. Right atrial size was mildly dilated.  ??5. The mitral valve is normal in structure. Mild to moderate mitral valve regurgitation. Visually there appears to be moderate mitral stenosis.  ?Gradient not measured by tech.  ??6. Tricuspid valve regurgitation is severe.  ??7. The inferior vena cava is normal in size with <50% respiratory variability, suggesting right atrial pressure of 8 mmHg.  ? ? ?LHC 07/2020 ??Prox RCA lesion is 100% stenosed. CTO ??Mid LM lesion is 25% stenosed. ??Dist LAD lesion is 95% stenosed. ??Prox LAD to Dist LAD lesion is 75% stenosed. ??Dist LM to Prox LAD lesion is 50% stenosed. ??Prox Cx to Mid Cx  lesion is 100% stenosed. 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 ?  ?Neuro/Psych ?negative neurological ROS ?   ? GI/Hepatic ?negative GI ROS, Neg liver ROS,   ?Endo/Other  ?negative endocrine ROS ? Renal/GU ?negative Renal ROS  ? ?  ?Musculoskeletal ?negative musculoskeletal ROS ?(+)  ? Abdominal ?  ?Peds ? Hematology ? ?(+) Blood dyscrasia, anemia ,   ?Anesthesia Other Findings ? ? Reproductive/Obstetrics ? ?  ? ? ? ? ? ? ? ? ? ? ? ? ? ?  ?  ? ? ? ? ? ?Anesthesia Physical ?Anesthesia Plan ? ?ASA: 4 ? ?Anesthesia Plan: MAC  ? ?Post-op Pain Management: Minimal or no pain anticipated  ? ?Induction:  ? ?PONV Risk Score and Plan: 1 and Propofol infusion, Treatment may vary due to age or medical condition and Midazolam ? ?Airway Management Planned: Natural Airway ? ?Additional Equipment: Arterial line ? ?Intra-op Plan:  ? ?Post-operative Plan:  ? ?Informed Consent: I have reviewed the patients History and Physical, chart, labs and discussed the procedure including the risks, benefits and alternatives for the proposed anesthesia with the patient or authorized representative who has indicated his/her understanding and acceptance.  ? ? ? ?Dental advisory given ? ?Plan Discussed with: CRNA ? ?Anesthesia Plan Comments:   ? ? ? ? ? ?Anesthesia Quick Evaluation ? ?

## 2021-08-03 NOTE — Op Note (Signed)
[facility] ?Patient Name: Elizabeth Herring ?Procedure Date : 08/03/2021 ?MRN: [id number] ?Attending MD: Juanita Craver , MD ?Date of Birth: 01/01/1956 ?CSN: [id number] ?Age: 65 ?Admit Type: Inpatient ?Procedure:                Diagnostic colonoscopy ?Indications:              Iron deficiency anemia, Blood in stool ?Providers:                Juanita Craver, MD, Doristine Johns, RN, Narda Rutherford  ?                          Billups, Technician, Garrison Columbus, CRNA, Rob  ?                          Ola Spurr, MD ?Referring MD:             No PCP ?Medicines:                Monitored Anesthesia Care ?Complications:            No immediate complications. ?Estimated Blood Loss:     Estimated blood loss: none. ?Procedure:                Pre-Anesthesia Assessment: - Prior to the  ?                          procedure, a history and physical was performed,  ?                          and patient medications and allergies were  ?                          reviewed. The patient's tolerance of previous  ?                          anesthesia was also reviewed. The risks and  ?                          benefits of the procedure and the sedation options  ?                          and risks were discussed with the patient. All  ?                          questions were answered, and informed consent was  ?                          obtained. Prior Anticoagulants: The patient has  ?                          taken Plavix (clopidogrel), last dose was 4 days  ?                          prior to procedure. ASA Grade Assessment: IV - A  ?  patient with severe systemic disease that is a  ?                          constant threat to life. After reviewing the risks  ?                          and benefits, the patient was deemed in  ?                          satisfactory condition to undergo the procedure.  ?                          After obtaining informed consent, the colonoscope  ?                          was  passed under direct vision. Throughout the  ?                          procedure, the patient's blood pressure, pulse, and  ?                          oxygen saturations were monitored continuously. The  ?                          [id number]-[id number]) Olympus colonoscope was  ?                          introduced through the anus and advanced to the the  ?                          cecum, identified by appendiceal orifice and  ?                          ileocecal valve. The colonoscopy was performed  ?                          without difficulty. The patient tolerated the  ?                          procedure well. The quality of the bowel  ?                          preparation was good. The ileocecal valve, the  ?                          appendiceal orifice and the rectum were  ?                          photographed. The quality of the bowel preparation  ?                          was evaluated using the BBPS [facility] Bowel  ?  Preparation Scale) with scores of: Right Colon = 2  ?                          (minor amount of residual staining, small fragments  ?                          of stool and/or opaque liquid, but mucosa seen  ?                          well), Transverse Colon = 3 (entire mucosa seen  ?                          well with no residual staining, small fragments of  ?                          stool or opaque liquid) and Left Colon = 3 (entire  ?                          mucosa seen well with no residual staining, small  ?                          fragments of stool or opaque liquid). The total  ?                          BBPS score equals 8. The bowel preparation used was  ?                          NuLytely via single dose instruction. ?Scope In: 12:59:09 PM ?Scope Out: 1:13:34 PM ?Scope Withdrawal Time: 0 hours 7 minutes 57 seconds  ?Total Procedure Duration: 0 hours 14 minutes 25 seconds  ?Findings: ?     The entire examined colon appeared normal. ?     Retroflexion was  attempted but was not possible due to poor sphincter  ?     tone. ?Impression:               - The entire examined colon appeared normal. ?                          - No specimens collected. ?Recommendation:           - NPO today. ?                          - Continue present medications. ?                          - To visualize the small bowel, perform video  ?                          capsule endoscopy today. ?Procedure Code(s):        --- Professional --- ?                          [phone], Colonoscopy, flexible; diagnostic, including  ?  collection of specimen(s) by brushing or washing,  ?                          when performed (separate procedure) ?Diagnosis Code(s):        --- Professional --- ?                          D50.9, Iron deficiency anemia, unspecified ?                          K92.1, Melena (includes Hematochezia) ?                          Z12.11, Encounter for screening for malignant  ?                          neoplasm of colon ?CPT copyright 2019 American Medical Association. All rights reserved. ?The codes documented in this report are preliminary and upon coder review may  ?be revised to meet current compliance requirements. ?Juanita Craver, MD ?Juanita Craver, MD ?08/03/2021 1:30:06 PM ?This report has been signed electronically. ?Number of Addenda: 0 ?

## 2021-08-03 NOTE — Progress Notes (Signed)
Capsule endoscopy ordered by MD Collene Mares. Pt ingested Givens capsule at 1349. ? ?Per Givens capsule instructions, pt to remain NPO until 1549. Pt may then progress to clear liquids. Pt may have a small snack at 1749. Pt may resume ordered diet at 2149. ? ?The capsule endoscopy will end at [place] on 3/29 at which time the belt and recorder can be removed. Endo staff will pick up recorder and belt in the AM. ? ?Instructions given to pt. Pt verbalizes understanding. Physical copy of instructions given to pt. Instructions given to pt's inpt RN by secure chat message. ? ?If any questions arise, please feel free to call endoscopy at [phone] before 5 PM. After 5 PM please call hospital operator and ask for the endoscopy nurse on call. ? ?Debarah Crape, RN ?08/03/21 ?1:59 PM ? ? ?

## 2021-08-04 ENCOUNTER — Encounter (HOSPITAL_COMMUNITY): Payer: Self-pay | Admitting: Gastroenterology

## 2021-08-04 ENCOUNTER — Other Ambulatory Visit: Payer: Self-pay

## 2021-08-04 LAB — CBC
HCT: 26.6 % — ABNORMAL LOW (ref 36.0–46.0)
HCT: 28.2 % — ABNORMAL LOW (ref 36.0–46.0)
Hemoglobin: 8.9 g/dL — ABNORMAL LOW (ref 12.0–15.0)
Hemoglobin: 9.5 g/dL — ABNORMAL LOW (ref 12.0–15.0)
MCH: 29.4 pg (ref 26.0–34.0)
MCH: 30.1 pg (ref 26.0–34.0)
MCHC: 33.5 g/dL (ref 30.0–36.0)
MCHC: 33.7 g/dL (ref 30.0–36.0)
MCV: 87.8 fL (ref 80.0–100.0)
MCV: 89.2 fL (ref 80.0–100.0)
Platelets: 165 10*3/uL (ref 150–400)
Platelets: 174 10*3/uL (ref 150–400)
RBC: 3.03 MIL/uL — ABNORMAL LOW (ref 3.87–5.11)
RBC: 3.16 MIL/uL — ABNORMAL LOW (ref 3.87–5.11)
RDW: 21.5 % — ABNORMAL HIGH (ref 11.5–15.5)
RDW: 21.7 % — ABNORMAL HIGH (ref 11.5–15.5)
WBC: 4 10*3/uL (ref 4.0–10.5)
WBC: 4.3 10*3/uL (ref 4.0–10.5)
nRBC: 0 % (ref 0.0–0.2)
nRBC: 0 % (ref 0.0–0.2)

## 2021-08-04 LAB — BASIC METABOLIC PANEL
Anion gap: 10 (ref 5–15)
BUN: 20 mg/dL (ref 8–23)
CO2: 25 mmol/L (ref 22–32)
Calcium: 9.3 mg/dL (ref 8.9–10.3)
Chloride: 102 mmol/L (ref 98–111)
Creatinine, Ser: 1.21 mg/dL — ABNORMAL HIGH (ref 0.44–1.00)
GFR, Estimated: 50 mL/min — ABNORMAL LOW (ref >60)
Glucose, Bld: 113 mg/dL — ABNORMAL HIGH (ref 70–99)
Potassium: 3.3 mmol/L — ABNORMAL LOW (ref 3.5–5.1)
Sodium: 137 mmol/L (ref 135–145)

## 2021-08-04 LAB — METHYLMALONIC ACID, SERUM: Methylmalonic Acid, Quantitative: 276 nmol/L (ref 0–378)

## 2021-08-04 MED ORDER — CLOPIDOGREL BISULFATE 75 MG PO TABS
75.0000 mg | ORAL_TABLET | Freq: Every day | ORAL | 1 refills | Status: AC
  Filled 2021-08-04: qty 90, 90d supply, fill #0

## 2021-08-04 MED ORDER — POTASSIUM CHLORIDE CRYS ER 20 MEQ PO TBCR
40.0000 meq | EXTENDED_RELEASE_TABLET | Freq: Once | ORAL | Status: AC
Start: 2021-08-04 — End: 2021-08-04
  Administered 2021-08-04: 40 meq via ORAL
  Filled 2021-08-04: qty 2

## 2021-08-04 MED ORDER — ASPIRIN EC 81 MG PO TBEC: 81.0000 mg | DELAYED_RELEASE_TABLET | Freq: Every day | ORAL | 2 refills | Status: AC

## 2021-08-04 MED ORDER — PANTOPRAZOLE SODIUM 40 MG PO TBEC
40.0000 mg | DELAYED_RELEASE_TABLET | Freq: Every day | ORAL | 1 refills | Status: DC
  Filled 2021-08-04 – 2021-10-11 (×2): qty 30, 30d supply, fill #0

## 2021-08-04 NOTE — Plan of Care (Signed)
  Problem: Education: Goal: Knowledge of General Education information will improve Description: Including pain rating scale, medication(s)/side effects and non-pharmacologic comfort measures Outcome: Adequate for Discharge   Problem: Nutrition: Goal: Adequate nutrition will be maintained Outcome: Adequate for Discharge   Problem: Elimination: Goal: Will not experience complications related to bowel motility Outcome: Adequate for Discharge Goal: Will not experience complications related to urinary retention Outcome: Adequate for Discharge   Problem: Pain Managment: Goal: General experience of comfort will improve Outcome: Adequate for Discharge   

## 2021-08-04 NOTE — Progress Notes (Signed)
Mobility Specialist Progress Note  ? ? 08/04/21 1120  ?Mobility  ?Activity Ambulated independently in hallway  ?Level of Assistance Modified independent, requires aide device or extra time  ?Assistive Device  ?(IV pole)  ?Distance Ambulated (ft) 220 ft  ?Activity Response Tolerated well  ?$Mobility charge 1 Mobility  ? ?Pt received in chair and agreeable. No complaints. Returned to standing in room.  ? ?Hildred Alamin ?Mobility Specialist  ?  ?

## 2021-08-04 NOTE — Discharge Summary (Signed)
Physician Discharge Summary  ?[name] [id number] DOB: 1955-12-23 DOA: 08/01/2021 ? ?PCP: Pcp, No ? ?Admit date: 08/01/2021 ?Discharge date: 08/04/2021 ? ?Admitted From: Home ?Disposition: Home ? ?Recommendations for Outpatient Follow-up:  ?Follow up with PCP in 1-2 weeks ?Please obtain BMP/CBC in one week ?Continue to hold aspirin and Plavix for the next 7 days ? ?Home Health: ?Equipment/Devices: ? ?Discharge Condition: Stable ?CODE STATUS: Full code ?Diet recommendation: Heart healthy ? ?Brief/Interim Summary: ?Elizabeth Herring is a 66 y.o. female with medical history significant for coronary artery disease status post PCI with stent placement in 2014 followed by PCI on [id number], chronic systolic/diastolic heart failure, hyperlipidemia, who is admitted to [facility] on 08/01/2021 by way of transfer from [facility] emergency department with concern for acute upper gastrointestinal bleed after presenting from home to the latter facility complaining of generalized weakness.  ?-Work-up significant for hemoglobin of 6.3, with known baseline of 1210 days ago, she is Hemoccult positive, she was transfused 2 units PRBC and transferred to [facility] for GI evaluation. ?-Patient went for EGD/colonoscopy 3/27 with no acute findings ?-Capsule endoscopy was initiated, but per GI, capsule did not move all the way into the colon based on available images, no signs of bleeding noted. ? ?Discharge Diagnoses:  ?Principal Problem: ?  Acute upper gastrointestinal bleeding ?Active Problems: ?  Acute lower GI bleeding ?  Acute blood loss anemia ?  Generalized weakness ?  Chronic combined systolic and diastolic heart failure ([place]) ?  Hyperlipidemia ? ?Acute blood loss anemia/GI bleed ?-Presents with Hemoccult positive stool, significant drop in her hemoglobin. ?-Baseline hemoglobin is around 12, noted to be 6.3 at [facility], she was transfused 2 units PRBC . ?-Hemoglobin remained  stable since transfusion, continue to monitor closely . ?-GI input greatly appreciated, status post EGD/colonoscopy, with no evidence of bleed ?-Capsule endoscopy initiated, but capsule did not pass into colon ?-Per images reviewed, no signs of ongoing bleeding ?-Since patient is having bowel movements, no concern for obstruction ?-Overall hemoglobin has been stable making ongoing bleeding unlikely ?-Continue to hold aspirin and Plavix for another 7 days per GI. ?  ?Chronic combined systolic and diastolic heart failure ?- along with chronic right ventricular systolic failure. ?-October 2020 with a EF of 20%. ,  Due to diastolic CHF. ?-We will resume GDMT on discharge ?-Currently euvolemic ?  ?CAD ?-Continue to hold aspirin, Plavix due to GI bleed, continue to hold Entresto till patient is more stable. ?  ?Hyperlipidemia ?-Continue with statin  ? ?Discharge Instructions ? ?Discharge Instructions   ? ? Diet - low sodium heart healthy   Complete by: As directed ?  ? Increase activity slowly   Complete by: As directed ?  ? ?  ? ?Allergies as of 08/04/2021   ? ?   Reactions  ? Chlorhexidine   ? ?  ? ?  ?Medication List  ?  ? ?STOP taking these medications   ? ?cetirizine 10 MG tablet ?Commonly known as: ZYRTEC ?  ?spironolactone 25 MG tablet ?Commonly known as: ALDACTONE ?  ? ?  ? ?TAKE these medications   ? ?aspirin EC 81 MG tablet ?Take 1 tablet (81 mg total) by mouth daily. Swallow whole. Resume on 08/12/2021 ?What changed: additional instructions ?  ?atorvastatin 80 MG tablet ?Commonly known as: LIPITOR ?TAKE ONE TABLET BY MOUTH ONCE EVERY DAY. ?  ?clopidogrel 75 MG tablet ?Commonly known as: PLAVIX ?Take 1 tablet (75 mg total) by mouth once daily. Resume on  08/12/2021 ?  ?Entresto 24-26 MG ?Generic drug: sacubitril-valsartan ?Take 1 tablet by mouth 2 (two) times daily. ?  ?Farxiga 10 MG Tabs tablet ?Generic drug: dapagliflozin propanediol ?Take 1 tablet (10 mg total) by mouth once daily before breakfast. ?  ?furosemide  20 MG tablet ?Commonly known as: LASIX ?Take 2 tablets (40 mg total) by mouth 2 (two) times daily as directed. ?  ?montelukast 10 MG tablet ?Commonly known as: Singulair ?Take 1 tablet by mouth once daily. ?  ?Multi-Vitamin tablet ?Take 1 tablet by mouth daily. ?  ?Olopatadine HCl 0.2 % Soln ?Commonly known as: Pataday ?Instill 1 drop in both eyes once daily. ?  ?pantoprazole 40 MG tablet ?Commonly known as: Protonix ?Take 1 tablet (40 mg total) by mouth once daily. ?  ?potassium chloride 10 MEQ CR capsule ?Commonly known as: MICRO-K ?Take 10 mEq by mouth daily. ?  ? ?  ? ? Follow-up Information   ? ? Antonieta Iba, MD .   ?Specialty: Cardiology ?Contact information: ?[address] ?[address] ?[place] [address] ?[phone] ? ? ?  ?  ? ?  ?  ? ?  ? ?Allergies  ?Allergen Reactions  ? Chlorhexidine   ? ? ?Consultations: ?Gastroenterology ? ? ?Procedures/Studies: ?No results found. ? ? ? ?Subjective: ?She feels well.  Reports several brown stools today.  Denies any complaint.  Wants to go home. ? ?Discharge Exam: ?Vitals:  ? 08/03/21 1345 08/03/21 1418 08/03/21 2026 08/04/21 0448  ?BP: 104/71 119/86 112/85 110/70  ?Pulse:  74 86 88  ?Resp:  16 20 (!) 22  ?Temp:  (!) 97.3 ?F (36.3 ?C) 97.7 ?F (36.5 ?C) 97.7 ?F (36.5 ?C)  ?TempSrc:  Oral Oral Oral  ?SpO2:  97% 99%   ?Weight:    60.3 kg  ?Height:      ? ? ?General: Pt is alert, awake, not in acute distress ?Cardiovascular: RRR, S1/S2 +, no rubs, no gallops ?Respiratory: CTA bilaterally, no wheezing, no rhonchi ?Abdominal: Soft, NT, ND, bowel sounds + ?Extremities: no edema, no cyanosis ? ? ? ?The results of significant diagnostics from this hospitalization (including imaging, microbiology, ancillary and laboratory) are listed below for reference.   ? ? ?Microbiology: ?No results found for this or any previous visit (from the past 240 hour(s)).  ? ?Labs: ?BNP (last 3 results) ?Recent Labs  ?  02/19/21 ?1503  ?BNP 1,547.4*  ? ?Basic Metabolic Panel: ?Recent Labs   ?Lab 08/01/21 ?2237 08/02/21 ?0351 08/03/21 ?0400 08/04/21 ?0444  ?NA 140 141 139 137  ?K 3.7 3.6 3.5 3.3*  ?CL 100 103 101 102  ?CO2 29 26 27 25   ?GLUCOSE 93 88 97 113*  ?BUN 30* 30* 22 20  ?CREATININE 1.34* 1.29* 1.19* 1.21*  ?CALCIUM 9.7 9.9 9.7 9.3  ?MG  --  2.4  --   --   ? ?Liver Function Tests: ?Recent Labs  ?Lab 08/02/21 ?0351  ?AST 57*  ?ALT 33  ?ALKPHOS 125  ?BILITOT 3.1*  ?PROT 6.8  ?ALBUMIN 3.6  ? ?No results for input(s): LIPASE, AMYLASE in the last 168 hours. ?No results for input(s): AMMONIA in the last 168 hours. ?CBC: ?Recent Labs  ?Lab 08/01/21 ?2237 08/02/21 ?0351 08/02/21 ?0830 08/02/21 ?1427 08/02/21 ?2219 08/03/21 ?0400 08/04/21 ?0444 08/04/21 ?1256  ?WBC 3.5* 4.0  --   --   --  3.6* 4.0 4.3  ?NEUTROABS 2.3 2.9  --   --   --   --   --   --   ?  HGB 9.6* 9.8*   < > 9.9* 10.3* 9.9* 8.9* 9.5*  ?HCT 28.9* 29.5*   < > 29.6* 32.9* 29.6* 26.6* 28.2*  ?MCV 87.8 88.3  --   --   --  88.1 87.8 89.2  ?PLT 175 172  --   --   --  162 165 174  ? < > = values in this interval not displayed.  ? ?Cardiac Enzymes: ?No results for input(s): CKTOTAL, CKMB, CKMBINDEX, TROPONINI in the last 168 hours. ?BNP: ?Invalid input(s): POCBNP ?CBG: ?No results for input(s): GLUCAP in the last 168 hours. ?D-Dimer ?No results for input(s): DDIMER in the last 72 hours. ?Hgb A1c ?No results for input(s): HGBA1C in the last 72 hours. ?Lipid Profile ?No results for input(s): CHOL, HDL, LDLCALC, TRIG, CHOLHDL, LDLDIRECT in the last 72 hours. ?Thyroid function studies ?Recent Labs  ?  08/02/21 ?0351  ?TSH 4.527*  ? ?Anemia work up ?Recent Labs  ?  08/02/21 ?0351 08/02/21 ?0830  ?FOLATE  --  22.2  ?FERRITIN 21  --   ?TIBC 442  --   ?IRON 48  --   ?RETICCTPCT 1.9  --   ? ?Urinalysis ?No results found for: COLORURINE, APPEARANCEUR, [place], [place], [place], [place], [place], KETONESUR, PROTEINUR, [place], NITRITE, LEUKOCYTESUR ?Sepsis Labs ?Invalid input(s): PROCALCITONIN,  WBC,  LACTICIDVEN ?Microbiology ?No results found for  this or any previous visit (from the past 240 hour(s)). ? ? ?Time coordinating discharge: 97mins ? ?SIGNED: ? ? ?Kathie Dike, MD  ?[facility] ?08/04/2021, 8:30 PM ? ? ?If 7PM-7AM, please contac

## 2021-08-10 ENCOUNTER — Other Ambulatory Visit: Payer: Self-pay

## 2021-08-12 ENCOUNTER — Other Ambulatory Visit: Payer: Self-pay | Admitting: Cardiovascular Disease

## 2021-08-12 ENCOUNTER — Other Ambulatory Visit: Payer: Self-pay

## 2021-08-12 MED ORDER — POTASSIUM CHLORIDE ER 10 MEQ PO TBCR
10.0000 meq | EXTENDED_RELEASE_TABLET | Freq: Every day | ORAL | 3 refills | Status: DC
  Filled 2021-08-12 – 2021-10-11 (×2): qty 90, 90d supply, fill #0

## 2021-08-12 MED ORDER — FUROSEMIDE 20 MG PO TABS
40.0000 mg | ORAL_TABLET | Freq: Two times a day (BID) | ORAL | 3 refills | Status: DC
  Filled 2021-08-12 – 2021-10-11 (×2): qty 360, 90d supply, fill #0

## 2021-08-25 ENCOUNTER — Other Ambulatory Visit: Payer: Self-pay

## 2021-08-26 ENCOUNTER — Other Ambulatory Visit: Payer: Self-pay

## 2021-09-01 ENCOUNTER — Telehealth: Payer: Self-pay | Admitting: Pharmacist

## 2021-09-01 NOTE — Telephone Encounter (Signed)
09/01/2021 2:22:00 PM - Delene Loll refill call to [facility] ?-- Arletha Pili - Wednesday, September 01, 2021 2:19 PM --[employer]. Spoke to [employer] 24-26. ?

## 2021-09-02 ENCOUNTER — Other Ambulatory Visit: Payer: Self-pay

## 2021-09-06 ENCOUNTER — Other Ambulatory Visit: Payer: Self-pay

## 2021-09-07 ENCOUNTER — Telehealth: Payer: Self-pay | Admitting: Cardiovascular Disease

## 2021-09-07 ENCOUNTER — Encounter (HOSPITAL_BASED_OUTPATIENT_CLINIC_OR_DEPARTMENT_OTHER): Payer: Self-pay | Admitting: Family

## 2021-09-07 ENCOUNTER — Other Ambulatory Visit: Payer: Self-pay

## 2021-09-07 ENCOUNTER — Telehealth (HOSPITAL_BASED_OUTPATIENT_CLINIC_OR_DEPARTMENT_OTHER): Payer: Self-pay | Admitting: Family

## 2021-09-07 MED ORDER — ENTRESTO 24-26 MG PO TABS: 1.0000 | ORAL_TABLET | Freq: Two times a day (BID) | ORAL | 3 refills | Status: DC

## 2021-09-07 NOTE — Telephone Encounter (Signed)
Attempted to call - no ans / no vm

## 2021-09-07 NOTE — Telephone Encounter (Signed)
Received notice that patient assistance for Entresto through [employer] will and 10/21/2021.  Prescriber application as well as separate prescription per request have been faxed to [phone]. ? ?Letter and patient portion of application mailed to patient.  Instructed to bring to next office visit.  Message sent to scheduling team to help schedule her for overdue follow-up.   ? ?Additionally faxed blank copy of patient portion to [facility] [place] office and we will make a triage team aware that when patient portion received a fax to [phone] ? ?Alver Sorrow, NP  ?

## 2021-09-07 NOTE — Telephone Encounter (Signed)
-----   Message from Loel Dubonnet, NP sent at 09/07/2021  7:27 AM EDT ----- ?Please schedule patient for overdue  heart failure follow up with Dr. Rockey Situ or APP (missed last appt due to hospitalization). ? ?Thanks!  ?(Hope y'all are doing well!!) ?Loel Dubonnet, NP  ? ?

## 2021-09-08 ENCOUNTER — Other Ambulatory Visit: Payer: Self-pay

## 2021-09-09 ENCOUNTER — Other Ambulatory Visit: Payer: Self-pay

## 2021-09-10 ENCOUNTER — Other Ambulatory Visit: Payer: Self-pay

## 2021-09-13 NOTE — Telephone Encounter (Signed)
Scheduled

## 2021-09-15 ENCOUNTER — Other Ambulatory Visit: Payer: Self-pay

## 2021-09-16 ENCOUNTER — Telehealth: Payer: Self-pay | Admitting: Pharmacy Technician

## 2021-09-16 ENCOUNTER — Telehealth (HOSPITAL_BASED_OUTPATIENT_CLINIC_OR_DEPARTMENT_OTHER): Payer: Self-pay

## 2021-09-16 ENCOUNTER — Other Ambulatory Visit: Payer: Self-pay

## 2021-09-16 MED ORDER — ENTRESTO 24-26 MG PO TABS
1.0000 | ORAL_TABLET | Freq: Two times a day (BID) | ORAL | 3 refills | Status: DC
  Filled 2021-09-16 – 2021-09-28 (×3): qty 180, 90d supply, fill #0

## 2021-09-16 NOTE — Telephone Encounter (Addendum)
Medication refill sent ? ? ? ?----- Message from Ronney Asters, NP sent at 09/16/2021 12:40 PM EDT ----- ? ?Okay to refill Entresto.  Thank you. ? ?Thomasene Ripple. Cleaver NP-C ? ?09/16/2021, 12:41 PM ?[facility] ?[address] ?Office [phone] Fax [phone] ? ? ? ?----- Message ----- ?From: Sherilyn Dacosta ?Sent: 09/16/2021  12:26 PM EDT ?To: Ronney Asters, NP, Alver Sorrow, NP ? ?Please send an electronic prescription for Entresto 24-26MG  Tablet to [facility] for 90 day supply for above patient.  You may also fax a prescription to [phone]. ? ?Thank you, ? ?Willeen Niece ? ? ?

## 2021-09-16 NOTE — Telephone Encounter (Signed)
Patient has Medicare Part A, B & D asked for prescriptions to be transferred to [facility], [address] [place], [place], [place]  [address].  Pharmacy staff to take care of transfer. ? ?Sherilyn Dacosta ?Care Manager ?[facility] ?

## 2021-09-17 ENCOUNTER — Other Ambulatory Visit: Payer: Self-pay

## 2021-09-20 ENCOUNTER — Other Ambulatory Visit: Payer: Self-pay

## 2021-09-21 ENCOUNTER — Other Ambulatory Visit: Payer: Self-pay

## 2021-09-22 ENCOUNTER — Other Ambulatory Visit: Payer: Self-pay

## 2021-09-24 ENCOUNTER — Other Ambulatory Visit: Payer: Self-pay

## 2021-09-27 ENCOUNTER — Other Ambulatory Visit: Payer: Self-pay

## 2021-09-28 ENCOUNTER — Other Ambulatory Visit: Payer: Self-pay

## 2021-09-29 ENCOUNTER — Other Ambulatory Visit: Payer: Self-pay

## 2021-10-01 ENCOUNTER — Other Ambulatory Visit: Payer: Self-pay

## 2021-10-11 ENCOUNTER — Other Ambulatory Visit: Payer: Self-pay

## 2021-10-24 NOTE — Progress Notes (Deleted)
Cardiology Office Note  Date:  10/24/2021   ID:  Elizabeth Herring, DOB 06-Mar-1956, MRN [id number]  PCP:  Pcp, No   No chief complaint on file.   HPI:  Elizabeth Herring is a 66 y.o. female with PMH  smoker.   CAD  MI with PCI to her left circumflex in 2005.  2005 LHC showed 100% LCx, 70% LAD, 70% RCA, 0% left main disease.  2014 EF 20 to 25%, severe global hypokinesis of the LV, moderate to severe MR, moderate TR 07/14/20, code STEMI  LHC by Dr. Juliann Pares showed moderate to severely depressed LVSF (estimated 25-30%), RCA with CTO, mLM with 25%s, dLAD 95%s, pLAD-dLAD 75%s, dLM-pLAD 50%s, p-mLCx 100%s with in-stent thrombosis of the stent IRA TIMI 0 flow.  Moderate collaterals were noted from left to right and left to left.  Intervention with PCI was attempted but unsuccessful.  26 beat run of V. tach with K 2.9.  HS Tn > 27,000.   discharged with Zoll Life vest  Declined for ICD by EP secondary to medication noncompliance Who presents for follow-up of her ischemic cardiomyopathy, ejection fraction 20%  In follow-up today she brings her medications with her Taking Lipitor 80, aspirin, Plavix 75 daily, Lasix 20 daily with potassium 10 daily Entresto Biaxin only taking Entresto once a day  Reports having very high water intake Weight up 11 pounds in the past 4 months Has worsening leg swelling, cough, PND orthopnea  It appears that she does not have her metoprolol or Farxiga with her Not on spironolactone  EKG personally reviewed by myself on todays visit Normal sinus rhythm rate 96 bpm old anterior MI, consider old inferior MI   Cardiac catheterization Prox RCA lesion is 100% stenosed. CTO Mid LM lesion is 25% stenosed. Dist LAD lesion is 95% stenosed. Prox LAD to Dist LAD lesion is 75% stenosed. Dist LM to Prox LAD lesion is 50% stenosed. Prox Cx to Mid Cx lesion is 100% stenosed. In-stent thrombosis of the stent IRA TIMI 0 flow Moderate collaterals left to right left  to left Left ventricular function severely depressed left ventricular enlargement EF between 25 and 30% globally Unsuccessful attempted PCI and stent to ostial old stent to the circumflex with in-stent thrombosis failure to cross with a wire    STEMI presentation in house late recognition Troponins were greater than 27,000 EKG had diffuse ST elevation inferior laterally at admission and 24 hours later when STEMI was called Diagnostic cardiac cath showed severely depressed left ventricular function ejection fraction was between 25 and 30% with left ventricular enlargement Coronaries Left main large minor distal disease  LAD was large diffuse 50 to 75% proximal to mid, diffuse 75 to 95% mid to distal Circumflex is large ostial stent occluded thrombotic in-stent thrombosis from an old stent TIMI 0 flow RCA medium to small a completely occluded proximally CTO   Intervention Unsuccessful attempt at PCI and stent of in-stent thrombosis of old proximal stent to circumflex   PMH:   has a past medical history of Chronic systolic heart failure (HCC), Coronary artery disease, Hyperlipidemia, and Hypertension.  PSH:    Past Surgical History:  Procedure Laterality Date   COLONOSCOPY WITH PROPOFOL N/A 08/03/2021   Procedure: COLONOSCOPY WITH PROPOFOL;  Surgeon: Charna Elizabeth, MD;  Location: [facility] ENDOSCOPY;  Service: Gastroenterology;  Laterality: N/A;   CORONARY/GRAFT ACUTE MI REVASCULARIZATION N/A 07/15/2020   Procedure: Coronary/Graft Acute MI Revascularization;  Surgeon: Alwyn Pea, MD;  Location: [facility] INVASIVE CV LAB;  Service:  Cardiovascular;  Laterality: N/A;   ESOPHAGOGASTRODUODENOSCOPY (EGD) WITH PROPOFOL N/A 08/03/2021   Procedure: ESOPHAGOGASTRODUODENOSCOPY (EGD) WITH PROPOFOL;  Surgeon: Charna Elizabeth, MD;  Location: [facility] ENDOSCOPY;  Service: Gastroenterology;  Laterality: N/A;  Guaiac positive stools   GIVENS CAPSULE STUDY N/A 08/03/2021   Procedure: GIVENS CAPSULE STUDY;  Surgeon: Charna Elizabeth, MD;  Location: [facility] ENDOSCOPY;  Service: Gastroenterology;  Laterality: N/A;   LEFT HEART CATH AND CORONARY ANGIOGRAPHY N/A 07/15/2020   Procedure: LEFT HEART CATH AND CORONARY ANGIOGRAPHY;  Surgeon: Alwyn Pea, MD;  Location: [facility] INVASIVE CV LAB;  Service: Cardiovascular;  Laterality: N/A;    Current Outpatient Medications  Medication Sig Dispense Refill   aspirin EC 81 MG tablet Take 1 tablet (81 mg total) by mouth daily. Swallow whole. Resume on 08/12/2021 30 tablet 2   atorvastatin (LIPITOR) 80 MG tablet TAKE ONE TABLET BY MOUTH ONCE EVERY DAY. 90 tablet 1   clopidogrel (PLAVIX) 75 MG tablet Take 1 tablet (75 mg total) by mouth once daily. Resume on 08/12/2021 90 tablet 1   dapagliflozin propanediol (FARXIGA) 10 MG TABS tablet Take 1 tablet (10 mg total) by mouth once daily before breakfast. (Patient not taking: Reported on 08/01/2021) 90 tablet 3   furosemide (LASIX) 20 MG tablet Take 2 tablets (40 mg total) by mouth 2 (two) times daily as directed. 360 tablet 3   montelukast (SINGULAIR) 10 MG tablet Take 1 tablet by mouth once daily. 30 tablet 2   Multiple Vitamin (MULTI-VITAMIN) tablet Take 1 tablet by mouth daily.     Olopatadine HCl (PATADAY) 0.2 % SOLN Instill 1 drop in both eyes once daily. 5 mL 2   pantoprazole (PROTONIX) 40 MG tablet Take 1 tablet (40 mg total) by mouth once daily. 30 tablet 1   potassium chloride (KLOR-CON) 10 MEQ tablet Take 1 tablet (10 mEq total) by mouth once daily. 90 tablet 3   sacubitril-valsartan (ENTRESTO) 24-26 MG Take 1 tablet by mouth 2 (two) times daily. 180 tablet 3   No current facility-administered medications for this visit.     Allergies:   Chlorhexidine   Social History:  The patient  reports that she has been smoking cigarettes. She has been smoking an average of 1 pack per day. She has never used smokeless tobacco. She reports current alcohol use. She reports that she does not use drugs.   Family History:   family history is not  on file.    Review of Systems: Review of Systems  Constitutional: Negative.        Weight gain  HENT: Negative.    Respiratory:  Positive for cough and shortness of breath.   Cardiovascular:  Positive for leg swelling.  Gastrointestinal: Negative.   Musculoskeletal: Negative.   Neurological: Negative.   Psychiatric/Behavioral: Negative.    All other systems reviewed and are negative.    PHYSICAL EXAM: VS:  There were no vitals taken for this visit. , BMI There is no height or weight on file to calculate BMI. GEN: Well nourished, well developed, in no acute distress HEENT: normal Neck: no JVD, carotid bruits, or masses Cardiac: RRR; no murmurs, rubs, or gallops, 1-2+ pitting lower extremity edema to the thighs Respiratory:  clear to auscultation bilaterally, normal work of breathing GI: soft, nontender, nondistended, + BS MS: no deformity or atrophy Skin: warm and dry, no rash Neuro:  Strength and sensation are intact Psych: euthymic mood, full affect   Recent Labs: 02/19/2021: BNP 1,547.4 08/02/2021: ALT 33; Magnesium 2.4; TSH  4.527 08/04/2021: BUN 20; Creatinine, Ser 1.21; Hemoglobin 9.5; Platelets 174; Potassium 3.3; Sodium 137    Lipid Panel Lab Results  Component Value Date   CHOL 207 (H) 08/28/2020   HDL 75 08/28/2020   LDLCALC 120 (H) 08/28/2020   TRIG 61 08/28/2020      Wt Readings from Last 3 Encounters:  08/04/21 133 lb (60.3 kg)  07/02/21 151 lb (68.5 kg)  06/02/21 144 lb (65.3 kg)      ASSESSMENT AND PLAN:  Problem List Items Addressed This Visit   None Coronary artery disease with stable angina To medication noncompliance, is not taking spironolactone, metoprolol, Farxiga Recommend she continue the medication she is taking with change to diuretics as detailed below She is on aspirin Plavix Lipitor  Ischemic cardiomyopathy  Acute on chronic diastolic and systolic CHF noted on today's visit  weight up 11 pounds She has high water intake,  recommend she decreases drastically We will increase Lasix up to 40 twice daily with potassium 10 twice daily until weight down 10 pounds Then back to Lasix 40 daily potassium daily with extra 40 as needed for 3 pound weight gain Long discussion concerning CHF management, fluid restriction, salt restriction, medication compliance  Smoker  Complete cessation recommended Reports smoking 1 pack every week and a half    Bronchitis/COPD  Smoking cessation recommended   Total encounter time more than 40 minutes  Greater than 50% was spent in counseling and coordination of care with the patient    Signed, Dossie Arbour, M.D., Ph.D. [facility] [place], [place] [phone]

## 2021-10-25 ENCOUNTER — Ambulatory Visit: Payer: Self-pay | Admitting: Cardiovascular Disease

## 2021-10-25 DIAGNOSIS — I493 Ventricular premature depolarization: Secondary | ICD-10-CM

## 2021-10-25 DIAGNOSIS — I4729 Other ventricular tachycardia: Secondary | ICD-10-CM

## 2021-10-25 DIAGNOSIS — I255 Ischemic cardiomyopathy: Secondary | ICD-10-CM

## 2021-10-25 DIAGNOSIS — R000 Tachycardia, unspecified: Secondary | ICD-10-CM

## 2021-10-25 DIAGNOSIS — Z720 Tobacco use: Secondary | ICD-10-CM

## 2021-10-25 DIAGNOSIS — I5020 Unspecified systolic (congestive) heart failure: Secondary | ICD-10-CM

## 2021-11-05 ENCOUNTER — Other Ambulatory Visit: Payer: Self-pay

## 2021-11-22 ENCOUNTER — Other Ambulatory Visit: Payer: Self-pay

## 2022-01-14 ENCOUNTER — Telehealth: Payer: Self-pay | Admitting: Cardiovascular Disease

## 2022-01-14 MED ORDER — POTASSIUM CHLORIDE ER 10 MEQ PO TBCR: 10.0000 meq | EXTENDED_RELEASE_TABLET | Freq: Every day | ORAL | 0 refills | Status: AC

## 2022-01-14 MED ORDER — FUROSEMIDE 20 MG PO TABS: 40.0000 mg | ORAL_TABLET | Freq: Two times a day (BID) | ORAL | 0 refills | Status: DC

## 2022-01-14 NOTE — Telephone Encounter (Signed)
*  STAT* If patient is at the pharmacy, call can be transferred to refill team.   1. Which medications need to be refilled? (please list name of each medication and dose if known)  potassium chloride (KLOR-CON) 10 MEQ tablet dapagliflozin propanediol (FARXIGA) 10 MG TABS tablet furosemide (LASIX) 20 MG tablet  2. Which pharmacy/location (including street and city if local pharmacy) is medication to be sent to? [facility]  3. Do they need a 30 day or 90 day supply?  90 day supply

## 2022-01-16 NOTE — Progress Notes (Deleted)
Cardiology Office Note  Date:  01/16/2022   ID:  Elizabeth Herring, DOB Nov 30, 1955, MRN [id number]  PCP:  Pcp, No   No chief complaint on file.   HPI:  Elizabeth Herring is a 66 y.o. female with PMH  smoker.   CAD  MI with PCI to her left circumflex in 2005.  2005 LHC showed 100% LCx, 70% LAD, 70% RCA, 0% left main disease.  2014 EF 20 to 25%, severe global hypokinesis of the LV, moderate to severe MR, moderate TR 07/14/20, code STEMI  LHC by Dr. Clayborn Bigness showed moderate to severely depressed LVSF (estimated 25-30%), RCA with CTO, mLM with 25%s, dLAD 95%s, pLAD-dLAD 75%s, dLM-pLAD 50%s, p-mLCx 100%s with in-stent thrombosis of the stent IRA TIMI 0 flow.  Moderate collaterals were noted from left to right and left to left.  Intervention with PCI was attempted but unsuccessful.  26 beat run of V. tach with K 2.9.  HS Tn > 27,000.   discharged with Zoll Life vest  Declined for ICD by EP secondary to medication noncompliance Who presents for follow-up of her ischemic cardiomyopathy, ejection fraction 20%  Last seen by myself in clinic February 2023   In follow-up today she brings her medications with her Taking Lipitor 80, aspirin, Plavix 75 daily, Lasix 20 daily with potassium 10 daily Entresto Biaxin only taking Entresto once a day  Reports having very high water intake Weight up 11 pounds in the past 4 months Has worsening leg swelling, cough, PND orthopnea  It appears that she does not have her metoprolol or Farxiga with her Not on spironolactone  EKG personally reviewed by myself on todays visit Normal sinus rhythm rate 96 bpm old anterior MI, consider old inferior MI   Cardiac catheterization Prox RCA lesion is 100% stenosed. CTO Mid LM lesion is 25% stenosed. Dist LAD lesion is 95% stenosed. Prox LAD to Dist LAD lesion is 75% stenosed. Dist LM to Prox LAD lesion is 50% stenosed. Prox Cx to Mid Cx lesion is 100% stenosed. In-stent thrombosis of the stent IRA TIMI  0 flow Moderate collaterals left to right left to left Left ventricular function severely depressed left ventricular enlargement EF between 25 and 30% globally Unsuccessful attempted PCI and stent to ostial old stent to the circumflex with in-stent thrombosis failure to cross with a wire    STEMI presentation in house late recognition Troponins were greater than 27,000 EKG had diffuse ST elevation inferior laterally at admission and 24 hours later when STEMI was called Diagnostic cardiac cath showed severely depressed left ventricular function ejection fraction was between 25 and 30% with left ventricular enlargement Coronaries Left main large minor distal disease  LAD was large diffuse 50 to 75% proximal to mid, diffuse 75 to 95% mid to distal Circumflex is large ostial stent occluded thrombotic in-stent thrombosis from an old stent TIMI 0 flow RCA medium to small a completely occluded proximally CTO   Intervention Unsuccessful attempt at PCI and stent of in-stent thrombosis of old proximal stent to circumflex   PMH:   has a past medical history of Chronic systolic heart failure ([place]), Coronary artery disease, Hyperlipidemia, and Hypertension.  PSH:    Past Surgical History:  Procedure Laterality Date   COLONOSCOPY WITH PROPOFOL N/A 08/03/2021   Procedure: COLONOSCOPY WITH PROPOFOL;  Surgeon: Juanita Craver, MD;  Location: [place];  Service: Gastroenterology;  Laterality: N/A;   CORONARY/GRAFT ACUTE MI REVASCULARIZATION N/A 07/15/2020   Procedure: Coronary/Graft Acute MI Revascularization;  Surgeon: Lujean Amel  D, MD;  Location: [facility] INVASIVE CV [facility];  Service: Cardiovascular;  Laterality: N/A;   ESOPHAGOGASTRODUODENOSCOPY (EGD) WITH PROPOFOL N/A 08/03/2021   Procedure: ESOPHAGOGASTRODUODENOSCOPY (EGD) WITH PROPOFOL;  Surgeon: Charna Elizabeth, MD;  Location: [facility] ENDOSCOPY;  Service: Gastroenterology;  Laterality: N/A;  Guaiac positive stools   GIVENS CAPSULE STUDY N/A 08/03/2021    Procedure: GIVENS CAPSULE STUDY;  Surgeon: Charna Elizabeth, MD;  Location: [facility] ENDOSCOPY;  Service: Gastroenterology;  Laterality: N/A;   LEFT HEART CATH AND CORONARY ANGIOGRAPHY N/A 07/15/2020   Procedure: LEFT HEART CATH AND CORONARY ANGIOGRAPHY;  Surgeon: Alwyn Pea, MD;  Location: [facility] INVASIVE CV LAB;  Service: Cardiovascular;  Laterality: N/A;    Current Outpatient Medications  Medication Sig Dispense Refill   aspirin EC 81 MG tablet Take 1 tablet (81 mg total) by mouth daily. Swallow whole. Resume on 08/12/2021 30 tablet 2   atorvastatin (LIPITOR) 80 MG tablet TAKE ONE TABLET BY MOUTH ONCE EVERY DAY. 90 tablet 1   clopidogrel (PLAVIX) 75 MG tablet Take 1 tablet (75 mg total) by mouth once daily. Resume on 08/12/2021 90 tablet 1   dapagliflozin propanediol (FARXIGA) 10 MG TABS tablet Take 1 tablet (10 mg total) by mouth once daily before breakfast. (Patient not taking: Reported on 08/01/2021) 90 tablet 3   furosemide (LASIX) 20 MG tablet Take 2 tablets (40 mg total) by mouth 2 (two) times daily as directed. 360 tablet 0   montelukast (SINGULAIR) 10 MG tablet Take 1 tablet by mouth once daily. 30 tablet 2   Multiple Vitamin (MULTI-VITAMIN) tablet Take 1 tablet by mouth daily.     Olopatadine HCl (PATADAY) 0.2 % SOLN Instill 1 drop in both eyes once daily. 5 mL 2   pantoprazole (PROTONIX) 40 MG tablet Take 1 tablet (40 mg total) by mouth once daily. 30 tablet 1   potassium chloride (KLOR-CON) 10 MEQ tablet Take 1 tablet (10 mEq total) by mouth once daily. 90 tablet 0   sacubitril-valsartan (ENTRESTO) 24-26 MG Take 1 tablet by mouth 2 (two) times daily. 180 tablet 3   No current facility-administered medications for this visit.     Allergies:   Chlorhexidine   Social History:  The patient  reports that she has been smoking cigarettes. She has been smoking an average of 1 pack per day. She has never used smokeless tobacco. She reports current alcohol use. She reports that she does not use  drugs.   Family History:   family history is not on file.    Review of Systems: Review of Systems  Constitutional: Negative.        Weight gain  HENT: Negative.    Respiratory:  Positive for cough and shortness of breath.   Cardiovascular:  Positive for leg swelling.  Gastrointestinal: Negative.   Musculoskeletal: Negative.   Neurological: Negative.   Psychiatric/Behavioral: Negative.    All other systems reviewed and are negative.    PHYSICAL EXAM: VS:  There were no vitals taken for this visit. , BMI There is no height or weight on file to calculate BMI. GEN: Well nourished, well developed, in no acute distress HEENT: normal Neck: no JVD, carotid bruits, or masses Cardiac: RRR; no murmurs, rubs, or gallops, 1-2+ pitting lower extremity edema to the thighs Respiratory:  clear to auscultation bilaterally, normal work of breathing GI: soft, nontender, nondistended, + BS MS: no deformity or atrophy Skin: warm and dry, no rash Neuro:  Strength and sensation are intact Psych: euthymic mood, full affect   Recent  Labs: 02/19/2021: BNP 1,547.4 08/02/2021: ALT 33; Magnesium 2.4; TSH 4.527 08/04/2021: BUN 20; Creatinine, Ser 1.21; Hemoglobin 9.5; Platelets 174; Potassium 3.3; Sodium 137    Lipid Panel Lab Results  Component Value Date   CHOL 207 (H) 08/28/2020   HDL 75 08/28/2020   LDLCALC 120 (H) 08/28/2020   TRIG 61 08/28/2020      Wt Readings from Last 3 Encounters:  08/04/21 133 lb (60.3 kg)  07/02/21 151 lb (68.5 kg)  06/02/21 144 lb (65.3 kg)      ASSESSMENT AND PLAN:  Problem List Items Addressed This Visit   None Coronary artery disease with stable angina To medication noncompliance, is not taking spironolactone, metoprolol, Farxiga Recommend she continue the medication she is taking with change to diuretics as detailed below She is on aspirin Plavix Lipitor  Ischemic cardiomyopathy  Acute on chronic diastolic and systolic CHF noted on today's visit   weight up 11 pounds She has high water intake, recommend she decreases drastically We will increase Lasix up to 40 twice daily with potassium 10 twice daily until weight down 10 pounds Then back to Lasix 40 daily potassium daily with extra 40 as needed for 3 pound weight gain Long discussion concerning CHF management, fluid restriction, salt restriction, medication compliance  Smoker  Complete cessation recommended Reports smoking 1 pack every week and a half    Bronchitis/COPD  Smoking cessation recommended   Total encounter time more than 40 minutes  Greater than 50% was spent in counseling and coordination of care with the patient    Signed, Dossie Arbour, M.D., Ph.D. [facility] [place], [place] [phone]

## 2022-01-17 ENCOUNTER — Ambulatory Visit: Payer: Medicaid Other | Admitting: Cardiovascular Disease

## 2022-01-17 DIAGNOSIS — I5020 Unspecified systolic (congestive) heart failure: Secondary | ICD-10-CM

## 2022-01-17 DIAGNOSIS — I255 Ischemic cardiomyopathy: Secondary | ICD-10-CM

## 2022-01-17 DIAGNOSIS — I25118 Atherosclerotic heart disease of native coronary artery with other forms of angina pectoris: Secondary | ICD-10-CM

## 2022-01-17 DIAGNOSIS — I420 Dilated cardiomyopathy: Secondary | ICD-10-CM

## 2022-01-17 DIAGNOSIS — F17200 Nicotine dependence, unspecified, uncomplicated: Secondary | ICD-10-CM

## 2022-01-17 MED ORDER — DAPAGLIFLOZIN PROPANEDIOL 10 MG PO TABS: 10.0000 mg | ORAL_TABLET | Freq: Every day | ORAL | 0 refills | Status: AC

## 2022-01-17 NOTE — Addendum Note (Signed)
Addended by: Auburn Bilberry D on: 01/17/2022 02:04 PM   Modules accepted: Orders

## 2022-01-24 NOTE — Progress Notes (Deleted)
Cardiology Office Note  Date:  01/24/2022   ID:  Elizabeth Herring, DOB [birth year], MRN [id number]  PCP:  Pcp, No   No chief complaint on file.   HPI:  Elizabeth Herring is a 66 y.o. female with PMH  smoker.   CAD  MI with PCI to her left circumflex in 2005.  2005 LHC showed 100% LCx, 70% LAD, 70% RCA, 0% left main disease.  2014 EF 20 to 25%, severe global hypokinesis of the LV, moderate to severe MR, moderate TR 07/14/20, code STEMI  LHC by Dr. Clayborn Bigness showed moderate to severely depressed LVSF (estimated 25-30%), RCA with CTO, mLM with 25%s, dLAD 95%s, pLAD-dLAD 75%s, dLM-pLAD 50%s, p-mLCx 100%s with in-stent thrombosis of the stent IRA TIMI 0 flow.  Moderate collaterals were noted from left to right and left to left.  Intervention with PCI was attempted but unsuccessful.  26 beat run of V. tach with K 2.9.  HS Tn > 27,000.   discharged with Zoll Life vest  Declined for ICD by EP secondary to medication noncompliance Who presents for follow-up of her ischemic cardiomyopathy, ejection fraction 20%  Last seen by myself in clinic February 2023   In follow-up today she brings her medications with her Taking Lipitor 80, aspirin, Plavix 75 daily, Lasix 20 daily with potassium 10 daily Entresto Biaxin only taking Entresto once a day  Reports having very high water intake Weight up 11 pounds in the past 4 months Has worsening leg swelling, cough, PND orthopnea  It appears that she does not have her metoprolol or Farxiga with her Not on spironolactone  EKG personally reviewed by myself on todays visit Normal sinus rhythm rate 96 bpm old anterior MI, consider old inferior MI   Cardiac catheterization Prox RCA lesion is 100% stenosed. CTO Mid LM lesion is 25% stenosed. Dist LAD lesion is 95% stenosed. Prox LAD to Dist LAD lesion is 75% stenosed. Dist LM to Prox LAD lesion is 50% stenosed. Prox Cx to Mid Cx lesion is 100% stenosed. In-stent thrombosis of the stent IRA TIMI  0 flow Moderate collaterals left to right left to left Left ventricular function severely depressed left ventricular enlargement EF between 25 and 30% globally Unsuccessful attempted PCI and stent to ostial old stent to the circumflex with in-stent thrombosis failure to cross with a wire    STEMI presentation in house late recognition Troponins were greater than 27,000 EKG had diffuse ST elevation inferior laterally at admission and 24 hours later when STEMI was called Diagnostic cardiac cath showed severely depressed left ventricular function ejection fraction was between 25 and 30% with left ventricular enlargement Coronaries Left main large minor distal disease  LAD was large diffuse 50 to 75% proximal to mid, diffuse 75 to 95% mid to distal Circumflex is large ostial stent occluded thrombotic in-stent thrombosis from an old stent TIMI 0 flow RCA medium to small a completely occluded proximally CTO   Intervention Unsuccessful attempt at PCI and stent of in-stent thrombosis of old proximal stent to circumflex   PMH:   has a past medical history of Chronic systolic heart failure ([place]), Coronary artery disease, Hyperlipidemia, and Hypertension.  PSH:    Past Surgical History:  Procedure Laterality Date   COLONOSCOPY WITH PROPOFOL N/A 08/03/2021   Procedure: COLONOSCOPY WITH PROPOFOL;  Surgeon: Juanita Craver, MD;  Location: [place];  Service: Gastroenterology;  Laterality: N/A;   CORONARY/GRAFT ACUTE MI REVASCULARIZATION N/A 07/15/2020   Procedure: Coronary/Graft Acute MI Revascularization;  Surgeon: Lujean Amel  D, MD;  Location: [facility] INVASIVE CV LAB;  Service: Cardiovascular;  Laterality: N/A;   ESOPHAGOGASTRODUODENOSCOPY (EGD) WITH PROPOFOL N/A 08/03/2021   Procedure: ESOPHAGOGASTRODUODENOSCOPY (EGD) WITH PROPOFOL;  Surgeon: Charna Elizabeth, MD;  Location: [facility] ENDOSCOPY;  Service: Gastroenterology;  Laterality: N/A;  Guaiac positive stools   GIVENS CAPSULE STUDY N/A 08/03/2021    Procedure: GIVENS CAPSULE STUDY;  Surgeon: Charna Elizabeth, MD;  Location: [facility] ENDOSCOPY;  Service: Gastroenterology;  Laterality: N/A;   LEFT HEART CATH AND CORONARY ANGIOGRAPHY N/A 07/15/2020   Procedure: LEFT HEART CATH AND CORONARY ANGIOGRAPHY;  Surgeon: Alwyn Pea, MD;  Location: [facility] INVASIVE CV LAB;  Service: Cardiovascular;  Laterality: N/A;    Current Outpatient Medications  Medication Sig Dispense Refill   aspirin EC 81 MG tablet Take 1 tablet (81 mg total) by mouth daily. Swallow whole. Resume on 08/12/2021 30 tablet 2   atorvastatin (LIPITOR) 80 MG tablet TAKE ONE TABLET BY MOUTH ONCE EVERY DAY. 90 tablet 1   clopidogrel (PLAVIX) 75 MG tablet Take 1 tablet (75 mg total) by mouth once daily. Resume on 08/12/2021 90 tablet 1   dapagliflozin propanediol (FARXIGA) 10 MG TABS tablet Take 1 tablet (10 mg total) by mouth daily with breakfast. Please keep appointment for further refills 30 tablet 0   furosemide (LASIX) 20 MG tablet Take 2 tablets (40 mg total) by mouth 2 (two) times daily as directed. 360 tablet 0   montelukast (SINGULAIR) 10 MG tablet Take 1 tablet by mouth once daily. 30 tablet 2   Multiple Vitamin (MULTI-VITAMIN) tablet Take 1 tablet by mouth daily.     Olopatadine HCl (PATADAY) 0.2 % SOLN Instill 1 drop in both eyes once daily. 5 mL 2   pantoprazole (PROTONIX) 40 MG tablet Take 1 tablet (40 mg total) by mouth once daily. 30 tablet 1   potassium chloride (KLOR-CON) 10 MEQ tablet Take 1 tablet (10 mEq total) by mouth once daily. 90 tablet 0   sacubitril-valsartan (ENTRESTO) 24-26 MG Take 1 tablet by mouth 2 (two) times daily. 180 tablet 3   No current facility-administered medications for this visit.     Allergies:   Chlorhexidine   Social History:  The patient  reports that she has been smoking cigarettes. She has been smoking an average of 1 pack per day. She has never used smokeless tobacco. She reports current alcohol use. She reports that she does not use drugs.    Family History:   family history is not on file.    Review of Systems: Review of Systems  Constitutional: Negative.        Weight gain  HENT: Negative.    Respiratory:  Positive for cough and shortness of breath.   Cardiovascular:  Positive for leg swelling.  Gastrointestinal: Negative.   Musculoskeletal: Negative.   Neurological: Negative.   Psychiatric/Behavioral: Negative.    All other systems reviewed and are negative.    PHYSICAL EXAM: VS:  There were no vitals taken for this visit. , BMI There is no height or weight on file to calculate BMI. GEN: Well nourished, well developed, in no acute distress HEENT: normal Neck: no JVD, carotid bruits, or masses Cardiac: RRR; no murmurs, rubs, or gallops, 1-2+ pitting lower extremity edema to the thighs Respiratory:  clear to auscultation bilaterally, normal work of breathing GI: soft, nontender, nondistended, + BS MS: no deformity or atrophy Skin: warm and dry, no rash Neuro:  Strength and sensation are intact Psych: euthymic mood, full affect   Recent Labs:  02/19/2021: BNP 1,547.4 08/02/2021: ALT 33; Magnesium 2.4; TSH 4.527 08/04/2021: BUN 20; Creatinine, Ser 1.21; Hemoglobin 9.5; Platelets 174; Potassium 3.3; Sodium 137    Lipid Panel Lab Results  Component Value Date   CHOL 207 (H) 08/28/2020   HDL 75 08/28/2020   LDLCALC 120 (H) 08/28/2020   TRIG 61 08/28/2020      Wt Readings from Last 3 Encounters:  08/04/21 133 lb (60.3 kg)  07/02/21 151 lb (68.5 kg)  06/02/21 144 lb (65.3 kg)      ASSESSMENT AND PLAN:  Problem List Items Addressed This Visit       Cardiology Problems   Hyperlipidemia   Ischemic cardiomyopathy   Essential hypertension   Other Visit Diagnoses     HFrEF (heart failure with reduced ejection fraction) (HCC)    -  Primary   Current tobacco use       NSVT (nonsustained ventricular tachycardia) (HCC)       Sinus tachycardia          Coronary artery disease with stable  angina To medication noncompliance, is not taking spironolactone, metoprolol, Farxiga Recommend she continue the medication she is taking with change to diuretics as detailed below She is on aspirin Plavix Lipitor  Ischemic cardiomyopathy  Acute on chronic diastolic and systolic CHF noted on today's visit  weight up 11 pounds She has high water intake, recommend she decreases drastically We will increase Lasix up to 40 twice daily with potassium 10 twice daily until weight down 10 pounds Then back to Lasix 40 daily potassium daily with extra 40 as needed for 3 pound weight gain Long discussion concerning CHF management, fluid restriction, salt restriction, medication compliance  Smoker  Complete cessation recommended Reports smoking 1 pack every week and a half    Bronchitis/COPD  Smoking cessation recommended   Total encounter time more than 40 minutes  Greater than 50% was spent in counseling and coordination of care with the patient    Signed, Dossie Arbour, M.D., Ph.D. [facility] [place], [place] [phone]

## 2022-01-25 ENCOUNTER — Ambulatory Visit: Payer: Medicaid Other | Admitting: Cardiovascular Disease

## 2022-02-07 IMAGING — CR DG CHEST 2V
3 series · 3 of 3 positions shown · non-contrast
Comparison: None.

CLINICAL DATA: Cough, shortness of breath

EXAM:
CHEST - 2 VIEW

[chest pa (1 of 2)]
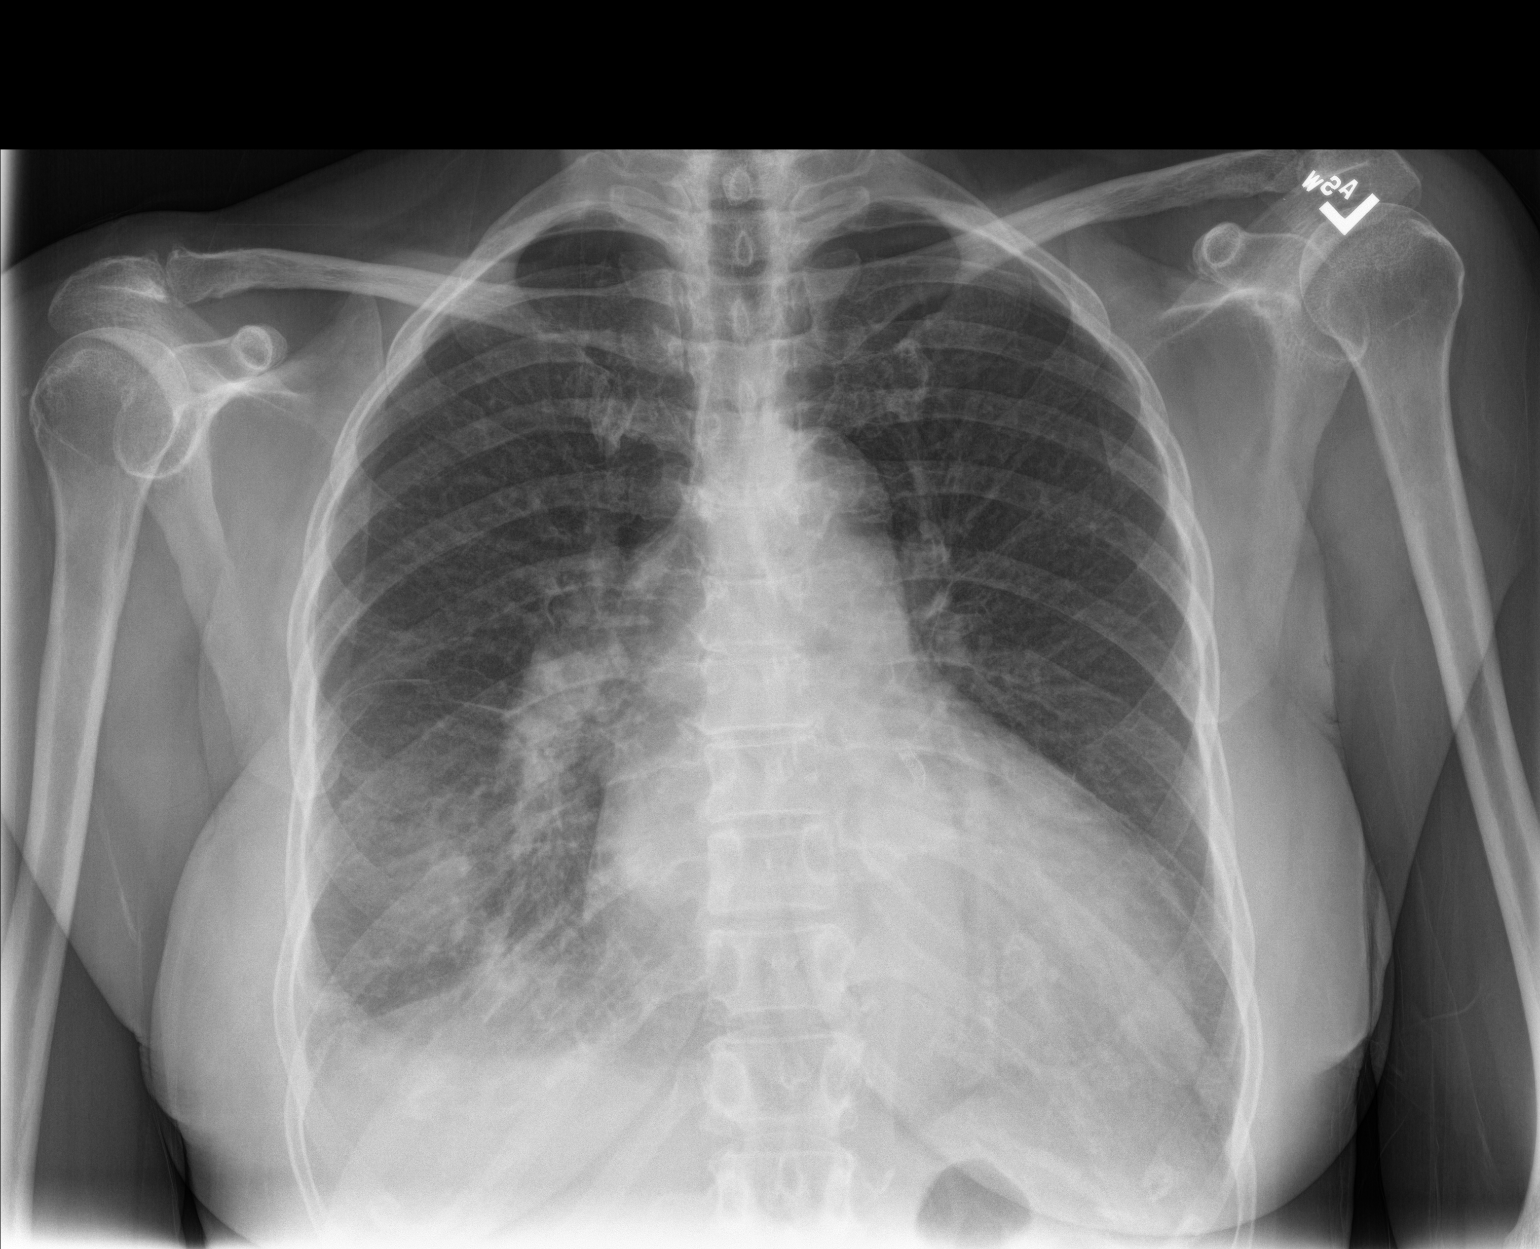

[chest lat]
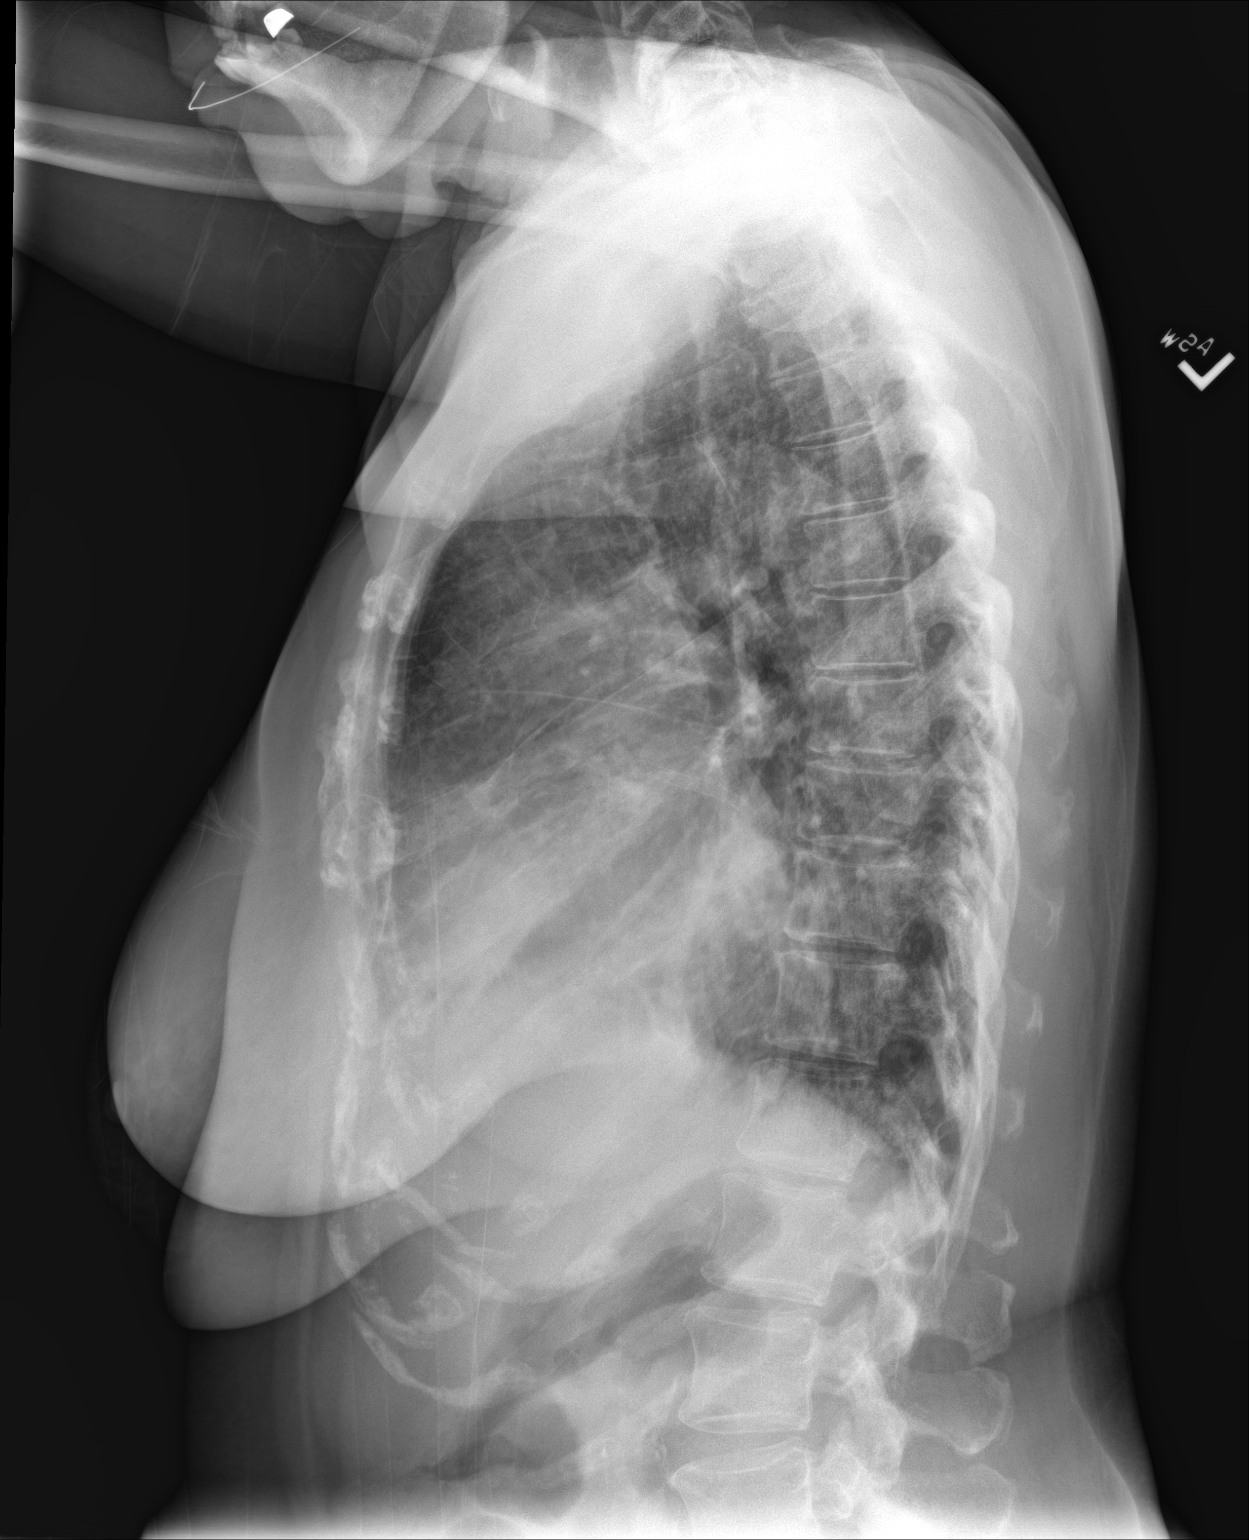

[chest pa (2 of 2)]
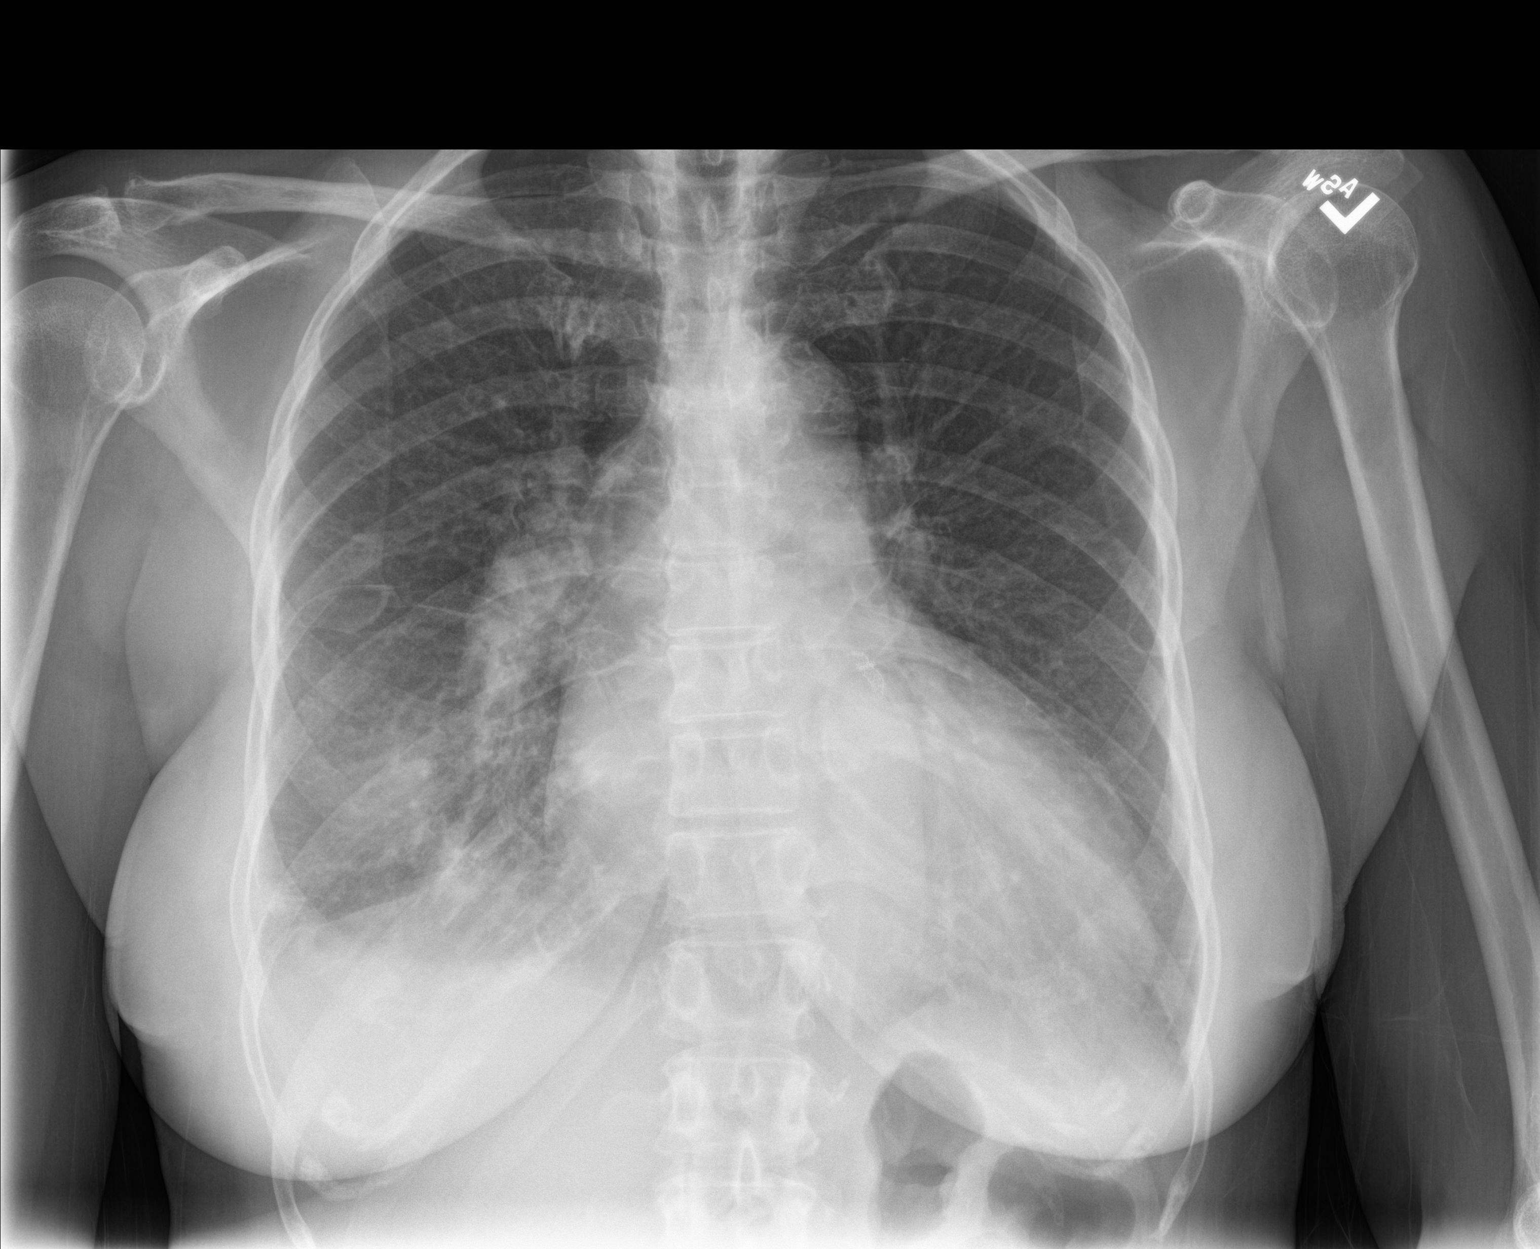

[3 of 3 positions shown; findings below may reference images not displayed]

FINDINGS: Patchy right lower lobe opacity, suspicious for pneumonia. Suspected
small bilateral pleural effusions. No frank interstitial edema.

The heart is top-normal in size.

Visualized osseous structures are within normal limits.
IMPRESSION: Patchy right lower lobe opacity, suspicious for pneumonia.

Suspected small bilateral pleural effusions.

## 2022-02-08 IMAGING — DX DG CHEST 1V PORT
1 series · 1 of 1 positions shown · non-contrast
Comparison: 07/14/2020.

CLINICAL DATA: Pneumonia.  Shortness of breath.  Cough.  Fever.

EXAM:
PORTABLE CHEST 1 VIEW

[chest ap]
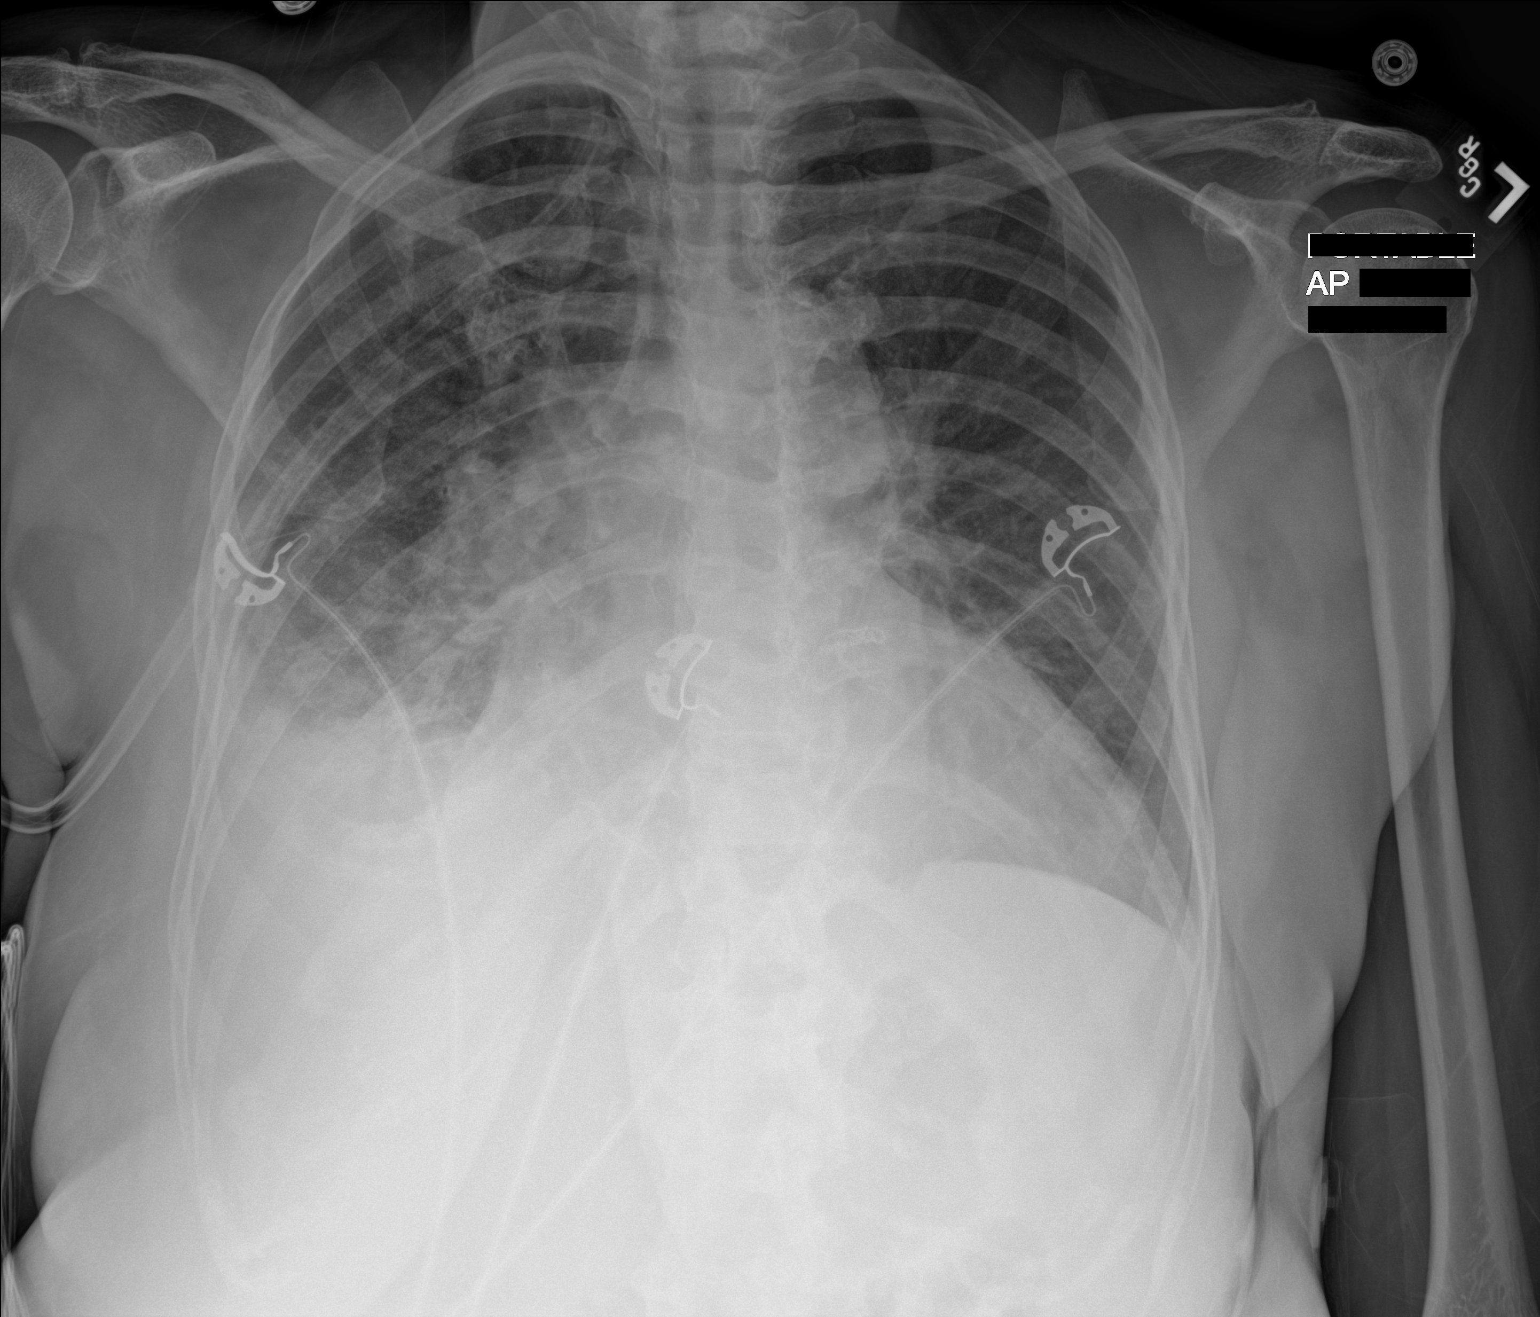

[1 of 1 positions shown; findings below may reference images not displayed]

FINDINGS: Cardiomegaly with and pulmonary venous congestion. Right base
infiltrate most consistent pneumonia. Asymmetric edema could also
present this fashion. Small moderate right-sided pleural effusion.
No pneumothorax.
IMPRESSION: 1. Cardiomegaly with pulmonary venous congestion.
2. Right base infiltrate most consistent with pneumonia. Asymmetric
pulmonary edema could also present this fashion. Small moderate
right pleural effusion.

## 2022-02-17 ENCOUNTER — Other Ambulatory Visit: Payer: Self-pay | Admitting: Primary Podiatric Medicine

## 2022-02-17 DIAGNOSIS — M7662 Achilles tendinitis, left leg: Secondary | ICD-10-CM

## 2022-03-15 ENCOUNTER — Other Ambulatory Visit: Payer: Self-pay

## 2022-03-15 ENCOUNTER — Emergency Department (HOSPITAL_COMMUNITY): Payer: Medicare HMO

## 2022-03-15 ENCOUNTER — Encounter (HOSPITAL_COMMUNITY): Payer: Self-pay

## 2022-03-15 ENCOUNTER — Inpatient Hospital Stay (HOSPITAL_COMMUNITY)
Admission: EM | Admit: 2022-03-15 | Discharge: 2022-03-21 | DRG: 291 | Disposition: A | Discharge status: Skilled Nursing Facility | Payer: Medicare HMO | Attending: Family Medicine | Admitting: Family Medicine

## 2022-03-15 DIAGNOSIS — R0902 Hypoxemia: Secondary | ICD-10-CM | POA: Diagnosis present

## 2022-03-15 DIAGNOSIS — R570 Cardiogenic shock: Secondary | ICD-10-CM | POA: Diagnosis not present

## 2022-03-15 DIAGNOSIS — Z955 Presence of coronary angioplasty implant and graft: Secondary | ICD-10-CM | POA: Diagnosis not present

## 2022-03-15 DIAGNOSIS — Z79899 Other long term (current) drug therapy: Secondary | ICD-10-CM

## 2022-03-15 DIAGNOSIS — Z91148 Patient's other noncompliance with medication regimen for other reason: Secondary | ICD-10-CM | POA: Diagnosis not present

## 2022-03-15 DIAGNOSIS — I509 Heart failure, unspecified: Secondary | ICD-10-CM | POA: Diagnosis not present

## 2022-03-15 DIAGNOSIS — I5023 Acute on chronic systolic (congestive) heart failure: Secondary | ICD-10-CM | POA: Diagnosis not present

## 2022-03-15 DIAGNOSIS — I9589 Other hypotension: Secondary | ICD-10-CM | POA: Diagnosis not present

## 2022-03-15 DIAGNOSIS — N1832 Chronic kidney disease, stage 3b: Secondary | ICD-10-CM | POA: Diagnosis present

## 2022-03-15 DIAGNOSIS — F17210 Nicotine dependence, cigarettes, uncomplicated: Secondary | ICD-10-CM | POA: Diagnosis present

## 2022-03-15 DIAGNOSIS — L89312 Pressure ulcer of right buttock, stage 2: Secondary | ICD-10-CM | POA: Diagnosis present

## 2022-03-15 DIAGNOSIS — R609 Edema, unspecified: Secondary | ICD-10-CM | POA: Diagnosis not present

## 2022-03-15 DIAGNOSIS — I2489 Other forms of acute ischemic heart disease: Secondary | ICD-10-CM | POA: Diagnosis present

## 2022-03-15 DIAGNOSIS — I5084 End stage heart failure: Secondary | ICD-10-CM | POA: Diagnosis present

## 2022-03-15 DIAGNOSIS — E871 Hypo-osmolality and hyponatremia: Secondary | ICD-10-CM | POA: Diagnosis present

## 2022-03-15 DIAGNOSIS — I130 Hypertensive heart and chronic kidney disease with heart failure and stage 1 through stage 4 chronic kidney disease, or unspecified chronic kidney disease: Secondary | ICD-10-CM | POA: Diagnosis present

## 2022-03-15 DIAGNOSIS — R17 Unspecified jaundice: Secondary | ICD-10-CM | POA: Diagnosis not present

## 2022-03-15 DIAGNOSIS — Z515 Encounter for palliative care: Secondary | ICD-10-CM | POA: Diagnosis not present

## 2022-03-15 DIAGNOSIS — Z7902 Long term (current) use of antithrombotics/antiplatelets: Secondary | ICD-10-CM | POA: Diagnosis not present

## 2022-03-15 DIAGNOSIS — I5043 Acute on chronic combined systolic (congestive) and diastolic (congestive) heart failure: Secondary | ICD-10-CM | POA: Diagnosis present

## 2022-03-15 DIAGNOSIS — I371 Nonrheumatic pulmonary valve insufficiency: Secondary | ICD-10-CM

## 2022-03-15 DIAGNOSIS — L8990 Pressure ulcer of unspecified site, unspecified stage: Secondary | ICD-10-CM | POA: Insufficient documentation

## 2022-03-15 DIAGNOSIS — Z8249 Family history of ischemic heart disease and other diseases of the circulatory system: Secondary | ICD-10-CM

## 2022-03-15 DIAGNOSIS — E785 Hyperlipidemia, unspecified: Secondary | ICD-10-CM | POA: Diagnosis present

## 2022-03-15 DIAGNOSIS — R0602 Shortness of breath: Secondary | ICD-10-CM | POA: Diagnosis present

## 2022-03-15 DIAGNOSIS — R4182 Altered mental status, unspecified: Secondary | ICD-10-CM | POA: Diagnosis not present

## 2022-03-15 DIAGNOSIS — I2510 Atherosclerotic heart disease of native coronary artery without angina pectoris: Secondary | ICD-10-CM | POA: Diagnosis present

## 2022-03-15 DIAGNOSIS — I252 Old myocardial infarction: Secondary | ICD-10-CM | POA: Diagnosis not present

## 2022-03-15 DIAGNOSIS — Z20822 Contact with and (suspected) exposure to covid-19: Secondary | ICD-10-CM | POA: Diagnosis present

## 2022-03-15 DIAGNOSIS — L89322 Pressure ulcer of left buttock, stage 2: Secondary | ICD-10-CM | POA: Diagnosis present

## 2022-03-15 DIAGNOSIS — I5020 Unspecified systolic (congestive) heart failure: Secondary | ICD-10-CM

## 2022-03-15 DIAGNOSIS — I469 Cardiac arrest, cause unspecified: Secondary | ICD-10-CM | POA: Diagnosis not present

## 2022-03-15 DIAGNOSIS — N179 Acute kidney failure, unspecified: Secondary | ICD-10-CM | POA: Diagnosis present

## 2022-03-15 DIAGNOSIS — E8809 Other disorders of plasma-protein metabolism, not elsewhere classified: Secondary | ICD-10-CM | POA: Diagnosis present

## 2022-03-15 DIAGNOSIS — R600 Localized edema: Secondary | ICD-10-CM | POA: Diagnosis not present

## 2022-03-15 DIAGNOSIS — Z7189 Other specified counseling: Secondary | ICD-10-CM | POA: Diagnosis not present

## 2022-03-15 DIAGNOSIS — Z7982 Long term (current) use of aspirin: Secondary | ICD-10-CM

## 2022-03-15 DIAGNOSIS — E8779 Other fluid overload: Secondary | ICD-10-CM | POA: Diagnosis not present

## 2022-03-15 DIAGNOSIS — I255 Ischemic cardiomyopathy: Secondary | ICD-10-CM | POA: Diagnosis present

## 2022-03-15 DIAGNOSIS — M7989 Other specified soft tissue disorders: Secondary | ICD-10-CM | POA: Diagnosis not present

## 2022-03-15 DIAGNOSIS — J9601 Acute respiratory failure with hypoxia: Secondary | ICD-10-CM | POA: Diagnosis not present

## 2022-03-15 HISTORY — DX: Unspecified systolic (congestive) heart failure: I50.20

## 2022-03-15 HISTORY — DX: ST elevation (STEMI) myocardial infarction involving other coronary artery of inferior wall: I21.19

## 2022-03-15 HISTORY — DX: Ischemic cardiomyopathy: I25.5

## 2022-03-15 HISTORY — DX: Other ventricular tachycardia: I47.29

## 2022-03-15 HISTORY — DX: Patient's noncompliance with other medical treatment and regimen due to unspecified reason: Z91.199

## 2022-03-15 LAB — COMPREHENSIVE METABOLIC PANEL
ALT: 26 U/L (ref 0–44)
AST: 60 U/L — ABNORMAL HIGH (ref 15–41)
Albumin: 3.6 g/dL (ref 3.5–5.0)
Alkaline Phosphatase: 219 U/L — ABNORMAL HIGH (ref 38–126)
Anion gap: 13 (ref 5–15)
BUN: 44 mg/dL — ABNORMAL HIGH (ref 8–23)
CO2: 24 mmol/L (ref 22–32)
Calcium: 9.6 mg/dL (ref 8.9–10.3)
Chloride: 96 mmol/L — ABNORMAL LOW (ref 98–111)
Creatinine, Ser: 1.58 mg/dL — ABNORMAL HIGH (ref 0.44–1.00)
GFR, Estimated: 36 mL/min — ABNORMAL LOW (ref >60)
Glucose, Bld: 87 mg/dL (ref 70–99)
Potassium: 5.2 mmol/L — ABNORMAL HIGH (ref 3.5–5.1)
Sodium: 133 mmol/L — ABNORMAL LOW (ref 135–145)
Total Bilirubin: 5 mg/dL — ABNORMAL HIGH (ref 0.3–1.2)
Total Protein: 8.6 g/dL — ABNORMAL HIGH (ref 6.5–8.1)

## 2022-03-15 LAB — CBC
HCT: 41.3 % (ref 36.0–46.0)
Hemoglobin: 13.6 g/dL (ref 12.0–15.0)
MCH: 33 pg (ref 26.0–34.0)
MCHC: 32.9 g/dL (ref 30.0–36.0)
MCV: 100.2 fL — ABNORMAL HIGH (ref 80.0–100.0)
Platelets: 185 10*3/uL (ref 150–400)
RBC: 4.12 MIL/uL (ref 3.87–5.11)
RDW: 20.2 % — ABNORMAL HIGH (ref 11.5–15.5)
WBC: 7.8 10*3/uL (ref 4.0–10.5)
nRBC: 0.4 % — ABNORMAL HIGH (ref 0.0–0.2)

## 2022-03-15 LAB — BLOOD GAS, VENOUS
Acid-Base Excess: 0.7 mmol/L (ref 0.0–2.0)
Bicarbonate: 27.1 mmol/L (ref 20.0–28.0)
Drawn by: 44828
O2 Saturation: 70.2 %
Patient temperature: 36.8
pCO2, Ven: 49 mmHg (ref 44–60)
pH, Ven: 7.35 (ref 7.25–7.43)
pO2, Ven: 43 mmHg (ref 32–45)

## 2022-03-15 LAB — TROPONIN I (HIGH SENSITIVITY)
Troponin I (High Sensitivity): 330 ng/L (ref <18)
Troponin I (High Sensitivity): 252 ng/L (ref <18)

## 2022-03-15 LAB — BRAIN NATRIURETIC PEPTIDE: B Natriuretic Peptide: >4500 pg/mL — ABNORMAL HIGH (ref 0.0–100.0)

## 2022-03-15 MED ORDER — CLOPIDOGREL BISULFATE 75 MG PO TABS
75.0000 mg | ORAL_TABLET | Freq: Every day | ORAL | Status: DC
  Administered 2022-03-16 – 2022-03-21 (×6): 75 mg via ORAL
  Filled 2022-03-15 (×6): qty 1

## 2022-03-15 MED ORDER — ASPIRIN 81 MG PO TBEC
81.0000 mg | DELAYED_RELEASE_TABLET | Freq: Every day | ORAL | Status: DC
  Administered 2022-03-16 – 2022-03-19 (×4): 81 mg via ORAL
  Filled 2022-03-15 (×6): qty 1

## 2022-03-15 MED ORDER — FUROSEMIDE 10 MG/ML IJ SOLN
40.0000 mg | Freq: Once | INTRAMUSCULAR | Status: AC
  Administered 2022-03-15: 40 mg via INTRAVENOUS
  Filled 2022-03-15: qty 4

## 2022-03-15 MED ORDER — FUROSEMIDE 10 MG/ML IJ SOLN
40.0000 mg | Freq: Two times a day (BID) | INTRAMUSCULAR | Status: DC
  Administered 2022-03-15: 40 mg via INTRAVENOUS

## 2022-03-15 MED ORDER — ACETAMINOPHEN 325 MG PO TABS: 650.0000 mg | ORAL_TABLET | Freq: Four times a day (QID) | ORAL | Status: DC | PRN

## 2022-03-15 MED ORDER — ENOXAPARIN SODIUM 30 MG/0.3ML IJ SOSY
30.0000 mg | PREFILLED_SYRINGE | INTRAMUSCULAR | Status: DC
  Administered 2022-03-15: 30 mg via SUBCUTANEOUS
  Filled 2022-03-15: qty 0.3

## 2022-03-15 MED ORDER — FUROSEMIDE 10 MG/ML IJ SOLN
40.0000 mg | Freq: Two times a day (BID) | INTRAMUSCULAR | Status: DC
  Filled 2022-03-15: qty 4

## 2022-03-15 MED ORDER — ACETAMINOPHEN 650 MG RE SUPP: 650.0000 mg | Freq: Four times a day (QID) | RECTAL | Status: DC | PRN

## 2022-03-15 MED ORDER — ASPIRIN 325 MG PO TABS
325.0000 mg | ORAL_TABLET | Freq: Once | ORAL | Status: AC
  Administered 2022-03-15: 325 mg via ORAL
  Filled 2022-03-15: qty 1

## 2022-03-15 MED ORDER — PANTOPRAZOLE SODIUM 40 MG PO TBEC
40.0000 mg | DELAYED_RELEASE_TABLET | Freq: Every day | ORAL | Status: DC
  Administered 2022-03-16 – 2022-03-19 (×4): 40 mg via ORAL
  Filled 2022-03-15 (×5): qty 1

## 2022-03-15 MED ORDER — ONDANSETRON HCL 4 MG/2ML IJ SOLN: 4.0000 mg | Freq: Four times a day (QID) | INTRAMUSCULAR | Status: DC | PRN

## 2022-03-15 MED ORDER — ONDANSETRON HCL 4 MG PO TABS: 4.0000 mg | ORAL_TABLET | Freq: Four times a day (QID) | ORAL | Status: DC | PRN

## 2022-03-15 MED ORDER — MONTELUKAST SODIUM 10 MG PO TABS
10.0000 mg | ORAL_TABLET | Freq: Every day | ORAL | Status: DC
  Administered 2022-03-17 – 2022-03-21 (×5): 10 mg via ORAL
  Filled 2022-03-15 (×5): qty 1

## 2022-03-15 MED ORDER — FUROSEMIDE 10 MG/ML IJ SOLN
INTRAMUSCULAR | Status: AC
  Filled 2022-03-15: qty 4

## 2022-03-15 MED ORDER — ATORVASTATIN CALCIUM 40 MG PO TABS
80.0000 mg | ORAL_TABLET | Freq: Every day | ORAL | Status: DC
  Administered 2022-03-16 – 2022-03-21 (×6): 80 mg via ORAL
  Filled 2022-03-15 (×6): qty 2

## 2022-03-15 MED ORDER — DAPAGLIFLOZIN PROPANEDIOL 10 MG PO TABS
10.0000 mg | ORAL_TABLET | Freq: Every day | ORAL | Status: DC
  Administered 2022-03-16 – 2022-03-21 (×6): 10 mg via ORAL
  Filled 2022-03-15 (×6): qty 1

## 2022-03-15 NOTE — Consult Note (Signed)
Cardiology Consultation:   Patient ID: Elizabeth Herring; [id number]; 12/11/1955   Admit date: 03/15/2022 Date of Consult: 03/15/2022  Primary Care Provider: Pcp, No Primary Cardiologist: Ida Rogue, MD Primary Electrophysiologist: Vickie Epley, MD   Patient Profile:   Elizabeth Herring is a 66 y.o. 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 Kathrynn Humble.  History of Present Illness:   [name] presents to the [facility] ER for evaluation of worsening shortness of breath and fluid retention.  One of her sisters present today states that she has been checking in on her and noting that she has been getting worse over the last several weeks.  Outpatient regimen is listed below, not entirely clear about compliance however.  She last saw Dr. Rockey Situ for follow-up in February of this year.  On my evaluation she was somnolent and hypoxic, being started on BiPAP by ER staff and unable to give history.  Hypertensive with heart rate in the 80s to 90s in sinus rhythm.  Creatinine 1.58, BNP greater than 4500, high-sensitivity troponin I levels in the 200-300 range.  Echocardiogram from October of last year revealed LVEF less than 20% with global hypokinesis, also moderate RV dysfunction, mild to moderate mitral regurgitation with possible moderate stenosis.  Chest x-ray shows cardiomegaly with pulmonary vascular congestion and basilar pleural effusions.  Past Medical History:  Diagnosis Date   Coronary artery disease    Severe multivessel disease with unsuccessful PCI attempt and medical management 07/2020   HFrEF (heart failure with reduced ejection fraction) (HCC)    Hyperlipidemia    Hypertension     Ischemic cardiomyopathy    Noncompliance    NSVT (nonsustained ventricular tachycardia) (HCC)    ST elevation myocardial infarction (STEMI) of inferior wall [facility])     Past Surgical History:  Procedure Laterality Date   COLONOSCOPY WITH PROPOFOL N/A 08/03/2021   Procedure: COLONOSCOPY WITH PROPOFOL;  Surgeon: Juanita Craver, MD;  Location: [place];  Service: Gastroenterology;  Laterality: N/A;   CORONARY/GRAFT ACUTE MI REVASCULARIZATION N/A 07/15/2020   Procedure: Coronary/Graft Acute MI Revascularization;  Surgeon: Yolonda Kida, MD;  Location: [place] [facility];  Service: Cardiovascular;  Laterality: N/A;   ESOPHAGOGASTRODUODENOSCOPY (EGD) WITH PROPOFOL N/A 08/03/2021   Procedure: ESOPHAGOGASTRODUODENOSCOPY (EGD) WITH PROPOFOL;  Surgeon: Juanita Craver, MD;  Location: [facility] ENDOSCOPY;  Service: Gastroenterology;  Laterality: N/A;  Guaiac positive stools   GIVENS CAPSULE STUDY N/A 08/03/2021   Procedure: GIVENS CAPSULE STUDY;  Surgeon: Juanita Craver, MD;  Location: [address];  Service: Gastroenterology;  Laterality: N/A;   LEFT HEART CATH AND CORONARY ANGIOGRAPHY N/A 07/15/2020   Procedure: LEFT HEART CATH AND CORONARY ANGIOGRAPHY;  Surgeon: Yolonda Kida, MD;  Location: [place] [facility];  Service: Cardiovascular;  Laterality: N/A;     Outpatient Medications: No current facility-administered medications on file prior to encounter.   Current Outpatient Medications on File Prior to Encounter  Medication Sig Dispense Refill   aspirin EC 81 MG tablet Take 1 tablet (81 mg total) by mouth daily. Swallow whole. Resume on 08/12/2021 30 tablet 2   atorvastatin (LIPITOR) 80 MG tablet TAKE ONE TABLET BY MOUTH ONCE EVERY DAY. 90 tablet 1   clopidogrel (PLAVIX) 75 MG tablet Take 1 tablet (75 mg total) by mouth  once daily. Resume on 08/12/2021 90 tablet 1   dapagliflozin propanediol (FARXIGA) 10 MG TABS tablet Take 1 tablet (10 mg total) by mouth daily with breakfast. Please keep appointment  for further refills 30 tablet 0   furosemide (LASIX) 20 MG tablet Take 2 tablets (40 mg total) by mouth 2 (two) times daily as directed. 360 tablet 0   montelukast (SINGULAIR) 10 MG tablet Take 1 tablet by mouth once daily. 30 tablet 2   Multiple Vitamin (MULTI-VITAMIN) tablet Take 1 tablet by mouth daily.     Olopatadine HCl (PATADAY) 0.2 % SOLN Instill 1 drop in both eyes once daily. 5 mL 2   pantoprazole (PROTONIX) 40 MG tablet Take 1 tablet (40 mg total) by mouth once daily. 30 tablet 1   potassium chloride (KLOR-CON) 10 MEQ tablet Take 1 tablet (10 mEq total) by mouth once daily. 90 tablet 0   sacubitril-valsartan (ENTRESTO) 24-26 MG Take 1 tablet by mouth 2 (two) times daily. 180 tablet 3     Allergies:    Allergies  Allergen Reactions   Chlorhexidine     Social History:   Social History   Socioeconomic History   Marital status: Single    Spouse name: Not on file   Number of children: Not on file   Years of education: Not on file   Highest education level: Not on file  Occupational History   Not on file  Tobacco Use   Smoking status: Every Day    Packs/day: 1.00    Types: Cigarettes   Smokeless tobacco: Never   Tobacco comments:    Maybe 1 cig. Per day per pt---10/20/20 TW  Vaping Use   Vaping Use: Never used  Substance and Sexual Activity   Alcohol use: Yes   Drug use: Never   Sexual activity: Not on file  Other Topics Concern   Not on file  Social History Narrative   Not on file   Social Determinants of Health   Financial Resource Strain: Not on file  Food Insecurity: Not on file  Transportation Needs: Not on file  Physical Activity: Not on file  Stress: Not on file  Social Connections: Not on file  Intimate Partner Violence: Not on file    Family History:   The patient's family history includes Hypertension in her father.  ROS:  Please see the history of present illness.  All other ROS reviewed and negative.     Physical Exam/Data:   Vitals:    03/15/22 1147 03/15/22 1147 03/15/22 1400 03/15/22 1507  BP:  125/87 (!) 133/100 (!) 133/113  Pulse:  86 88 90  Resp:  18 (!) 21 (!) 23  Temp:  (!) 97.5 F (36.4 C) 98.2 F (36.8 C)   TempSrc:  Oral Axillary   SpO2:  93% 90% 100%  Weight: 63.5 kg     Height: 5' (1.524 m)      No intake or output data in the 24 hours ending 03/15/22 1620 Filed Weights   03/15/22 1147  Weight: 63.5 kg   Body mass index is 27.34 kg/m.   Gen: Patient somnolent. HEENT: Conjunctiva and lids normal. Neck: Supple, difficult to assess JVP. Lungs: Scattered rhonchi with decreased breath sounds at the bases. Cardiac: Indistinct PMI without BS S3, soft systolic murmur. Abdomen: Soft, nontender, bowel sounds present. Extremities: Marked bilateral lower extremity edema up to the thighs. Skin: Warm and dry. Musculoskeletal: No kyphosis. Neuropsychiatric: Unable to completely assess.  EKG:  An ECG dated 03/15/2022  was personally reviewed today and demonstrated:  Sinus rhythm with incomplete right bundle branch block and nonspecific T wave changes.  Telemetry:  I personally reviewed telemetry which shows sinus rhythm.  Relevant CV Studies:  Cardiac catheterization 07/15/2020: Prox RCA lesion is 100% stenosed. CTO Mid LM lesion is 25% stenosed. Dist LAD lesion is 95% stenosed. Prox LAD to Dist LAD lesion is 75% stenosed. Dist LM to Prox LAD lesion is 50% stenosed. Prox Cx to Mid Cx lesion is 100% stenosed. In-stent thrombosis of the stent IRA TIMI 0 flow Moderate collaterals left to right left to left Left ventricular function severely depressed left ventricular enlargement EF between 25 and 30% globally Unsuccessful attempted PCI and stent to ostial old stent to the circumflex with in-stent thrombosis failure to cross with a wire   Conclusion STEMI presentation in house late recognition Troponins were greater than 27,000 EKG had diffuse ST elevation inferior laterally at admission and 24 hours later  when STEMI was called Diagnostic cardiac cath showed severely depressed left ventricular function ejection fraction was between 25 and 30% with left ventricular enlargement Coronaries Left main large minor distal disease  LAD was large diffuse 50 to 75% proximal to mid, diffuse 75 to 95% mid to distal Circumflex is large ostial stent occluded thrombotic in-stent thrombosis from an old stent TIMI 0 flow RCA medium to small a completely occluded proximally CTO  Echocardiogram 02/11/2021:  1. Left ventricular ejection fraction, by estimation, is <20%. The left  ventricle has severely decreased function. The left ventricle demonstrates  global hypokinesis. The left ventricular internal cavity size was  moderately dilated. Left ventricular  diastolic parameters are consistent with Grade II diastolic dysfunction  (pseudonormalization).   2. Right ventricular systolic function is moderately reduced. The right  ventricular size is normal. There is mildly elevated pulmonary artery  systolic pressure. The estimated right ventricular systolic pressure is  54.6 mmHg.   3. Left atrial size was mildly dilated.   4. Right atrial size was mildly dilated.   5. The mitral valve is normal in structure. Mild to moderate mitral valve  regurgitation. Visually there appears to be moderate mitral stenosis.  Gradient not measured by tech.   6. Tricuspid valve regurgitation is severe.   7. The inferior vena cava is normal in size with <50% respiratory  variability, suggesting right atrial pressure of 8 mmHg.   Laboratory Data:  Chemistry Recent Labs  Lab 03/15/22 1307  NA 133*  K 5.2*  CL 96*  CO2 24  GLUCOSE 87  BUN 44*  CREATININE 1.58*  CALCIUM 9.6  GFRNONAA 36*  ANIONGAP 13    Recent Labs  Lab 03/15/22 1307  PROT 8.6*  ALBUMIN 3.6  AST 60*  ALT 26  ALKPHOS 219*  BILITOT 5.0*   Hematology Recent Labs  Lab 03/15/22 1307  WBC 7.8  RBC 4.12  HGB 13.6  HCT 41.3  MCV 100.2*  MCH  33.0  MCHC 32.9  RDW 20.2*  PLT 185   Cardiac Enzymes Recent Labs  Lab 03/15/22 1307 03/15/22 1513  TROPONINIHS 330* 252*   BNP Recent Labs  Lab 03/15/22 1307  BNP >4,500.0*    Radiology/Studies:  DG Chest Port 1 View  Result Date: 03/15/2022 CLINICAL DATA:  Shortness of breath, BILATERAL lower extremity swelling for 2-3 days, history CHF EXAM: PORTABLE CHEST 1 VIEW COMPARISON:  Portable exam 1223 hours compared to 07/15/2020 FINDINGS: Enlargement of cardiac silhouette with pulmonary vascular congestion. Atherosclerotic calcification aorta. Bibasilar effusions and atelectasis.  No definite infiltrate or edema. No pneumothorax. Skin folds project over LEFT chest. Bones demineralized. IMPRESSION: Enlargement of cardiac silhouette with slight pulmonary vascular congestion. Bibasilar pleural effusions and atelectasis. Aortic Atherosclerosis (ICD10-I70.0). Electronically Signed   By: Lavonia Dana M.D.   On: 03/15/2022 12:33    Assessment and Plan:   High risk patient with poor prognosis and the following problems:  1.  HFrEF with history of ischemic cardiomyopathy and limited revascularization options being managed medically, unfortunately complicated by noncompliance and no recent cardiac follow-up.  Echocardiogram from October of last year showed LVEF less than 20% with associated RV dysfunction.  She presents with significant fluid overload, hypoxia and altered mental status.  2.  Elevated high-sensitivity troponin I levels most likely reflective of demand ischemia rather than true ACS.  3.  CKD stage IIIb, current creatinine up to 1.58 and potassium 5.2.  Unclear if she has been compliant with recent medications including [place] and Entresto.  4.  History of NSVT in the setting of ischemic cardiomyopathy with LVEF less than 20%, felt to be poor candidate for ICD in light of noncompliance.  5.  Multivessel CAD status post inferior STEMI last year with unsuccessful PCI and plan for  medical therapy.  Patient is being admitted to the hospitalist service, stabilizing on BiPAP in ER.  Will get a follow-up echocardiogram, initiate IV Lasix with plan for diuresis as tolerated.  Continue aspirin, Plavix, Lipitor, and [place].  Would hold off on resuming Entresto until renal function trend is better understood following adequate diuresis.  Would also hold off on beta-blocker in the setting of acute heart failure, this can be reconsidered depending on how she does clinically with diuresis.  Signed, Rozann Lesches, MD  03/15/2022 4:20 PM

## 2022-03-15 NOTE — ED Notes (Signed)
Date and time results received: 03/15/22 1411 (use smartphrase ".now" to insert current time)  Test: Trop Critical Value: 330  Name of Provider Notified: Dr. Matilde Sprang  Orders Received? Or Actions Taken?: see chart

## 2022-03-15 NOTE — H&P (Signed)
TRH H&P    Patient Demographics:    Elizabeth Herring, is a 66 y.o. female  MRN: [id number]  DOB - Dec 14, 1955  Admit Date - 03/15/2022  Referring MD/NP/PA:   Outpatient Primary MD for the patient is Pcp, No  Patient coming from: Home  Chief complaint-leg swelling   HPI:    Elizabeth Herring  is a 66 y.o. 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 Patient denies chest pain or shortness of breath at this time.  Denies nausea vomiting or diarrhea.  Denies abdominal pain.  In the ED she was found to have BNP greater than 4500, troponin elevated 330, 452.  Cardiology was consulted, patient started on IV Lasix.  Also found to have elevated alk phos 219, AST 60, ALT 26, total bilirubin 5.0  Initially BiPAP was applied, BiPAP has been weaned off.  Currently on room air.    Review of systems:    In addition to the HPI above,    All other systems reviewed and are negative.    Past History of the following :    Past Medical History:  Diagnosis Date   Coronary artery disease    Severe multivessel disease with unsuccessful PCI attempt and medical management 07/2020   HFrEF (heart failure with reduced ejection fraction) (HCC)    Hyperlipidemia    Hypertension    Ischemic cardiomyopathy    Noncompliance    NSVT (nonsustained ventricular tachycardia) (HCC)    ST elevation myocardial infarction (STEMI) of inferior wall [facility])       Past Surgical History:  Procedure Laterality Date   COLONOSCOPY WITH PROPOFOL N/A 08/03/2021   Procedure: COLONOSCOPY WITH PROPOFOL;  Surgeon:  Juanita Craver, MD;  Location: [place];  Service: Gastroenterology;  Laterality: N/A;   CORONARY/GRAFT ACUTE MI REVASCULARIZATION N/A 07/15/2020   Procedure: Coronary/Graft Acute MI Revascularization;  Surgeon: Yolonda Kida, MD;  Location: [place] [facility];  Service: Cardiovascular;  Laterality: N/A;   ESOPHAGOGASTRODUODENOSCOPY (EGD) WITH PROPOFOL N/A 08/03/2021   Procedure: ESOPHAGOGASTRODUODENOSCOPY (EGD) WITH PROPOFOL;  Surgeon: Juanita Craver, MD;  Location: [facility] ENDOSCOPY;  Service: Gastroenterology;  Laterality: N/A;  Guaiac positive stools   GIVENS CAPSULE STUDY N/A 08/03/2021   Procedure: GIVENS CAPSULE STUDY;  Surgeon: Juanita Craver, MD;  Location: [place];  Service: Gastroenterology;  Laterality: N/A;   LEFT HEART CATH AND CORONARY ANGIOGRAPHY N/A 07/15/2020   Procedure: LEFT HEART CATH AND CORONARY ANGIOGRAPHY;  Surgeon: Yolonda Kida, MD;  Location: [place] [facility];  Service: Cardiovascular;  Laterality: N/A;      Social History:      Social History   Tobacco Use   Smoking status: Every Day    Packs/day: 1.00    Types: Cigarettes   Smokeless tobacco: Never   Tobacco comments:  Maybe 1 cig. Per day per pt---10/20/20 TW  Substance Use Topics   Alcohol use: Yes       Family History :     Family History  Problem Relation Age of Onset   Hypertension Father      Home Medications:   Prior to Admission medications   Medication Sig Start Date End Date Taking? Authorizing Provider  aspirin EC 81 MG tablet Take 1 tablet (81 mg total) by mouth daily. Swallow whole. Resume on 08/12/2021 08/04/21 08/04/22  Kathie Dike, MD  atorvastatin (LIPITOR) 80 MG tablet TAKE ONE TABLET BY MOUTH ONCE EVERY DAY. 03/03/21 03/03/22  Loel Dubonnet, NP  clopidogrel (PLAVIX) 75 MG tablet Take 1 tablet (75 mg total) by mouth once daily. Resume on 08/12/2021 08/04/21   Kathie Dike, MD  dapagliflozin propanediol (FARXIGA) 10 MG TABS tablet Take 1 tablet (10 mg total) by  mouth daily with breakfast. Please keep appointment for further refills 01/17/22   Minna Merritts, MD  furosemide (LASIX) 20 MG tablet Take 2 tablets (40 mg total) by mouth 2 (two) times daily as directed. 01/14/22   Minna Merritts, MD  montelukast (SINGULAIR) 10 MG tablet Take 1 tablet by mouth once daily. 06/14/21     Multiple Vitamin (MULTI-VITAMIN) tablet Take 1 tablet by mouth daily.    [provider]  Olopatadine HCl (PATADAY) 0.2 % SOLN Instill 1 drop in both eyes once daily. 06/14/21     pantoprazole (PROTONIX) 40 MG tablet Take 1 tablet (40 mg total) by mouth once daily. 08/04/21   Kathie Dike, MD  potassium chloride (KLOR-CON) 10 MEQ tablet Take 1 tablet (10 mEq total) by mouth once daily. 01/14/22   Minna Merritts, MD  sacubitril-valsartan (ENTRESTO) 24-26 MG Take 1 tablet by mouth 2 (two) times daily. 09/16/21   Deberah Pelton, NP     Allergies:     Allergies  Allergen Reactions   Chlorhexidine      Physical Exam:   Vitals  Blood pressure (!) 133/95, pulse 94, temperature 97.7 F (36.5 C), temperature source Oral, resp. rate (!) 22, height 5' (1.524 m), weight 63.5 kg, SpO2 95 %.  1.  General: Appears in no acute distress  2. Psychiatric: Alert , Oriented x3, intact insight and judgment  3. Neurologic: Cranial nerves II through XII grossly intact, no focal deficit noted  4. HEENMT:  Atraumatic normocephalic, extraocular muscles are intact  5. Respiratory : Clear to auscultation bilaterally  6. Cardiovascular : S1-S2, regular, no murmur auscultated   7. Gastrointestinal:  Abdomen is soft, nontender, no organomegaly  8. Skin:  Skin breakdown noted bilaterally in lower extremities  9.Musculoskeletal:  Bilateral 2+ pitting edema of the lower extremities    Data Review:    CBC Recent Labs  Lab 03/15/22 1307  WBC 7.8  HGB 13.6  HCT 41.3  PLT 185  MCV 100.2*  MCH 33.0  MCHC 32.9  RDW 20.2*    ------------------------------------------------------------------------------------------------------------------  Results for orders placed or performed during the hospital encounter of 03/15/22 (from the past 48 hour(s))  CBC     Status: Abnormal   Collection Time: 03/15/22  1:07 PM  Result Value Ref Range   WBC 7.8 4.0 - 10.5 K/uL   RBC 4.12 3.87 - 5.11 MIL/uL   Hemoglobin 13.6 12.0 - 15.0 g/dL   HCT 41.3 36.0 - 46.0 %   MCV 100.2 (H) 80.0 - 100.0 fL   MCH 33.0 26.0 - 34.0 pg  MCHC 32.9 30.0 - 36.0 g/dL   RDW 20.2 (H) 11.5 - 15.5 %   Platelets 185 150 - 400 K/uL   nRBC 0.4 (H) 0.0 - 0.2 %    Comment: Performed at [facility], [address] [place], [place] [address]  Comprehensive metabolic panel     Status: Abnormal   Collection Time: 03/15/22  1:07 PM  Result Value Ref Range   Sodium 133 (L) 135 - 145 mmol/L   Potassium 5.2 (H) 3.5 - 5.1 mmol/L   Chloride 96 (L) 98 - 111 mmol/L   CO2 24 22 - 32 mmol/L   Glucose, Bld 87 70 - 99 mg/dL    Comment: Glucose reference range applies only to samples taken after fasting for at least 8 hours.   BUN 44 (H) 8 - 23 mg/dL   Creatinine, Ser 1.58 (H) 0.44 - 1.00 mg/dL   Calcium 9.6 8.9 - 10.3 mg/dL   Total Protein 8.6 (H) 6.5 - 8.1 g/dL   Albumin 3.6 3.5 - 5.0 g/dL   AST 60 (H) 15 - 41 U/L   ALT 26 0 - 44 U/L   Alkaline Phosphatase 219 (H) 38 - 126 U/L   Total Bilirubin 5.0 (H) 0.3 - 1.2 mg/dL   GFR, Estimated 36 (L) >60 mL/min    Comment: (NOTE) Calculated using the CKD-EPI Creatinine Equation (2021)    Anion gap 13 5 - 15    Comment: Performed at [facility], [address] [place], [place] [address]  Brain natriuretic peptide     Status: Abnormal   Collection Time: 03/15/22  1:07 PM  Result Value Ref Range   B Natriuretic Peptide >4,500.0 (H) 0.0 - 100.0 pg/mL    Comment: Performed at [facility], [address] [place], [place] [address]  Troponin I (High Sensitivity)     Status: Abnormal   Collection Time: 03/15/22   1:07 PM  Result Value Ref Range   Troponin I (High Sensitivity) 330 (HH) <18 ng/L    Comment: CRITICAL RESULT CALLED TO, READ BACK BY AND VERIFIED WITH: Cheryll Dessert RN [phone] [id number] K FORSYTH (NOTE) Elevated high sensitivity troponin I (hsTnI) values and significant  changes across serial measurements may suggest ACS but many other  chronic and acute conditions are known to elevate hsTnI results.  Refer to the Links section for chest pain algorithms and additional  guidance. Performed at [facility], [address], [place], [place] [address]   Troponin I (High Sensitivity)     Status: Abnormal   Collection Time: 03/15/22  3:13 PM  Result Value Ref Range   Troponin I (High Sensitivity) 252 (HH) <18 ng/L    Comment: DELTA CHECK NOTED CRITICAL RESULT CALLED TO, READ BACK BY AND VERIFIED WITH: DOSS,M ON 03/15/22 AT 1600 BY LOY,C (NOTE) Elevated high sensitivity troponin I (hsTnI) values and significant  changes across serial measurements may suggest ACS but many other  chronic and acute conditions are known to elevate hsTnI results.  Refer to the Links section for chest pain algorithms and additional  guidance. Performed at [facility], [address], [place],  [address]   Blood gas, venous (at [facility], not at [facility])     Status: None   Collection Time: 03/15/22  3:13 PM  Result Value Ref Range   pH, Ven 7.35 7.25 - 7.43   pCO2, Ven 49 44 - 60 mmHg   pO2, Ven 43 32 - 45 mmHg   Bicarbonate 27.1 20.0 -  28.0 mmol/L   Acid-Base Excess 0.7 0.0 - 2.0 mmol/L   O2 Saturation 70.2 %   Patient temperature 36.8    Collection site BLOOD RIGHT WRIST    Drawn by [phone]     Comment: Performed at [facility], [address] [place], [place] [address]    Chemistries  Recent Labs  Lab 03/15/22 1307  NA 133*  K 5.2*  CL 96*  CO2 24  GLUCOSE 87  BUN 44*  CREATININE 1.58*  CALCIUM 9.6  AST 60*  ALT 26  ALKPHOS 219*  BILITOT 5.0*    ------------------------------------------------------------------------------------------------------------------  ------------------------------------------------------------------------------------------------------------------ GFR: Estimated Creatinine Clearance: 29.5 mL/min (A) (by C-G formula based on SCr of 1.58 mg/dL (H)). Liver Function Tests: Recent Labs  Lab 03/15/22 1307  AST 60*  ALT 26  ALKPHOS 219*  BILITOT 5.0*  PROT 8.6*  ALBUMIN 3.6   No results for input(s): "LIPASE", "AMYLASE" in the last 168 hours. No results for input(s): "AMMONIA" in the last 168 hours. Coagulation Profile: No results for input(s): "INR", "PROTIME" in the last 168 hours. Cardiac Enzymes: No results for input(s): "CKTOTAL", "CKMB", "CKMBINDEX", "TROPONINI" in the last 168 hours. BNP (last 3 results) No results for input(s): "PROBNP" in the last 8760 hours. HbA1C: No results for input(s): "HGBA1C" in the last 72 hours. CBG: No results for input(s): "GLUCAP" in the last 168 hours. Lipid Profile: No results for input(s): "CHOL", "HDL", "LDLCALC", "TRIG", "CHOLHDL", "LDLDIRECT" in the last 72 hours. Thyroid Function Tests: No results for input(s): "TSH", "T4TOTAL", "FREET4", "T3FREE", "THYROIDAB" in the last 72 hours. Anemia Panel: No results for input(s): "VITAMINB12", "FOLATE", "FERRITIN", "TIBC", "IRON", "RETICCTPCT" in the last 72 hours.  --------------------------------------------------------------------------------------------------------------- Urine analysis: No results found for: "COLORURINE", "APPEARANCEUR", "LABSPEC", "PHURINE", "GLUCOSEU", "HGBUR", "BILIRUBINUR", "KETONESUR", "PROTEINUR", "UROBILINOGEN", "NITRITE", "LEUKOCYTESUR"    Imaging Results:    DG Chest Port 1 View  Result Date: 03/15/2022 CLINICAL DATA:  Shortness of breath, BILATERAL lower extremity swelling for 2-3 days, history CHF EXAM: PORTABLE CHEST 1 VIEW COMPARISON:  Portable exam 1223 hours compared to  07/15/2020 FINDINGS: Enlargement of cardiac silhouette with pulmonary vascular congestion. Atherosclerotic calcification aorta. Bibasilar effusions and atelectasis. No definite infiltrate or edema. No pneumothorax. Skin folds project over LEFT chest. Bones demineralized. IMPRESSION: Enlargement of cardiac silhouette with slight pulmonary vascular congestion. Bibasilar pleural effusions and atelectasis. Aortic Atherosclerosis (ICD10-I70.0). Electronically Signed   By: Lavonia Dana M.D.   On: 03/15/2022 12:33    My personal review of EKG: Rhythm NSR, no ST changes   Assessment & Plan:    Principal Problem:   Acute exacerbation of CHF (congestive heart failure) (HCC)   Acute on chronic systolic heart failure-patient has EF 20% as per echo from October last year.  Started on Lasix 40 mg IV twice daily.  Cardiology has been consulted.  We will obtain daily weights.  Strict I's and O's.  Follow BMP in am.  Continue Farxiga.  Entresto on hold as per cardiology recommendation. Bilateral lower extremity edema-secondary to above.  She also has open areas in lower extremities.  We will get wound care consult for wound care management of lower extremity wounds. Elevated troponin-likely in the setting of demand ischemia from CHF exacerbation as above.  Denies chest pain. CAD-patient has multivessel disease, not amenable to PCI.  Continue aspirin, Plavix, Lipitor.  Metoprolol is currently on hold due to CHF exacerbation as above. Transaminitis-patient has transaminitis with increased bilirubin.  We will obtain abdominal ultrasound. CKD stage IIIb-creatinine 1.58, slightly above baseline  of 1.21 as of March 2023.[name]  Entresto on hold.  Follow renal function in a.m.   DVT Prophylaxis-   Lovenox   AM Labs Ordered, also please review Full Orders  Family Communication: Admission, patients condition and plan of care including tests being ordered have been discussed with the patient and her sister at bedside who  indicate understanding and agree with the plan and Code Status.  Code Status:    Admission status: Inpatient :The appropriate admission status for this patient is INPATIENT. Inpatient status is judged to be reasonable and necessary in order to provide the required intensity of service to ensure the patient's safety. The patient's presenting symptoms, physical exam findings, and initial radiographic and laboratory data in the context of their chronic comorbidities is felt to place them at high risk for further clinical deterioration. Furthermore, it is not anticipated that the patient will be medically stable for discharge from the hospital within 2 midnights of admission. The following factors support the admission status of inpatient.      Time spent in minutes : 60 min   Gagan S Lama M.D

## 2022-03-15 NOTE — ED Provider Notes (Addendum)
  Physical Exam  BP 124/89   Pulse 95   Temp 97.7 F (36.5 C) (Axillary)   Resp (!) 0   Ht 5' (1.524 m)   Wt 63.5 kg   SpO2 100%   BMI 27.34 kg/m   Physical Exam  Procedures  .Critical Care  Performed by: Varney Biles, MD Authorized by: Varney Biles, MD   Critical care provider statement:    Critical care time (minutes):  32   Critical care was necessary to treat or prevent imminent or life-threatening deterioration of the following conditions:  CNS failure or compromise and circulatory failure   Critical care was time spent personally by me on the following activities:  Development of treatment plan with patient or surrogate, discussions with consultants, evaluation of patient's response to treatment, examination of patient, ordering and review of laboratory studies, ordering and review of radiographic studies, ordering and performing treatments and interventions, pulse oximetry, re-evaluation of patient's condition, review of old charts and obtaining history from patient or surrogate   ED Course / MDM    Medical Decision Making Amount and/or Complexity of Data Reviewed Labs: ordered. Radiology: ordered.  Risk OTC drugs. Prescription drug management.   Pt with hx of CAD, CHF. Has pitting edema. + trop and +++ BNP.  Now has somnolence - on bipap. Cardiology service has seen the patient, but unable to fully get history because she is somnolent. Venous blood gas has been ordered. I have assessed the patient at 3:30 pm, at the time of signout.   Reassessment at 5:30 pm: I reviewed patient's chart.  She had ST elevation MI in March 2022.  Subsequently found to have EF less than 20%.  She is on Lasix twice daily. On my assessment, patient has significant pitting edema with some weeping. Sister is at the bedside.  States that patient's swelling started getting worse over the last 3 or 4 days. Patient sleepy, but answers all questions appropriately.  Follows simple  commands. Venous blood gas reviewed independently.  No evidence of hypercapnia. X-ray of the chest independently reviewed.  No clear evidence of significant pulmonary edema.  I have reviewed cardiology consultation note.  They recommend admission.  I have requested that we discontinue the BiPAP machine. We will reassess the patient once BiPAP is removed.  She is stable for admission.   5:40 PM High-sensitivity troponin has trended down, from over 300-2 52. Patient's creatinine is still at her baseline. She is noted to have elevated bilirubin of over 5 and slightly elevated alk phos.  Ultrasound right upper quadrant ordered. Primary complaint has not been abdominal pain.   Varney Biles, MD 03/15/22 [phone]

## 2022-03-15 NOTE — ED Notes (Signed)
Verbal report given to transferring unit 300.

## 2022-03-15 NOTE — ED Triage Notes (Signed)
Pt presents to ED via [facility] for bilateral lower extremity swelling x 2-3 days. Hx CHF. Pt states she has SOB at times. Pt normally able to stand and walk with walker but unable to bare weight today due to swelling

## 2022-03-15 NOTE — ED Provider Notes (Signed)
[facility] EMERGENCY DEPARTMENT Provider Note  CSN: [id number] Arrival date & time: 03/15/22 1138  Chief Complaint(s) Leg Swelling  HPI Elizabeth Herring is a 66 y.o. female with PMH CAD, CHF with severe cardiomyopathy with EF less than 20% who presents emergency department for evaluation of lower extremity edema and orthopnea.  Patient states that over the last 2 to 3 days she has noticed a significant increased swelling of her lower extremities and states that she feels like she has gained approximately 12 to 20 pounds.  She states normally she is able to ambulate without difficulty but she cannot walk due to the significant swelling in her legs.  She endorses shortness of breath and states that she is unable to lie flat due to the shortness of breath.  Denies active chest pain, abdominal pain, nausea, vomiting or other systemic symptoms.  She states she has been trying to remain compliant with her outpatient diuretics but has been having worsening lower extremity edema despite this.   Past Medical History Past Medical History:  Diagnosis Date   Coronary artery disease    Severe multivessel disease with unsuccessful PCI attempt and medical management 07/2020   HFrEF (heart failure with reduced ejection fraction) (HCC)    Hyperlipidemia    Hypertension    Ischemic cardiomyopathy    Noncompliance    NSVT (nonsustained ventricular tachycardia) (HCC)    ST elevation myocardial infarction (STEMI) of inferior wall [facility])    Patient Active Problem List   Diagnosis Date Noted   Acute exacerbation of CHF (congestive heart failure) ([place]) 03/15/2022   Acute upper gastrointestinal bleeding 08/02/2021   Acute blood loss anemia 08/02/2021   Generalized weakness 08/02/2021   Chronic combined systolic and diastolic heart failure ([place]) 08/02/2021   Hyperlipidemia 08/02/2021   Acute lower GI bleeding 08/01/2021   STEMI (ST elevation myocardial infarction) ([place]) 07/18/2020   Ischemic  cardiomyopathy 07/18/2020   Smoker 07/18/2020   Pneumonia 07/15/2020   PNA (pneumonia) 07/14/2020   Bronchitis due to tobacco use 07/14/2020   Essential hypertension 07/14/2020   Home Medication(s) Prior to Admission medications   Medication Sig Start Date End Date Taking? Authorizing Provider  aspirin EC 81 MG tablet Take 1 tablet (81 mg total) by mouth daily. Swallow whole. Resume on 08/12/2021 08/04/21 08/04/22  Kathie Dike, MD  atorvastatin (LIPITOR) 80 MG tablet TAKE ONE TABLET BY MOUTH ONCE EVERY DAY. 03/03/21 03/03/22  Loel Dubonnet, NP  clopidogrel (PLAVIX) 75 MG tablet Take 1 tablet (75 mg total) by mouth once daily. Resume on 08/12/2021 08/04/21   Kathie Dike, MD  dapagliflozin propanediol (FARXIGA) 10 MG TABS tablet Take 1 tablet (10 mg total) by mouth daily with breakfast. Please keep appointment for further refills 01/17/22   Minna Merritts, MD  furosemide (LASIX) 20 MG tablet Take 2 tablets (40 mg total) by mouth 2 (two) times daily as directed. 01/14/22   Minna Merritts, MD  montelukast (SINGULAIR) 10 MG tablet Take 1 tablet by mouth once daily. 06/14/21     Multiple Vitamin (MULTI-VITAMIN) tablet Take 1 tablet by mouth daily.    [provider]  Olopatadine HCl (PATADAY) 0.2 % SOLN Instill 1 drop in both eyes once daily. 06/14/21     pantoprazole (PROTONIX) 40 MG tablet Take 1 tablet (40 mg total) by mouth once daily. 08/04/21   Kathie Dike, MD  potassium chloride (KLOR-CON) 10 MEQ tablet Take 1 tablet (10 mEq total) by mouth once daily. 01/14/22   Minna Merritts,  MD  sacubitril-valsartan (ENTRESTO) 24-26 MG Take 1 tablet by mouth 2 (two) times daily. 09/16/21   Deberah Pelton, NP                                                                                                                                    Past Surgical History Past Surgical History:  Procedure Laterality Date   COLONOSCOPY WITH PROPOFOL N/A 08/03/2021   Procedure: COLONOSCOPY WITH  PROPOFOL;  Surgeon: Juanita Craver, MD;  Location: [place];  Service: Gastroenterology;  Laterality: N/A;   CORONARY/GRAFT ACUTE MI REVASCULARIZATION N/A 07/15/2020   Procedure: Coronary/Graft Acute MI Revascularization;  Surgeon: Yolonda Kida, MD;  Location: [place] CV LAB;  Service: Cardiovascular;  Laterality: N/A;   ESOPHAGOGASTRODUODENOSCOPY (EGD) WITH PROPOFOL N/A 08/03/2021   Procedure: ESOPHAGOGASTRODUODENOSCOPY (EGD) WITH PROPOFOL;  Surgeon: Juanita Craver, MD;  Location: [facility] ENDOSCOPY;  Service: Gastroenterology;  Laterality: N/A;  Guaiac positive stools   GIVENS CAPSULE STUDY N/A 08/03/2021   Procedure: GIVENS CAPSULE STUDY;  Surgeon: Juanita Craver, MD;  Location: [place];  Service: Gastroenterology;  Laterality: N/A;   LEFT HEART CATH AND CORONARY ANGIOGRAPHY N/A 07/15/2020   Procedure: LEFT HEART CATH AND CORONARY ANGIOGRAPHY;  Surgeon: Yolonda Kida, MD;  Location: [place] [facility];  Service: Cardiovascular;  Laterality: N/A;   Family History Family History  Problem Relation Age of Onset   Hypertension Father     Social History Social History   Tobacco Use   Smoking status: Every Day    Packs/day: 1.00    Types: Cigarettes   Smokeless tobacco: Never   Tobacco comments:    Maybe 1 cig. Per day per pt---10/20/20 TW  Vaping Use   Vaping Use: Never used  Substance Use Topics   Alcohol use: Yes   Drug use: Never   Allergies Chlorhexidine  Review of Systems Review of Systems  Respiratory:  Positive for shortness of breath.   Cardiovascular:  Positive for leg swelling.    Physical Exam Vital Signs  I have reviewed the triage vital signs BP (!) 133/95 (BP Location: Left Arm)   Pulse 94   Temp 97.7 F (36.5 C) (Oral)   Resp (!) 22   Ht 5' (1.524 m)   Wt 63.5 kg   SpO2 95%   BMI 27.34 kg/m   Physical Exam Vitals and nursing note reviewed.  Constitutional:      General: She is not in acute distress.    Appearance: She is well-developed.   HENT:     Head: Normocephalic and atraumatic.  Eyes:     Conjunctiva/sclera: Conjunctivae normal.  Cardiovascular:     Rate and Rhythm: Normal rate and regular rhythm.     Heart sounds: No murmur heard. Pulmonary:     Effort: Pulmonary effort is normal. No respiratory distress.     Breath sounds: Normal breath sounds. No rales.  Abdominal:  Palpations: Abdomen is soft.     Tenderness: There is no abdominal tenderness.  Musculoskeletal:        General: No swelling.     Cervical back: Neck supple.     Right lower leg: Edema present.     Left lower leg: Edema present.  Skin:    General: Skin is warm and dry.     Capillary Refill: Capillary refill takes less than 2 seconds.  Neurological:     Mental Status: She is alert.  Psychiatric:        Mood and Affect: Mood normal.     ED Results and Treatments Labs (all labs ordered are listed, but only abnormal results are displayed) Labs Reviewed  CBC - Abnormal; Notable for the following components:      Result Value   MCV 100.2 (*)    RDW 20.2 (*)    nRBC 0.4 (*)    All other components within normal limits  COMPREHENSIVE METABOLIC PANEL - Abnormal; Notable for the following components:   Sodium 133 (*)    Potassium 5.2 (*)    Chloride 96 (*)    BUN 44 (*)    Creatinine, Ser 1.58 (*)    Total Protein 8.6 (*)    AST 60 (*)    Alkaline Phosphatase 219 (*)    Total Bilirubin 5.0 (*)    GFR, Estimated 36 (*)    All other components within normal limits  BRAIN NATRIURETIC PEPTIDE - Abnormal; Notable for the following components:   B Natriuretic Peptide >4,500.0 (*)    All other components within normal limits  TROPONIN I (HIGH SENSITIVITY) - Abnormal; Notable for the following components:   Troponin I (High Sensitivity) 330 (*)    All other components within normal limits  TROPONIN I (HIGH SENSITIVITY) - Abnormal; Notable for the following components:   Troponin I (High Sensitivity) 252 (*)    All other components  within normal limits  BLOOD GAS, VENOUS                                                                                                                          Radiology DG Chest Port 1 View  Result Date: 03/15/2022 CLINICAL DATA:  Shortness of breath, BILATERAL lower extremity swelling for 2-3 days, history CHF EXAM: PORTABLE CHEST 1 VIEW COMPARISON:  Portable exam 1223 hours compared to 07/15/2020 FINDINGS: Enlargement of cardiac silhouette with pulmonary vascular congestion. Atherosclerotic calcification aorta. Bibasilar effusions and atelectasis. No definite infiltrate or edema. No pneumothorax. Skin folds project over LEFT chest. Bones demineralized. IMPRESSION: Enlargement of cardiac silhouette with slight pulmonary vascular congestion. Bibasilar pleural effusions and atelectasis. Aortic Atherosclerosis (ICD10-I70.0). Electronically Signed   By: Lavonia Dana M.D.   On: 03/15/2022 12:33    Pertinent labs & imaging results that were available during my care of the patient were reviewed by me and considered in my medical decision making (see MDM for details).  Medications Ordered in ED Medications  furosemide (LASIX) 10 MG/ML injection (  Canceled Entry 03/15/22 1650)  furosemide (LASIX) injection 40 mg (has no administration in time range)  furosemide (LASIX) injection 40 mg (40 mg Intravenous Given 03/15/22 1422)  aspirin tablet 325 mg (325 mg Oral Given 03/15/22 1423)                                                                                                                                     Procedures .Critical Care  Performed by: Teressa Lower, MD Authorized by: Teressa Lower, MD   Critical care provider statement:    Critical care time (minutes):  30   Critical care was necessary to treat or prevent imminent or life-threatening deterioration of the following conditions:  Respiratory failure, cardiac failure and CNS failure or compromise   Critical care was time spent  personally by me on the following activities:  Development of treatment plan with patient or surrogate, discussions with consultants, evaluation of patient's response to treatment, examination of patient, ordering and review of laboratory studies, ordering and review of radiographic studies, ordering and performing treatments and interventions, pulse oximetry, re-evaluation of patient's condition and review of old charts   (including critical care time)  Medical Decision Making / ED Course   This patient presents to the ED for concern of lower extremity edema, shortness of breath, this involves an extensive number of treatment options, and is a complaint that carries with it a high risk of complications and morbidity.  The differential diagnosis includes CHF exacerbation, hypoalbuminemia, liver failure, renal failure, failure of outpatient diuretics, hypercarbia, pleural effusions  MDM: Patient seen in the emergency department for evaluation of shortness of breath and lower extremity edema.  Physical exam with significant bilateral lower extremity pitting edema and rales at bilateral bases.  Initially, patient was alert and oriented answering questions appropriately but just felt moderately fatigued.  Laboratory work-up with hyperkalemia to 5.2 hyponatremia 133, hypochloremia to 96, creatinine 1.58, BUN 44, alk phos 219, total bili 5.0 which is elevated from previous, CBC unremarkable.  Chest x-ray with bilateral pleural effusions and pulmonary edema.  BNP is greater than 4500.  Lasix initiated.  Initial troponin is significantly elevated at 330.  Aspirin given and cardiology consulted and I spoke with Dr. Domenic Polite who came to evaluate the patient at bedside.  On cardiology evaluation, it appears the patient's respiratory status and mental status have started to decline and I personally reevaluate the patient and found the patient to be significantly more somnolent than previous and saturating 88% on room  air.  She was placed initially on nasal cannula and ultimately transition to BiPAP for work of breathing and fluid overload.  She did not have elevated blood pressures consistent with flash pulmonary edema and thus nitroglycerin drip not initiated.  At time of signout, patient pending VBG, delta troponin to risk stratify the patient for appropriate level of disposition.  Please see provider  signout for continuation of work-up.  Patient will ultimately require hospital admission.   Additional history obtained: -Additional history obtained from friend -External records from outside source obtained and reviewed including: Chart review including previous notes, labs, imaging, consultation notes   Lab Tests: -I ordered, reviewed, and interpreted labs.   The pertinent results include:   Labs Reviewed  CBC - Abnormal; Notable for the following components:      Result Value   MCV 100.2 (*)    RDW 20.2 (*)    nRBC 0.4 (*)    All other components within normal limits  COMPREHENSIVE METABOLIC PANEL - Abnormal; Notable for the following components:   Sodium 133 (*)    Potassium 5.2 (*)    Chloride 96 (*)    BUN 44 (*)    Creatinine, Ser 1.58 (*)    Total Protein 8.6 (*)    AST 60 (*)    Alkaline Phosphatase 219 (*)    Total Bilirubin 5.0 (*)    GFR, Estimated 36 (*)    All other components within normal limits  BRAIN NATRIURETIC PEPTIDE - Abnormal; Notable for the following components:   B Natriuretic Peptide >4,500.0 (*)    All other components within normal limits  TROPONIN I (HIGH SENSITIVITY) - Abnormal; Notable for the following components:   Troponin I (High Sensitivity) 330 (*)    All other components within normal limits  TROPONIN I (HIGH SENSITIVITY) - Abnormal; Notable for the following components:   Troponin I (High Sensitivity) 252 (*)    All other components within normal limits  BLOOD GAS, VENOUS       Imaging Studies ordered: I ordered imaging studies including chest  x-ray I independently visualized and interpreted imaging. I agree with the radiologist interpretation   Medicines ordered and prescription drug management: Meds ordered this encounter  Medications   furosemide (LASIX) injection 40 mg   aspirin tablet 325 mg   DISCONTD: furosemide (LASIX) injection 40 mg   furosemide (LASIX) 10 MG/ML injection    Muse, Jada D: cabinet override   furosemide (LASIX) injection 40 mg    -I have reviewed the patients home medicines and have made adjustments as needed  Critical interventions Diuresis, aspirin, cardiology consultation, BiPAP, oxygen supplementation  Consultations Obtained: I requested consultation with the cardiologist Dr. Domenic Polite,  and discussed lab and imaging findings as well as pertinent plan - they recommend: Stabilization of respiratory status, trending of troponins   Cardiac Monitoring: The patient was maintained on a cardiac monitor.  I personally viewed and interpreted the cardiac monitored which showed an underlying rhythm of: NSR  Social Determinants of Health:  Factors impacting patients care include: none   Reevaluation: After the interventions noted above, I reevaluated the patient and found that they have :improved  Co morbidities that complicate the patient evaluation  Past Medical History:  Diagnosis Date   Coronary artery disease    Severe multivessel disease with unsuccessful PCI attempt and medical management 07/2020   HFrEF (heart failure with reduced ejection fraction) ([place])    Hyperlipidemia    Hypertension    Ischemic cardiomyopathy    Noncompliance    NSVT (nonsustained ventricular tachycardia) ([place])    ST elevation myocardial infarction (STEMI) of inferior wall ([place])       Dispostion: I considered admission for this patient, and disposition pending VBG, delta troponin and reevaluation after BiPAP initiation.  Please see provider signout for continuation of work-up.  Patient will require  hospitalization, exact level  of care is pending.     Final Clinical Impression(s) / ED Diagnoses Final diagnoses:  Cardiac volume overload  Elevated bilirubin     _0 @    Teressa Lower, MD 03/15/22 2123

## 2022-03-16 ENCOUNTER — Inpatient Hospital Stay (HOSPITAL_COMMUNITY): Payer: Medicare HMO

## 2022-03-16 ENCOUNTER — Other Ambulatory Visit (HOSPITAL_COMMUNITY): Payer: Self-pay | Admitting: *Deleted

## 2022-03-16 DIAGNOSIS — I5043 Acute on chronic combined systolic (congestive) and diastolic (congestive) heart failure: Secondary | ICD-10-CM

## 2022-03-16 DIAGNOSIS — I255 Ischemic cardiomyopathy: Secondary | ICD-10-CM

## 2022-03-16 DIAGNOSIS — E785 Hyperlipidemia, unspecified: Secondary | ICD-10-CM

## 2022-03-16 DIAGNOSIS — I2510 Atherosclerotic heart disease of native coronary artery without angina pectoris: Secondary | ICD-10-CM

## 2022-03-16 LAB — ECHOCARDIOGRAM COMPLETE
AR max vel: 2.31 cm2
AV Area VTI: 1.67 cm2
AV Area mean vel: 1.92 cm2
AV Mean grad: 3 mmHg
AV Peak grad: 5.3 mmHg
Ao pk vel: 1.15 m/s
Area-P 1/2: 5.62 cm2
Height: 60 in
MV VTI: 1.15 cm2
S' Lateral: 5.8 cm
Weight: 2737.23 oz

## 2022-03-16 LAB — COMPREHENSIVE METABOLIC PANEL
ALT: 20 U/L (ref 0–44)
AST: 43 U/L — ABNORMAL HIGH (ref 15–41)
Albumin: 2.9 g/dL — ABNORMAL LOW (ref 3.5–5.0)
Alkaline Phosphatase: 188 U/L — ABNORMAL HIGH (ref 38–126)
Anion gap: 10 (ref 5–15)
BUN: 43 mg/dL — ABNORMAL HIGH (ref 8–23)
CO2: 26 mmol/L (ref 22–32)
Calcium: 9.1 mg/dL (ref 8.9–10.3)
Chloride: 97 mmol/L — ABNORMAL LOW (ref 98–111)
Creatinine, Ser: 1.38 mg/dL — ABNORMAL HIGH (ref 0.44–1.00)
GFR, Estimated: 42 mL/min — ABNORMAL LOW (ref >60)
Glucose, Bld: 111 mg/dL — ABNORMAL HIGH (ref 70–99)
Potassium: 4.5 mmol/L (ref 3.5–5.1)
Sodium: 133 mmol/L — ABNORMAL LOW (ref 135–145)
Total Bilirubin: 4.9 mg/dL — ABNORMAL HIGH (ref 0.3–1.2)
Total Protein: 7.3 g/dL (ref 6.5–8.1)

## 2022-03-16 LAB — HIV ANTIBODY (ROUTINE TESTING W REFLEX): HIV Screen 4th Generation wRfx: NONREACTIVE

## 2022-03-16 LAB — CBC
HCT: 38.5 % (ref 36.0–46.0)
Hemoglobin: 12.9 g/dL (ref 12.0–15.0)
MCH: 33 pg (ref 26.0–34.0)
MCHC: 33.5 g/dL (ref 30.0–36.0)
MCV: 98.5 fL (ref 80.0–100.0)
Platelets: 173 10*3/uL (ref 150–400)
RBC: 3.91 MIL/uL (ref 3.87–5.11)
RDW: 19.9 % — ABNORMAL HIGH (ref 11.5–15.5)
WBC: 8.5 10*3/uL (ref 4.0–10.5)
nRBC: 0 % (ref 0.0–0.2)

## 2022-03-16 MED ORDER — FUROSEMIDE 10 MG/ML IJ SOLN
80.0000 mg | Freq: Once | INTRAMUSCULAR | Status: AC
  Administered 2022-03-16: 80 mg via INTRAVENOUS
  Filled 2022-03-16: qty 8

## 2022-03-16 MED ORDER — PERFLUTREN LIPID MICROSPHERE
1.0000 mL | INTRAVENOUS | Status: AC | PRN
  Administered 2022-03-16: 5 mL via INTRAVENOUS

## 2022-03-16 MED ORDER — FUROSEMIDE 10 MG/ML IJ SOLN
15.0000 mg/h | INTRAVENOUS | Status: DC
  Administered 2022-03-16 – 2022-03-17 (×2): 10 mg/h via INTRAVENOUS
  Administered 2022-03-19 – 2022-03-20 (×3): 15 mg/h via INTRAVENOUS
  Filled 2022-03-16 (×9): qty 20

## 2022-03-16 MED ORDER — ENOXAPARIN SODIUM 40 MG/0.4ML IJ SOSY: 40.0000 mg | PREFILLED_SYRINGE | INTRAMUSCULAR | Status: DC

## 2022-03-16 NOTE — Consult Note (Signed)
WOC Nurse Consult Note: Reason for Consult:Stage 2 pressure injury to medial buttocks (POA) and partial thickness, Stage 2 PI to left IT. Bilateral LEs with weeping Wound type:pressure, moisture, venous insufficiency Pressure Injury POA: Yes Measurement: Buttock:  6cm x 2cm x 0.1cm Left IT: 2cm x 1.3cm x 0.1cm Wound CVE:LFYBOFB and IT, pink, moist Drainage (amount, consistency, odor) small serous Periwound:intact  Dressing procedure/placement/frequency: I have provided guidance for Nursing in the care of the buttock and IT wounds using a xeroform gauze as a wound contact layer topped with silicone foam. Turning and repositioning is in place and Nursing is to minimize time in the supine position. A mattress replacement with low air loss feature is provided for microclimate management as well as pressure redistribution as patient often requires the seated position for air exchange, increasing pressure on the buttocks and ischial tuberosities. The bilateral LEs will be placed into Prevalon boots for floatation of heels and correction of lateral rotation. When patient is OOB to the chair, a pressure redistribution chair cushion is provided.  WOC nursing team will not follow, but will remain available to this patient, the nursing and medical teams.  Please re-consult if needed.  Thank you for inviting [place] to participate in this patient's Plan of Care.  Ladona Mow, MSN, RN, CNS, GNP, Leda Min, [employer], [employer] phone:  [phone]

## 2022-03-16 NOTE — Progress Notes (Signed)
Reds Vest attempted by this Clinical research associate and RN present with low quality readings x2.

## 2022-03-16 NOTE — Progress Notes (Signed)
  Transition of Care (TOC) Screening Note   Patient Details  Name: Elizabeth Herring Date of Birth: 1955-06-26   Transition of Care [facility]) CM/SW Contact:    Annice Needy, LCSW Phone Number: 03/16/2022, 1:09 PM    Transition of Care Department [facility]) has reviewed patient and no TOC needs have been identified at this time. We will continue to monitor patient advancement through interdisciplinary progression rounds. If new patient transition needs arise, please place a TOC consult.

## 2022-03-16 NOTE — Progress Notes (Signed)
*  PRELIMINARY RESULTS* Echocardiogram 2D Echocardiogram has been performed.  Carolyne Fiscal 03/16/2022, 10:33 AM

## 2022-03-16 NOTE — Plan of Care (Signed)
  Problem: Clinical Measurements: Goal: Cardiovascular complication will be avoided Outcome: Not Progressing   Problem: Clinical Measurements: Goal: Respiratory complications will improve Outcome: Not Progressing   Problem: Pain Managment: Goal: General experience of comfort will improve Outcome: Not Progressing   Problem: Skin Integrity: Goal: Risk for impaired skin integrity will decrease Outcome: Not Progressing

## 2022-03-16 NOTE — Hospital Course (Addendum)
Elizabeth Herring is a 66 y.o. 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  Patient denies chest pain or shortness of breath at this time.  Denies nausea vomiting or diarrhea.  Denies abdominal pain.   ED events: Patient was found significantly volume overload with extensive bilateral lower extremity edema to the thigh area, BNP > 4500, troponin elevated 330, 452.  Cardiology was consulted, patient started on IV Lasix.  Also found to have elevated alk phos 219, AST 60, ALT 26, total bilirubin 5.0   Initially BiPAP was applied, BiPAP has been weaned off.  Currently on room air.   Assessment and plan:   Acute on chronic systolic and diastolic HF with exacerbation/  ICM LVEF < 20%  -Continuing shortness of breath at rest -satting 95% on 2 L oxygen  -Repeating echo: <20%. The left  ventricle has severely decreased function.,  Reduced right LV systolic, chronic 0.6 x 0.4 cm mobile echodensity attached to the pulmonic  valve leaflet with motion pattern independent of the pulmonic valve. There  is mild to moderate pulmonary valve regurgitation.   We will continue..   -Per Cardiology 11/8 >>initiating 80 mg IV Lasix, >>> On  Lasix drip 10 mg/hr  I/O last 3 completed shifts: In: 1436.3 [P.O.:1080; I.V.:156.3; IV Piggyback:200] Out: 4100 [Urine:4100] No intake/output data recorded.   Intake/Output Summary (Last 24 hours) at 03/19/2022 1213 Last data filed at 03/19/2022 0517 Gross per 24 hour  Intake 956.3 ml  Output 600 ml  Net 356.3 ml   Forker's care to help   -Monitoring Reds Clip , BMP, daily weights, I's and O's -Hold beta-blockers -Hold  ACEi/ARB/ARNI/MRA - Continue Lisabeth Register on hold as per cardiology recommendation.   Filed Weights   03/15/22 1147 03/15/22 2201 03/19/22 0519  Weight: 63.5 kg 77.6 kg 74.8 kg      -Patient not a candidate for ICD due to GDMT noncompliance, per EP  Volume overload-bilateral lower extremity edema -secondary to above.  She also has open areas in lower extremities.  -  wound care consult for wound care management of lower extremity wounds  -Continue Lasix drip  Multivessel CAD  -Elevated troponin-likely ischemic demand -Continue aspirin 81 mg and Plavix 75 mg once daily, indefinitely -Continue atorvastatin 80 mg nightly   HLD -Continue atorvastatin 80 mg nightly -Outpatient management   Transaminitis-patient has transaminitis with increased bilirubin.  We will obtain abdominal ultrasound.  CKD stage IIIb -monitoring, improving  Lab Results  Component Value Date   CREATININE 1.21 (H) 03/19/2022   CREATININE 1.24 (H) 03/18/2022   CREATININE 1.44 (H) 03/17/2022   -creatinine 1.58, slightly above baseline of 1.21 as of March 2023.[name]  Entresto on hold.    Hypoalbuminemia: Repleting albumin IV

## 2022-03-16 NOTE — Progress Notes (Signed)
Progress Note  Patient Name: Elizabeth Herring Date of Encounter: 03/16/2022  Primary Cardiologist: Julien Nordmann, MD  Subjective   No acute events overnight. Patient did not get IV Lasix in the last 24 hours. She put out 700 mL so far.  Inpatient Medications    Scheduled Meds:  aspirin EC  81 mg Oral Daily   atorvastatin  80 mg Oral Daily   clopidogrel  75 mg Oral Daily   dapagliflozin propanediol  10 mg Oral Q breakfast   furosemide  40 mg Intravenous Q12H   montelukast  10 mg Oral Daily   pantoprazole  40 mg Oral Daily   Continuous Infusions:  PRN Meds: acetaminophen **OR** acetaminophen, ondansetron **OR** ondansetron (ZOFRAN) IV   Vital Signs    Vitals:   03/15/22 2201 03/15/22 2214 03/16/22 0513 03/16/22 0826  BP: 131/77  98/75 105/79  Pulse: 85  89 95  Resp: 20  19 18   Temp: 97.8 F (36.6 C)  97.7 F (36.5 C) 98.1 F (36.7 C)  TempSrc:    Axillary  SpO2: 99% 99% 95% 93%  Weight: 77.6 kg     Height:        Intake/Output Summary (Last 24 hours) at 03/16/2022 0949 Last data filed at 03/16/2022 0500 Gross per 24 hour  Intake 337 ml  Output 700 ml  Net -363 ml   Filed Weights   03/15/22 1147 03/15/22 2201  Weight: 63.5 kg 77.6 kg    Telemetry     Personally reviewed, HR controlled  ECG    An ECG dated 03/15/22 was personally reviewed today and demonstrated:  NSR and RBBB  Physical Exam   GEN: No acute distress.   Neck: JVD+ Cardiac: RRR, no murmur, rub, or gallop.  Respiratory: Labored.  GI: Soft, nontender, bowel sounds present. MS: Anasarca + Neuro:  Nonfocal. Psych: Alert and oriented x 3. Normal affect.  Labs    Chemistry Recent Labs  Lab 03/15/22 1307 03/16/22 0428  NA 133* 133*  K 5.2* 4.5  CL 96* 97*  CO2 24 26  GLUCOSE 87 111*  BUN 44* 43*  CREATININE 1.58* 1.38*  CALCIUM 9.6 9.1  PROT 8.6* 7.3  ALBUMIN 3.6 2.9*  AST 60* 43*  ALT 26 20  ALKPHOS 219* 188*  BILITOT 5.0* 4.9*  GFRNONAA 36* 42*  ANIONGAP 13  10     Hematology Recent Labs  Lab 03/15/22 1307 03/16/22 0428  WBC 7.8 8.5  RBC 4.12 3.91  HGB 13.6 12.9  HCT 41.3 38.5  MCV 100.2* 98.5  MCH 33.0 33.0  MCHC 32.9 33.5  RDW 20.2* 19.9*  PLT 185 173    Cardiac Enzymes Recent Labs  Lab 03/15/22 1307 03/15/22 1513  TROPONINIHS 330* 252*    BNP Recent Labs  Lab 03/15/22 1307  BNP >4,500.0*     DDimerNo results for input(s): "DDIMER" in the last 168 hours.   Radiology    13/07/23 Abdomen Limited  Result Date: 03/16/2022 CLINICAL DATA:  Elevated bilirubin EXAM: ULTRASOUND ABDOMEN LIMITED RIGHT UPPER QUADRANT COMPARISON:  None Available. FINDINGS: Gallbladder: No visible gallstones. Small amount of sludge in the gallbladder. There is regional ascites. Patient unable to communicate for determination of Murphy sign. Common bile duct: Diameter: No evidence of intrahepatic ductal dilatation. Common duct not well seen because of bowel gas and ascites. Liver: Overall normal echogenicity. Mild nodularity of the surface suggesting cirrhosis. No focal lesion. Portal vein is patent on color Doppler imaging with normal direction of blood flow  towards the liver. Other: None. IMPRESSION: 1. Mild nodularity of the liver surface suggesting cirrhosis. No focal lesion. 2. Small amount of sludge in the gallbladder. No visible gallstones. 3. Perihepatic ascites. 4. Common duct not well seen because of bowel gas and ascites. No evidence of intrahepatic ductal dilatation. Electronically Signed   By: Nelson Chimes M.D.   On: 03/16/2022 09:05   DG Chest Port 1 View  Result Date: 03/15/2022 CLINICAL DATA:  Shortness of breath, BILATERAL lower extremity swelling for 2-3 days, history CHF EXAM: PORTABLE CHEST 1 VIEW COMPARISON:  Portable exam 1223 hours compared to 07/15/2020 FINDINGS: Enlargement of cardiac silhouette with pulmonary vascular congestion. Atherosclerotic calcification aorta. Bibasilar effusions and atelectasis. No definite infiltrate or edema.  No pneumothorax. Skin folds project over LEFT chest. Bones demineralized. IMPRESSION: Enlargement of cardiac silhouette with slight pulmonary vascular congestion. Bibasilar pleural effusions and atelectasis. Aortic Atherosclerosis (ICD10-I70.0). Electronically Signed   By: Lavonia Dana M.D.   On: 03/15/2022 12:33    Cardiac Studies  Echo on 03/16/2022 LVEF <20%  [facility] in 2022 Prox RCA lesion is 100% stenosed. CTO Mid LM lesion is 25% stenosed. Dist LAD lesion is 95% stenosed. Prox LAD to Dist LAD lesion is 75% stenosed. Dist LM to Prox LAD lesion is 50% stenosed. Prox Cx to Mid Cx lesion is 100% stenosed. In-stent thrombosis of the stent IRA TIMI 0 flow Moderate collaterals left to right left to left Left ventricular function severely depressed left ventricular enlargement EF between 25 and 30% globally Unsuccessful attempted PCI and stent to ostial old stent to the circumflex with in-stent thrombosis failure to cross with a wire  Assessment & Plan    Patient is a 66 year old F known to have multivessel CAD (RCA CTO, LAD 75 to 95%, LCx ISR CTO with moderate collaterals L to R and L to L) with LVEF less than 20% and no device (due to GDMT noncompliance, per EP), ICM LVEF less than 20% with no device, HLD was admitted to the hospitalist team for the management of ADHF.  #Acute systolic and diastolic heart failure exacerbation #ICM LVEF less than 20% with no device Plan -Administer IV Lasix 80 mg 1 dose now followed by Lasix drip at 10 mg/h -Hold beta-blockers -Hold ACEi/ARB/ARNI/MRA -Patient will be reintroduced to GDMT after she achieves near euvolemic status -Daily weights, ins and outs, 1.2 L fluid restriction -Patient not a candidate for ICD due to GDMT noncompliance, per EP -Consult palliative care due to chronic medication noncompliance and frequent admissions to the hospital  # Multivessel CAD (RCA CTO, LAD 75 to 95%, LCx ISR CTO with moderate collaterals L to R and L to L)   Plan -Continue aspirin 81 mg and Plavix 75 mg once daily, indefinitely -Continue atorvastatin 80 mg nightly  #HLD Plan -Continue atorvastatin 80 mg nightly -Outpatient management  I have spent a total of 37 minutes with patient reviewing chart , telemetry, EKGs, labs and examining patient as well as establishing an assessment and plan that was discussed with the patient.  > 50% of time was spent in direct patient care.      Signed, Chalmers Guest, MD  03/16/2022, 9:49 AM

## 2022-03-16 NOTE — Progress Notes (Signed)
PROGRESS NOTE    Patient: Elizabeth Herring                            PCP: Pcp, No                    DOB: [birth year]            DOA: 03/15/2022 TSV:[id number]             DOS: 03/16/2022, 2:39 PM   LOS: 1 day   Date of Service: The patient was seen and examined on 03/16/2022  Subjective:   The patient was seen and examined this morning. Hemodynamically stable but complaining of shortness of breath, denies any chest pain, complaining of significant lower extremity swelling.   Brief Narrative:   Elizabeth Herring is a 66 y.o. 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  Patient denies chest pain or shortness of breath at this time.  Denies nausea vomiting or diarrhea.  Denies abdominal pain.   ED events: Patient was found significantly volume overload with extensive bilateral lower extremity edema to the thigh area, BNP > 4500, troponin elevated 330, 452.  Cardiology was consulted, patient started on IV Lasix.  Also found to have elevated alk phos 219, AST 60, ALT 26, total bilirubin 5.0   Initially BiPAP was applied, BiPAP has been weaned off.  Currently on room air.     Acute on chronic systolic and diastolic HF with exacerbation/  ICM LVEF < 20%  -Repeating echo -Cardiology initiating 80 mg IV Lasix, denies Lasix drip 10 mg/hr  -Monitoring Reds Clip , BMP, daily weights, I's and O's -Hold beta-blockers -Hold ACEi/ARB/ARNI/MRA - Continue Farxiga.  Entresto on hold as per cardiology recommendation.  Filed Weights   03/15/22 1147 03/15/22 2201  Weight: 63.5 kg 77.6 kg    -Patient not a candidate for ICD due to GDMT noncompliance, per  EP  Volume overload-bilateral lower extremity edema -secondary to above.  She also has open areas in lower extremities.  -  wound care consult for wound care management of lower extremity wounds   Multivessel CAD  -Elevated troponin-likely ischemic demand -Continue aspirin 81 mg and Plavix 75 mg once daily, indefinitely -Continue atorvastatin 80 mg nightly   HLD -Continue atorvastatin 80 mg nightly -Outpatient management   Transaminitis-patient has transaminitis with increased bilirubin.  We will obtain abdominal ultrasound.  CKD stage IIIb Lab Results  Component Value Date   CREATININE 1.38 (H) 03/16/2022   CREATININE 1.58 (H) 03/15/2022   CREATININE 1.21 (H) 08/04/2021    -creatinine 1.58, slightly above baseline of 1.21 as of March 2023.[name]  Entresto on hold.     Skin Assessment: I have examined the patient's skin and I agree with the wound assessment as performed by wound care team As outlined belowe: Pressure Injury 03/15/22 Buttocks Medial Stage 2 -  Partial thickness loss of dermis presenting as a shallow open injury with a red, pink wound bed without slough. healing st 2 (Active)  03/15/22 2215  Location: Buttocks  Location Orientation: Medial  Staging: Stage 2 -  Partial thickness loss of dermis presenting as a shallow open injury with a red,  pink wound bed without slough.  Wound Description (Comments): healing st 2  Present on Admission: Yes  Dressing Type Foam - Lift dressing to assess site every shift 03/15/22 2214     ------------------------------------------------------------------------------------------------------------------------------------------------  DVT prophylaxis:   SCDs  Code Status:   Code Status: Full Code  Family Communication: No family member present at bedside- attempt will be made to update daily The above findings and plan of care has been discussed with patient (and family)  in detail,  they expressed understanding and agreement  of above. -Advance care planning has been discussed.   Admission status:   Status is: Inpatient Remains inpatient appropriate because: Needing aggressive IV medication for diuresis due to significant volume overload     Procedures:   No admission procedures for hospital encounter.   Antimicrobials:  Anti-infectives (From admission, onward)    None        Medication:   aspirin EC  81 mg Oral Daily   atorvastatin  80 mg Oral Daily   clopidogrel  75 mg Oral Daily   dapagliflozin propanediol  10 mg Oral Q breakfast   furosemide  80 mg Intravenous Once   [START ON 03/17/2022] montelukast  10 mg Oral Daily   pantoprazole  40 mg Oral Daily    acetaminophen **OR** acetaminophen, ondansetron **OR** ondansetron (ZOFRAN) IV   Objective:   Vitals:   03/15/22 2214 03/16/22 0513 03/16/22 0826 03/16/22 1400  BP:  98/75 105/79 110/82  Pulse:  89 95 88  Resp:  _0 Temp:  97.7 F (36.5 C) 98.1 F (36.7 C) 98 F (36.7 C)  TempSrc:   Axillary Oral  SpO2: 99% 95% 93% 94%  Weight:      Height:        Intake/Output Summary (Last 24 hours) at 03/16/2022 1439 Last data filed at 03/16/2022 0500 Gross per 24 hour  Intake 337 ml  Output 700 ml  Net -363 ml   Filed Weights   03/15/22 1147 03/15/22 2201  Weight: 63.5 kg 77.6 kg     Examination:   Physical Exam  Constitution: Shortness of breath alert, cooperative, no distress,  Appears calm and comfortable  Psychiatric:   Normal and stable mood and affect, cognition intact,   HEENT:        Normocephalic, PERRL, otherwise with in Normal limits  Chest:         Chest symmetric Cardio vascular:  S1/S2, RRR, No murmure, No Rubs or Gallops  pulmonary: Clear to auscultation bilaterally, respirations unlabored, negative wheezes /bilateral lower lobe crackles Abdomen: Soft, non-tender, non-distended, bowel sounds,no masses, no organomegaly Muscular skeletal: Limited exam - in bed, able to move all 4 extremities,   Neuro:  CNII-XII intact. , normal motor and sensation, reflexes intact  Extremities: ++4 pitting edema lower extremities extending to the thigh area, +2 pulses  Skin: Dry, warm to touch, negative for any Rashes, No open wounds Wounds: per nursing documentation   ------------------------------------------------------------------------------------------------------------------------------------------    LABs:     Latest Ref Rng & Units 03/16/2022    4:28 AM 03/15/2022    1:07 PM 08/04/2021   12:56 PM  CBC  WBC 4.0 - 10.5 K/uL 8.5  7.8  4.3   Hemoglobin 12.0 - 15.0 g/dL 12.9  13.6  9.5   Hematocrit 36.0 - 46.0 % 38.5  41.3  28.2   Platelets 150 - 400 K/uL 173  185  174       Latest Ref Rng & Units 03/16/2022  4:28 AM 03/15/2022    1:07 PM 08/04/2021    4:44 AM  CMP  Glucose 70 - 99 mg/dL 111  87  113   BUN 8 - 23 mg/dL 43  44  20   Creatinine 0.44 - 1.00 mg/dL 1.38  1.58  1.21   Sodium 135 - 145 mmol/L 133  133  137   Potassium 3.5 - 5.1 mmol/L 4.5  5.2  3.3   Chloride 98 - 111 mmol/L 97  96  102   CO2 22 - 32 mmol/L _0 Calcium 8.9 - 10.3 mg/dL 9.1  9.6  9.3   Total Protein 6.5 - 8.1 g/dL 7.3  8.6    Total Bilirubin 0.3 - 1.2 mg/dL 4.9  5.0    Alkaline Phos 38 - 126 U/L 188  219    AST 15 - 41 U/L 43  60    ALT 0 - 44 U/L 20  26         Micro Results No results found for this or any previous visit (from the past 240 hour(s)).  Radiology Reports ECHOCARDIOGRAM COMPLETE  Result Date: 03/16/2022    ECHOCARDIOGRAM REPORT   Patient Name:   OMAH DEWALT Date of Exam: 03/16/2022 Medical Rec #:  [id number]             Height:       60.0 in Accession #:    [id number]            Weight:       171.1 lb Date of Birth:  11-Jun-1955             BSA:          1.747 m Patient Age:    4 years              BP:           98/75 mmHg Patient Gender: F                     HR:           99 bpm. Exam Location:  [facility] Procedure: 2D Echo, Cardiac Doppler, Color Doppler and  Intracardiac            Opacification Agent Indications:    Cardiomyopathy-Ischemic  History:        Patient has prior history of Echocardiogram examinations, most                 recent 02/11/2021. Cardiomyopathy and CHF, Previous Myocardial                 Infarction; Risk Factors:Hypertension, Current Smoker and                 Dyslipidemia.  Sonographer:    Wenda Low Referring Phys: Elliott  1. Left ventricular ejection fraction, by estimation, is <20%. The left ventricle has severely decreased function. The left ventricle has no regional wall motion abnormalities. The left ventricular internal cavity size was mildly dilated. Left ventricular diastolic parameters are consistent with Grade I diastolic dysfunction (impaired relaxation). There is the interventricular septum is flattened in diastole ('D' shaped left ventricle), consistent with right ventricular volume overload. No evidence of LV thrombus.  2. Right ventricular systolic function is moderately reduced. The right ventricular size is mildly enlarged. Moderately increased right ventricular wall thickness. Unable to calculate PASP due to severe TR.  3. The tricuspid  valve is abnormal. Tricuspid valve regurgitation is severe.  4. There is a 0.6 x 0.4 cm mobile echodensity attached to the pulmonic valve leaflet with motion pattern independent of the pulmonic valve. There is mild to moderate pulmonary valve regurgitation. Differential include fibroelastoma, calcified valve, vegetation, thrombus. Clinical correlation is recommended.  5. The inferior vena cava is dilated in size with <50% respiratory variability, suggesting right atrial pressure of 15 mmHg.  6. Large pleural effusion in both left and right lateral regions. Comparison(s): The mobile echodensity on pulmonic valve was present on prior Echo from 02/2021. FINDINGS  Left Ventricle: Left ventricular ejection fraction, by estimation, is <20%. The left ventricle has severely  decreased function. The left ventricle has no regional wall motion abnormalities. Definity contrast agent was given IV to delineate the left ventricular endocardial borders. The left ventricular internal cavity size was mildly dilated. There is no left ventricular hypertrophy. The interventricular septum is flattened in diastole ('D' shaped left ventricle), consistent with right ventricular volume overload. Left ventricular diastolic parameters are consistent with Grade I diastolic dysfunction (impaired relaxation).  LV Wall Scoring: There is diffuse hypokinesis. Right Ventricle: The right ventricular size is mildly enlarged. Moderately increased right ventricular wall thickness. Right ventricular systolic function is moderately reduced. The tricuspid regurgitant velocity is 1.96 m/s, and with an assumed right atrial pressure of 15 mmHg, the estimated right ventricular systolic pressure is 07.3 mmHg. Left Atrium: Left atrial size was mildly dilated. Right Atrium: Right atrial size was severely dilated. Pericardium: There is no evidence of pericardial effusion. Mitral Valve: The mitral valve is abnormal. Severely decreased mobility of the mitral valve leaflets. Trivial mitral valve regurgitation. No evidence of mitral valve stenosis. MV peak gradient, 8.9 mmHg. The mean mitral valve gradient is 4.0 mmHg. Tricuspid Valve: The tricuspid valve is abnormal. Tricuspid valve regurgitation is severe. No evidence of tricuspid stenosis. Aortic Valve: The aortic valve is tricuspid. Aortic valve regurgitation is not visualized. No aortic stenosis is present. Aortic valve mean gradient measures 3.0 mmHg. Aortic valve peak gradient measures 5.3 mmHg. Aortic valve area, by VTI measures 1.67 cm. Pulmonic Valve: There is a 0.6 x 0.4 cm mobile echodensity attached to the pulmonic valve leaflet with motion pattern independent of the pulmonic valve. Differential include fibroelastoma, calcified valve, vegetation, thrombus. Clinical  correlation is recommended. Pulmonic valve regurgitation is mild to moderate. No evidence of pulmonic stenosis. Aorta: The aortic root is normal in size and structure. Venous: The inferior vena cava is dilated in size with less than 50% respiratory variability, suggesting right atrial pressure of 15 mmHg. IAS/Shunts: There is left bowing of the interatrial septum, suggestive of elevated right atrial pressure. No atrial level shunt detected by color flow Doppler. Additional Comments: There is a large pleural effusion in both left and right lateral regions. Mild ascites is present.  LEFT VENTRICLE PLAX 2D LVIDd:         6.00 cm LVIDs:         5.80 cm LV PW:         1.00 cm LV IVS:        1.00 cm LVOT diam:     1.90 cm LV SV:         32 LV SV Index:   18 LVOT Area:     2.84 cm  LV Volumes (MOD) LV vol d, MOD A4C: 112.0 ml LV SV MOD A4C:     112.0 ml RIGHT VENTRICLE RV Basal diam:  3.65 cm RV Mid diam:  3.40 cm RV S prime:     8.15 cm/s TAPSE (M-mode): 1.5 cm LEFT ATRIUM             Index        RIGHT ATRIUM           Index LA diam:        4.60 cm 2.63 cm/m   RA Area:     31.90 cm LA Vol (A2C):   77.0 ml 44.08 ml/m  RA Volume:   124.00 ml 70.99 ml/m LA Vol (A4C):   63.8 ml 36.53 ml/m LA Biplane Vol: 69.5 ml 39.79 ml/m  AORTIC VALVE                    PULMONIC VALVE AV Area (Vmax):    2.31 cm     PV Vmax:       1.05 m/s AV Area (Vmean):   1.92 cm     PV Peak grad:  4.4 mmHg AV Area (VTI):     1.67 cm AV Vmax:           115.00 cm/s AV Vmean:          85.400 cm/s AV VTI:            0.192 m AV Peak Grad:      5.3 mmHg AV Mean Grad:      3.0 mmHg LVOT Vmax:         93.80 cm/s LVOT Vmean:        57.800 cm/s LVOT VTI:          0.113 m LVOT/AV VTI ratio: 0.59  AORTA Ao Root diam: 2.60 cm MITRAL VALVE                TRICUSPID VALVE MV Area (PHT): 5.62 cm     TR Peak grad:   15.4 mmHg MV Area VTI:   1.15 cm     TR Vmax:        196.00 cm/s MV Peak grad:  8.9 mmHg MV Mean grad:  4.0 mmHg     SHUNTS MV Vmax:       1.49  m/s     Systemic VTI:  0.11 m MV Vmean:      88.9 cm/s    Systemic Diam: 1.90 cm MV Decel Time: 135 msec MV E velocity: 84.40 cm/s MV A velocity: 126.00 cm/s MV E/A ratio:  0.67 Vishnu Priya Mallipeddi Electronically signed by Lorelee Cover Mallipeddi Signature Date/Time: 03/16/2022/2:35:24 PM    Final    US Abdomen Limited  Result Date: 03/16/2022 CLINICAL DATA:  Elevated bilirubin EXAM: ULTRASOUND ABDOMEN LIMITED RIGHT UPPER QUADRANT COMPARISON:  None Available. FINDINGS: Gallbladder: No visible gallstones. Small amount of sludge in the gallbladder. There is regional ascites. Patient unable to communicate for determination of Murphy sign. Common bile duct: Diameter: No evidence of intrahepatic ductal dilatation. Common duct not well seen because of bowel gas and ascites. Liver: Overall normal echogenicity. Mild nodularity of the surface suggesting cirrhosis. No focal lesion. Portal vein is patent on color Doppler imaging with normal direction of blood flow towards the liver. Other: None. IMPRESSION: 1. Mild nodularity of the liver surface suggesting cirrhosis. No focal lesion. 2. Small amount of sludge in the gallbladder. No visible gallstones. 3. Perihepatic ascites. 4. Common duct not well seen because of bowel gas and ascites. No evidence of intrahepatic ductal dilatation. Electronically Signed   By: Nelson Chimes M.D.   On: 03/16/2022 09:05  SIGNED: Deatra James, MD, FHM. [facility],  Pager (please use [email or web address] to page/text) Please use Epic Secure Chat for non-urgent communication (7AM-7PM)  If 7PM-7AM, please contact night-coverage [email or web address], 03/16/2022, 2:39 PM

## 2022-03-16 NOTE — NC FL2 (Signed)
[place] MEDICAID FL2 LEVEL OF CARE SCREENING TOOL     IDENTIFICATION  Patient Name: Elizabeth Herring Birthdate: Oct 16, 1955 Sex: female Admission Date (Current Location): 03/15/2022  [facility] and [place] Number:  [employer] and Address:  [place] [address], [place]      Provider Number: [id number]  Attending Physician Name and Address:  Deatra James, MD  Relative Name and Phone Number:  [facility] [phone]    Current Level of Care: Hospital Recommended Level of Care: [place] Prior Approval Number:    Date Approved/Denied:   PASRR Number: [id number]  Discharge Plan: SNF    Current Diagnoses: Patient Active Problem List   Diagnosis Date Noted   Acute exacerbation of CHF (congestive heart failure) ([place]) 03/15/2022   Acute upper gastrointestinal bleeding 08/02/2021   Acute blood loss anemia 08/02/2021   Generalized weakness 08/02/2021   Chronic combined systolic and diastolic heart failure ([place]) 08/02/2021   Hyperlipidemia 08/02/2021   Acute lower GI bleeding 08/01/2021   STEMI (ST elevation myocardial infarction) ([place]) 07/18/2020   Ischemic cardiomyopathy 07/18/2020   Smoker 07/18/2020   Pneumonia 07/15/2020   PNA (pneumonia) 07/14/2020   Bronchitis due to tobacco use 07/14/2020   Essential hypertension 07/14/2020    Orientation RESPIRATION BLADDER Height & Weight     Self, Time, Situation, Place  Normal Continent Weight: 171 lb 1.2 oz (77.6 kg) Height:  5' (152.4 cm)  BEHAVIORAL SYMPTOMS/MOOD NEUROLOGICAL BOWEL NUTRITION STATUS      Continent Diet (heart healthy)  AMBULATORY STATUS COMMUNICATION OF NEEDS Skin   Total Care Verbally PU Stage and Appropriate Care (stage 2 buttocks, medial. Open wound thigh, left, posterior, proximal)                       Personal Care Assistance Level of Assistance  Bathing, Feeding, Dressing Bathing Assistance: Limited  assistance Feeding assistance: Independent Dressing Assistance: Limited assistance     Functional Limitations Info  Sight, Hearing, Speech Sight Info: Adequate Hearing Info: Adequate Speech Info: Adequate    SPECIAL CARE FACTORS FREQUENCY  PT (By licensed PT)     PT Frequency: 5x/week              Contractures Contractures Info: Not present    Additional Factors Info  Code Status, Allergies Code Status Info: Full Code Allergies Info: Chlorhexidine           Current Medications (03/16/2022):  This is the current hospital active medication list Current Facility-Administered Medications  Medication Dose Route Frequency Provider Last Rate Last Admin   acetaminophen (TYLENOL) tablet 650 mg  650 mg Oral Q6H PRN Oswald Hillock, MD       Or   acetaminophen (TYLENOL) suppository 650 mg  650 mg Rectal Q6H PRN Oswald Hillock, MD       aspirin EC tablet 81 mg  81 mg Oral Daily Oswald Hillock, MD   81 mg at 03/16/22 1035   atorvastatin (LIPITOR) tablet 80 mg  80 mg Oral Daily Oswald Hillock, MD   80 mg at 03/16/22 1329   clopidogrel (PLAVIX) tablet 75 mg  75 mg Oral Daily Oswald Hillock, MD   75 mg at 03/16/22 1035   dapagliflozin propanediol (FARXIGA) tablet 10 mg  10 mg Oral Q breakfast Oswald Hillock, MD   10 mg at 03/16/22 0809   furosemide (LASIX) 200 mg in dextrose 5 % 100 mL (2  mg/mL) infusion  10 mg/hr Intravenous Continuous Ellsworth Lennox, PA-C 5 mL/hr at 03/16/22 1513 10 mg/hr at 03/16/22 1513   [START ON 03/17/2022] montelukast (SINGULAIR) tablet 10 mg  10 mg Oral Daily Meredeth Ide, MD       ondansetron [facility]) tablet 4 mg  4 mg Oral Q6H PRN Meredeth Ide, MD       Or   ondansetron (ZOFRAN) injection 4 mg  4 mg Intravenous Q6H PRN Sharl Ma, Sarina Ill, MD       pantoprazole (PROTONIX) EC tablet 40 mg  40 mg Oral Daily Meredeth Ide, MD   40 mg at 03/16/22 1035     Discharge Medications: Please see discharge summary for a list of discharge medications.  Relevant Imaging  Results:  Relevant Lab Results:   Additional Information SSN [id number]  Annice Needy, LCSW

## 2022-03-16 NOTE — Evaluation (Signed)
Physical Therapy Evaluation Patient Details Name: Elizabeth Herring MRN: [id number] DOB: 1955-09-02 Today's Date: 03/16/2022  History of Present Illness  Elizabeth Herring  is a 66 y.o. 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 Admitted 03/15/22 for acute exacerbation of CHF.  Clinical Impression  Pt admitted with above diagnosis. Required mod-max assist for bed mobility but shows good effort with UE and trunk mobility, limited mainly by LE edema and pain. Attempted to stand from bed however pt without adequate strength  at this time. Sat EOB approx 10 min for functional tasks. SpO2 92-93% throughout therapy session on RA. BP stable in supine and sitting positions. Poor tolerance supine with severe DOE while repositioning. Much better upright, repositioned for comfort and heels floating, chucks adjusted to absorb weeping from LEs. Educated on frequent repositioning for skin integrity. Pt currently with functional limitations due to the deficits listed below (see PT Problem List). Pt will benefit from skilled PT to increase their independence and safety with mobility to allow discharge to the venue listed below.          Recommendations for follow up therapy are one component of a multi-disciplinary discharge planning process, led by the attending physician.  Recommendations may be updated based on patient status, additional functional criteria and insurance authorization.  Follow Up Recommendations Skilled nursing-short term rehab (<3 hours/day) Can patient physically be transported by private vehicle: No    Assistance Recommended at Discharge Frequent or constant  Supervision/Assistance  Patient can return home with the following  Two people to help with walking and/or transfers;Two people to help with bathing/dressing/bathroom;Assist for transportation;Help with stairs or ramp for entrance;Assistance with cooking/housework    Equipment Recommendations None recommended by PT (TBD next venue of care)  Recommendations for Other Services       Functional Status Assessment Patient has had a recent decline in their functional status and demonstrates the ability to make significant improvements in function in a reasonable and predictable amount of time.     Precautions / Restrictions Precautions Precautions: Fall;Other (comment) Precaution Comments: Pressur redistribution pad when OOB Required Braces or Orthoses: Other Brace Other Brace: Prevalon boots and pressure redistribution pad Restrictions Weight Bearing Restrictions: No      Mobility  Bed Mobility Overal bed mobility: Needs Assistance Bed Mobility: Rolling, Sidelying to Sit, Sit to Supine Rolling: Mod assist Sidelying to sit: Mod assist, HOB elevated   Sit to supine: Max assist   General bed mobility comments: Required mod assist to roll, assisting with LE and using bed pad to faciliate, patient able to reach with UEs for rail, cues for technique and assist to lower LEs out of bed and support trunk, shows good strength with UEs to pull through therapist hand. Max assist for LE support back into bed.    Transfers Overall transfer level: Needs assistance Equipment used: Rolling walker (2 wheels) Transfers: Sit to/from Stand Sit to Stand: Total assist, From elevated surface           General transfer comment: Attempted to stand, from elevated bed surface. Able to reduce load on buttocks however demonstrates significant weakness an unable to fully clear buttocks from bed. Started to slide forward and required assist for LEs back into  bed.    Ambulation/Gait                   Stairs            Wheelchair Mobility    Modified Rankin (Stroke Patients Only)       Balance Overall balance assessment: Needs assistance Sitting-balance support: No upper extremity supported, Feet supported Sitting balance-Leahy Scale: Fair Sitting balance - Comments: Sat EOB nearly 10 min, progresed to unsupported sitting but close guard for safety.                                     Pertinent Vitals/Pain Pain Assessment Pain Assessment: Faces Faces Pain Scale: Hurts whole lot Pain Location: RLE with movement or skin touch Pain Descriptors / Indicators: Aching Pain Intervention(s): Monitored during session, Repositioned, Limited activity within patient's tolerance    Home Living Family/patient expects to be discharged to:: Private residence Living Arrangements: Alone   Type of Home: House Home Access: Level entry       Home Layout: One level Home Equipment: None Additional Comments: Pt lives alone single floor home no stairs.    Prior Function Prior Level of Function : Independent/Modified Independent             Mobility Comments: Patient reports full independence prior to exacerbation and admission. Could ambulate without AD in the house ADLs Comments: ind     Hand Dominance        Extremity/Trunk Assessment   Upper Extremity Assessment Upper Extremity Assessment: Defer to OT evaluation    Lower Extremity Assessment Lower Extremity Assessment: Generalized weakness;RLE deficits/detail;LLE deficits/detail RLE Deficits / Details: edematous, weeping RLE: Unable to fully assess due to pain LLE Deficits / Details: edematous weeping. LLE: Unable to fully assess due to pain       Communication   Communication: No difficulties  Cognition Arousal/Alertness: Awake/alert Behavior During Therapy: WFL for tasks assessed/performed Overall Cognitive Status: Within Functional Limits for tasks assessed                                           General Comments General comments (skin integrity, edema, etc.): SpO2 92-93% throughout session on RA. BP supine at rest 113/84 HR 95; Seated BP 120/96 HR 98. Mild DOE upright, severe DOE supine while readjusting in bed.    Exercises General Exercises - Lower Extremity Ankle Circles/Pumps: AROM, Both, 10 reps (minimal tolerance Rt) Quad Sets: Strengthening, Both, 5 reps, Supine Gluteal Sets: Strengthening, 5 reps, Seated Straight Leg Raises: Strengthening, Both, AAROM, 5 reps, Supine   Assessment/Plan    PT Assessment Patient needs continued PT services  PT Problem List Decreased strength;Decreased range of motion;Decreased activity tolerance;Decreased balance;Decreased mobility;Decreased knowledge of use of DME;Cardiopulmonary status limiting activity;Obesity;Decreased skin integrity;Pain       PT Treatment Interventions DME instruction;Gait training;Functional mobility training;Therapeutic activities;Therapeutic exercise;Balance training;Neuromuscular re-education;Patient/family education;Wheelchair mobility training    PT Goals (Current goals can be found in the Care Plan section)  Acute Rehab PT Goals Patient Stated Goal: Get rehab before going home PT Goal Formulation: With patient Time For Goal Achievement: 03/30/22 Potential to Achieve Goals: Fair    Frequency Min 3X/week     Co-evaluation               AM-PAC PT "6  Clicks" Mobility  Outcome Measure Help needed turning from your back to your side while in a flat bed without using bedrails?: A Lot Help needed moving from lying on your back to sitting on the side of a flat bed without using bedrails?: A Lot Help needed moving to and from a bed to a chair (including a wheelchair)?: A Lot Help needed standing up from a chair using your arms (e.g., wheelchair or bedside chair)?: Total Help needed to walk in hospital room?: Total Help needed climbing 3-5 steps with a railing? : Total 6  Click Score: 9    End of Session Equipment Utilized During Treatment: Gait belt Activity Tolerance: Patient tolerated treatment well (Further mobility restricted by weakness and edema of LEs) Patient left: in bed;with call bell/phone within reach;with nursing/sitter in room;Other (comment) (Bed inflated, heels floating) Nurse Communication: Mobility status PT Visit Diagnosis: Muscle weakness (generalized) (M62.81);Difficulty in walking, not elsewhere classified (R26.2);Pain Pain - part of body: Leg;Ankle and joints of foot (bil)    Time: 1024-1050 PT Time Calculation (min) (ACUTE ONLY): 26 min   Charges:   PT Evaluation $PT Eval Moderate Complexity: 1 Mod PT Treatments $Therapeutic Activity: 8-22 mins        Candie Mile, PT, DPT Physical Therapist Acute Rehabilitation Services [place] 03/16/2022, 11:06 AM

## 2022-03-16 NOTE — TOC Initial Note (Signed)
Transition of Care [facility]) - Initial/Assessment Note    Patient Details  Name: Elizabeth Herring MRN: [id number] Date of Birth: 1956/02/08  [facility] CM/SW Contact:    Annice Needy, LCSW Phone Number: 03/16/2022, 5:01 PM  Clinical Narrative:                 Patient from home. Admitted for CHF. PT recommends SNF. Patient agreeable. Referred to SNFs in area. Call to sister would not go through.   Expected Discharge Plan: Skilled Nursing Facility Barriers to Discharge: Continued Medical Work up   Patient Goals and CMS Choice Patient states their goals for this hospitalization and ongoing recovery are:: rehab then home      Expected Discharge Plan and Services Expected Discharge Plan: Skilled Nursing Facility       Living arrangements for the past 2 months: Single Family Home                                      Prior Living Arrangements/Services Living arrangements for the past 2 months: Single Family Home                     Activities of Daily Living Home Assistive Devices/Equipment: Environmental consultant (specify type) ADL Screening (condition at time of admission) Patient's cognitive ability adequate to safely complete daily activities?: Yes Is the patient deaf or have difficulty hearing?: No Does the patient have difficulty seeing, even when wearing glasses/contacts?: No Does the patient have difficulty concentrating, remembering, or making decisions?: No Patient able to express need for assistance with ADLs?: Yes Does the patient have difficulty dressing or bathing?: No Independently performs ADLs?: No Communication: Independent Dressing (OT): Needs assistance Is this a change from baseline?: Pre-admission baseline Grooming: Independent Feeding: Independent Bathing: Needs assistance Is this a change from baseline?: Pre-admission baseline Toileting: Needs assistance Is this a change from baseline?: Pre-admission baseline In/Out Bed: Needs  assistance Is this a change from baseline?: Pre-admission baseline Walks in Home: Needs assistance Is this a change from baseline?: Pre-admission baseline Does the patient have difficulty walking or climbing stairs?: Yes Weakness of Legs: Both Weakness of Arms/Hands: None  Permission Sought/Granted                  Emotional Assessment              Admission diagnosis:  Acute exacerbation of CHF (congestive heart failure) ([facility]) [I50.9] Elevated bilirubin [R17] Cardiac volume overload [E87.79] Patient Active Problem List   Diagnosis Date Noted   Acute exacerbation of CHF (congestive heart failure) ([facility]) 03/15/2022   Acute upper gastrointestinal bleeding 08/02/2021   Acute blood loss anemia 08/02/2021   Generalized weakness 08/02/2021   Chronic combined systolic and diastolic heart failure ([facility]) 08/02/2021   Hyperlipidemia 08/02/2021   Acute lower GI bleeding 08/01/2021   STEMI (ST elevation myocardial infarction) ([facility]) 07/18/2020   Ischemic cardiomyopathy 07/18/2020   Smoker 07/18/2020   Pneumonia 07/15/2020   PNA (pneumonia) 07/14/2020   Bronchitis due to tobacco use 07/14/2020   Essential hypertension 07/14/2020   PCP:  Pcp, No Pharmacy:   [facility] [address] [place] [address] Phone: [phone] Fax: [phone]  [facility] [address], [place] [address] [place] [address] [place] [address] [place] [address] Phone: [phone] Fax: [phone]     Social Determinants of Health (SDOH)  Interventions    Readmission Risk Interventions     No data to display

## 2022-03-17 ENCOUNTER — Encounter (HOSPITAL_COMMUNITY): Payer: Self-pay | Admitting: Family Medicine

## 2022-03-17 DIAGNOSIS — I2510 Atherosclerotic heart disease of native coronary artery without angina pectoris: Secondary | ICD-10-CM

## 2022-03-17 DIAGNOSIS — Z515 Encounter for palliative care: Secondary | ICD-10-CM | POA: Diagnosis not present

## 2022-03-17 DIAGNOSIS — E785 Hyperlipidemia, unspecified: Secondary | ICD-10-CM | POA: Diagnosis not present

## 2022-03-17 DIAGNOSIS — I5043 Acute on chronic combined systolic (congestive) and diastolic (congestive) heart failure: Secondary | ICD-10-CM

## 2022-03-17 DIAGNOSIS — Z7189 Other specified counseling: Secondary | ICD-10-CM | POA: Diagnosis not present

## 2022-03-17 DIAGNOSIS — L8990 Pressure ulcer of unspecified site, unspecified stage: Secondary | ICD-10-CM | POA: Insufficient documentation

## 2022-03-17 DIAGNOSIS — I371 Nonrheumatic pulmonary valve insufficiency: Secondary | ICD-10-CM

## 2022-03-17 LAB — BASIC METABOLIC PANEL
Anion gap: 10 (ref 5–15)
BUN: 42 mg/dL — ABNORMAL HIGH (ref 8–23)
CO2: 28 mmol/L (ref 22–32)
Calcium: 9.1 mg/dL (ref 8.9–10.3)
Chloride: 97 mmol/L — ABNORMAL LOW (ref 98–111)
Creatinine, Ser: 1.44 mg/dL — ABNORMAL HIGH (ref 0.44–1.00)
GFR, Estimated: 40 mL/min — ABNORMAL LOW (ref >60)
Glucose, Bld: 105 mg/dL — ABNORMAL HIGH (ref 70–99)
Potassium: 4.3 mmol/L (ref 3.5–5.1)
Sodium: 135 mmol/L (ref 135–145)

## 2022-03-17 LAB — BRAIN NATRIURETIC PEPTIDE: B Natriuretic Peptide: 3108 pg/mL — ABNORMAL HIGH (ref 0.0–100.0)

## 2022-03-17 LAB — MAGNESIUM: Magnesium: 2.3 mg/dL (ref 1.7–2.4)

## 2022-03-17 NOTE — Progress Notes (Signed)
Pt's sister [name] called to check, nursing staff verified that she is listed on emergency contact for HIPAA. She expressed safety concerns of her sister living alone. I informed her that the pt has made her needs known and wants home health to come in upon DC.

## 2022-03-17 NOTE — Progress Notes (Signed)
PROGRESS NOTE    Patient: Elizabeth Herring                            PCP: Pcp, No                    DOB: Oct 20, 1955            DOA: 03/15/2022 KPQ:[id number]             DOS: 03/17/2022, 12:49 PM   LOS: 2 days   Date of Service: The patient was seen and examined on 03/17/2022  Subjective:   The patient was seen and examined this morning, still complaining shortness of breath, reporting some improvement, still complaining of lower extremity edema but improving with diuretics  Patient has been on Lasix drip since yesterday  She was made aware of her progressive heart failure.[name]  Permission for consultation for palliative she has become tearful but accepting  Brief Narrative:   Anaika Santillano is a 66 y.o. 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  Patient denies chest pain or shortness of breath at this time.  Denies nausea vomiting or diarrhea.  Denies abdominal pain.   ED events: Patient was found significantly volume overload with extensive bilateral lower extremity edema to the thigh area, BNP > 4500, troponin elevated 330, 452.  Cardiology was consulted, patient started on IV Lasix.  Also found to have elevated alk phos 219, AST 60, ALT 26, total bilirubin 5.0   Initially BiPAP was applied, BiPAP has been weaned off.  Currently on room air.     Acute on chronic systolic and diastolic HF with exacerbation/  ICM LVEF < 20%  -Repeating echo: <20%. The left  ventricle has severely decreased function.,  Reduced right LV systolic, chronic 0.6 x 0.4 cm mobile echodensity attached to the pulmonic  valve leaflet with motion pattern independent of the  pulmonic valve. There  is mild to moderate pulmonary valve regurgitation.   -Per Cardiology 11/8 >>initiating 80 mg IV Lasix, >>> Lasix drip 10 mg/hr    Intake/Output Summary (Last 24 hours) at 03/17/2022 1249 Last data filed at 03/17/2022 0901 Gross per 24 hour  Intake 778.39 ml  Output 1950 ml  Net -1171.61 ml    -Monitoring Reds Clip , BMP, daily weights, I's and O's -Hold beta-blockers -Hold ACEi/ARB/ARNI/MRA - Continue Farxiga.  Entresto on hold as per cardiology recommendation.  Filed Weights   03/15/22 1147 03/15/22 2201  Weight: 63.5 kg 77.6 kg    -Patient not a candidate for ICD due to GDMT noncompliance, per EP  Volume overload-bilateral lower extremity edema -secondary to above.  She also has open areas in lower extremities.  -  wound care consult for wound care management of lower extremity wounds   Multivessel CAD  -Elevated troponin-likely ischemic demand -Continue aspirin 81 mg and Plavix 75 mg once daily, indefinitely -Continue atorvastatin 80 mg nightly   HLD -Continue atorvastatin 80 mg nightly -Outpatient management   Transaminitis-patient has transaminitis with increased bilirubin.  We will obtain abdominal ultrasound.  CKD stage IIIb Lab Results  Component Value Date   CREATININE 1.44 (H) 03/17/2022   CREATININE 1.38 (H) 03/16/2022  CREATININE 1.58 (H) 03/15/2022    -creatinine 1.58, slightly above baseline of 1.21 as of March 2023.[name]  Entresto on hold.     Skin Assessment: I have examined the patient's skin and I agree with the wound assessment as performed by wound care team As outlined belowe: Pressure Injury 03/15/22 Buttocks Medial Stage 2 -  Partial thickness loss of dermis presenting as a shallow open injury with a red, pink wound bed without slough. healing st 2 (Active)  03/15/22 2215  Location: Buttocks  Location Orientation: Medial  Staging: Stage 2 -  Partial thickness loss of dermis presenting as a shallow open injury with a  red, pink wound bed without slough.  Wound Description (Comments): healing st 2  Present on Admission: Yes  Dressing Type Foam - Lift dressing to assess site every shift 03/15/22 2214     ------------------------------------------------------------------------------------------------------------------------------------------------  DVT prophylaxis:   SCDs  Code Status:   Code Status: Full Code  Family Communication: No family member present at bedside- attempt will be made to update daily The above findings and plan of care has been discussed with patient (and family)  in detail,  they expressed understanding and agreement of above. -Advance care planning has been discussed.   -Palliative care consulted to determine goals of care and CODE STATUS   Admission status:   Status is: Inpatient Remains inpatient appropriate because: Needing aggressive IV medication for diuresis due to significant volume overload     Procedures:   No admission procedures for hospital encounter.   Antimicrobials:  Anti-infectives (From admission, onward)    None        Medication:   aspirin EC  81 mg Oral Daily   atorvastatin  80 mg Oral Daily   clopidogrel  75 mg Oral Daily   dapagliflozin propanediol  10 mg Oral Q breakfast   montelukast  10 mg Oral Daily   pantoprazole  40 mg Oral Daily    acetaminophen **OR** acetaminophen, ondansetron **OR** ondansetron (ZOFRAN) IV   Objective:   Vitals:   03/16/22 0826 03/16/22 1400 03/16/22 2210 03/17/22 0402  BP: 105/79 110/82 109/75 114/73  Pulse: 95 88 94 87  Resp: _0 Temp: 98.1 F (36.7 C) 98 F (36.7 C) 98.3 F (36.8 C) 98.4 F (36.9 C)  TempSrc: Axillary Oral    SpO2: 93% 94% 98% 94%  Weight:      Height:        Intake/Output Summary (Last 24 hours) at 03/17/2022 1249 Last data filed at 03/17/2022 0901 Gross per 24 hour  Intake 778.39 ml  Output 1950 ml  Net -1171.61 ml   Filed Weights   03/15/22 1147  03/15/22 2201  Weight: 63.5 kg 77.6 kg     Examination:     Physical Exam:   General:  AAO x 3,  cooperative, no distress;   HEENT:  Normocephalic, PERRL, otherwise with in Normal limits   Neuro:  CNII-XII intact. , normal motor and sensation, reflexes intact   Lungs:   Clear to auscultation BL, Respirations unlabored,  No wheezes / crackles  Cardio:    S1/S2, RRR, No murmure, No Rubs or Gallops   Abdomen:  Soft, non-tender, bowel sounds active all four quadrants, no guarding or peritoneal signs.  Muscular  skeletal:  Limited exam -global generalized weaknesses - in bed, able to move all 4 extremities,   2+ pulses,  symmetric, ++3 pitting edema  Skin:  Dry, warm to touch, negative for any Rashes,  Wounds: Please see nursing documentation  Pressure Injury 03/15/22 Buttocks Medial Stage 2 -  Partial thickness loss of dermis presenting as a shallow open injury with a red, pink wound bed without slough. healing st 2 (Active)  03/15/22 2215  Location: Buttocks  Location Orientation: Medial  Staging: Stage 2 -  Partial thickness loss of dermis presenting as a shallow open injury with a red, pink wound bed without slough.  Wound Description (Comments): healing st 2  Present on Admission: Yes  Dressing Type Foam - Lift dressing to assess site every shift 03/15/22 2214         ------------------------------------------------------------------------------------------------------------------------------------------    LABs:     Latest Ref Rng & Units 03/16/2022    4:28 AM 03/15/2022    1:07 PM 08/04/2021   12:56 PM  CBC  WBC 4.0 - 10.5 K/uL 8.5  7.8  4.3   Hemoglobin 12.0 - 15.0 g/dL 12.9  13.6  9.5   Hematocrit 36.0 - 46.0 % 38.5  41.3  28.2   Platelets 150 - 400 K/uL 173  185  174       Latest Ref Rng & Units 03/17/2022    3:56 AM 03/16/2022    4:28 AM 03/15/2022    1:07 PM  CMP  Glucose 70 - 99 mg/dL 105  111  87   BUN 8 - 23 mg/dL 42  43  44   Creatinine 0.44 - 1.00  mg/dL 1.44  1.38  1.58   Sodium 135 - 145 mmol/L 135  133  133   Potassium 3.5 - 5.1 mmol/L 4.3  4.5  5.2   Chloride 98 - 111 mmol/L 97  97  96   CO2 22 - 32 mmol/L _0 Calcium 8.9 - 10.3 mg/dL 9.1  9.1  9.6   Total Protein 6.5 - 8.1 g/dL  7.3  8.6   Total Bilirubin 0.3 - 1.2 mg/dL  4.9  5.0   Alkaline Phos 38 - 126 U/L  188  219   AST 15 - 41 U/L  43  60   ALT 0 - 44 U/L  20  26        Micro Results No results found for this or any previous visit (from the past 240 hour(s)).  Radiology Reports No results found.  SIGNED: Deatra James, MD, FHM. [facility],  Pager (please use [email or web address] to page/text) Please use Epic Secure Chat for non-urgent communication (7AM-7PM)  If 7PM-7AM, please contact night-coverage [email or web address], 03/17/2022, 12:49 PM

## 2022-03-17 NOTE — Consult Note (Signed)
Consultation Note Date: 03/17/2022   Patient Name: Elizabeth Herring  DOB: August 19, 1955  MRN: [id number]  Age / Sex: 66 y.o., female  PCP: Pcp, No Referring Physician: Kendell Bane, MD  Reason for Consultation: Establishing goals of care  HPI/Patient Profile: 66 y.o. female  with past medical history of chronic systolic heart failure with ischemic cardiomyopathy, last EF October 2022 20% with global hypokinesis and moderate RV discuss oxygen, HTN/HLD, CAD with severe multivessel disease and unsuccessful PCI with plan for medical management, admitted on 03/15/2022 with acute systolic/diastolic heart failure exacerbation on Lasix infusion.   Clinical Assessment and Goals of Care: I have reviewed medical records including EPIC notes, labs and imaging, received report from RN, assessed the patient.  Elizabeth Herring, is resting quietly in bed.  She appears acutely/chronically ill and quite frail.  She is resting comfortably, but wakes easily when I enter.  She does keep her eyes closed through some of our conversation.  She is alert and oriented x3, able to make her basic needs known.  There is no family at bedside at this time.    We meet at bedside to discuss diagnosis prognosis, GOC, EOL wishes, disposition and options.  I introduced Palliative Medicine as specialized medical care for people living with serious illness. It focuses on providing relief from the symptoms and stress of a serious illness. The goal is to improve quality of life for both the patient and the family.  We focused on their current illness.  I shared that I understand she has some issues with her heart, and ask what the doctors have told her.  Elizabeth Herring states tearfully that she "just found out" that she has heart problems.  We talk about her heart failure and the treatment plan including, but not limited to, Lasix infusion, medical  management of heart conditions.  We talk about going to short-term rehab in an attempt to gain strength.  She tells me she would like to go to rehab.  Advanced directives, concepts specific to code status, artifical feeding and hydration, and rehospitalization were considered and discussed.  We talk about the concept of "treat the treatable, but allowing natural passing".  At this point Elizabeth Herring states that she wants to remain full scope/full code.  I asked if she were to need life support, how long would she accept intubation.  At this point she is unable to set limits.  We talk about HCPOA, see below.  Discussed the importance of continued conversation with family and the medical providers regarding overall plan of care and treatment options, ensuring decisions are within the context of the patient's values and GOCs.  Questions and concerns were addressed.  The patient was encouraged to call with questions or concerns.  PMT will continue to support holistically.  Conference with attending, bedside nursing staff, transition of care team related to patient condition, needs, goals of care, disposition.    HCPOA NEXT OF KIN -Elizabeth Herring tells me that she is unmarried and has no children.  She names  her sisters, Elizabeth Herring and Elizabeth Herring to make choices for her if she cannot.      SUMMARY OF RECOMMENDATIONS   At this point full scope/full code Time for outcomes Short-term rehab   Code Status/Advance Care Planning: Full code -we talked about the concept of "treat the treatable, but allowing natural passing".  It seems that this may be the first time Elizabeth Herring has considered her mortality.  Symptom Management:  Per hospitalist/cardiologist, no additional needs at this time.  Palliative Prophylaxis:  Oral Care and Turn Reposition  Additional Recommendations (Limitations, Scope, Preferences): Full Scope Treatment  Psycho-social/Spiritual:  Desire for further Chaplaincy  support:yes Additional Recommendations: Caregiving  Support/Resources and Education on Hospice  Prognosis:  Unable to determine, based on outcomes.  Guarded at this point.  6 months or less would not be surprising based on chronic cardiac conditions that will be treated through medical management.  Discharge Planning: Short-term rehab with a goal of returning home      Primary Diagnoses: Present on Admission: **None**   I have reviewed the medical record, interviewed the patient and family, and examined the patient. The following aspects are pertinent.  Past Medical History:  Diagnosis Date   Coronary artery disease    Severe multivessel disease with unsuccessful PCI attempt and medical management 07/2020   HFrEF (heart failure with reduced ejection fraction) (HCC)    Hyperlipidemia    Hypertension    Ischemic cardiomyopathy    Noncompliance    NSVT (nonsustained ventricular tachycardia) (HCC)    ST elevation myocardial infarction (STEMI) of inferior wall (HCC)    Social History   Socioeconomic History   Marital status: Single    Spouse name: Not on file   Number of children: Not on file   Years of education: Not on file   Highest education level: Not on file  Occupational History   Not on file  Tobacco Use   Smoking status: Every Day    Packs/day: 1.00    Types: Cigarettes   Smokeless tobacco: Never   Tobacco comments:    Maybe 1 cig. Per day per pt---10/20/20 TW  Vaping Use   Vaping Use: Never used  Substance and Sexual Activity   Alcohol use: Yes   Drug use: Never   Sexual activity: Not on file  Other Topics Concern   Not on file  Social History Narrative   Not on file   Social Determinants of Health   Financial Resource Strain: Not on file  Food Insecurity: Not on file  Transportation Needs: Not on file  Physical Activity: Not on file  Stress: Not on file  Social Connections: Not on file   Family History  Problem Relation Age of Onset    Hypertension Father    Scheduled Meds:  aspirin EC  81 mg Oral Daily   atorvastatin  80 mg Oral Daily   clopidogrel  75 mg Oral Daily   dapagliflozin propanediol  10 mg Oral Q breakfast   montelukast  10 mg Oral Daily   pantoprazole  40 mg Oral Daily   Continuous Infusions:  furosemide (LASIX) 200 mg in dextrose 5 % 100 mL (2 mg/mL) infusion 10 mg/hr (03/17/22 0901)   PRN Meds:.acetaminophen **OR** acetaminophen, ondansetron **OR** ondansetron (ZOFRAN) IV Medications Prior to Admission:  Prior to Admission medications   Medication Sig Start Date End Date Taking? Authorizing Provider  potassium chloride (KLOR-CON) 10 MEQ tablet Take 1 tablet (10 mEq total) by mouth once daily. 01/14/22  Yes  Antonieta Iba, MD  aspirin EC 81 MG tablet Take 1 tablet (81 mg total) by mouth daily. Swallow whole. Resume on 08/12/2021 08/04/21 08/04/22  Erick Blinks, MD  atorvastatin (LIPITOR) 80 MG tablet TAKE ONE TABLET BY MOUTH ONCE EVERY DAY. 03/03/21 03/03/22  Alver Sorrow, NP  clopidogrel (PLAVIX) 75 MG tablet Take 1 tablet (75 mg total) by mouth once daily. Resume on 08/12/2021 08/04/21   Erick Blinks, MD  dapagliflozin propanediol (FARXIGA) 10 MG TABS tablet Take 1 tablet (10 mg total) by mouth daily with breakfast. Please keep appointment for further refills 01/17/22   Antonieta Iba, MD  furosemide (LASIX) 20 MG tablet Take 2 tablets (40 mg total) by mouth 2 (two) times daily as directed. 01/14/22   Antonieta Iba, MD  montelukast (SINGULAIR) 10 MG tablet Take 1 tablet by mouth once daily. 06/14/21     Multiple Vitamin (MULTI-VITAMIN) tablet Take 1 tablet by mouth daily.    [provider]  Olopatadine HCl (PATADAY) 0.2 % SOLN Instill 1 drop in both eyes once daily. 06/14/21     pantoprazole (PROTONIX) 40 MG tablet Take 1 tablet (40 mg total) by mouth once daily. Patient not taking: Reported on 03/16/2022 08/04/21   Erick Blinks, MD  sacubitril-valsartan (ENTRESTO) 24-26 MG Take 1 tablet  by mouth 2 (two) times daily. 09/16/21   Ronney Asters, NP   Allergies  Allergen Reactions   Chlorhexidine    Review of Systems  Unable to perform ROS: Other    Physical Exam Vitals and nursing note reviewed.  Constitutional:      General: She is not in acute distress.    Appearance: She is ill-appearing.  HENT:     Mouth/Throat:     Mouth: Mucous membranes are moist.  Cardiovascular:     Rate and Rhythm: Normal rate.  Pulmonary:     Effort: Pulmonary effort is normal. No respiratory distress.  Skin:    General: Skin is warm and dry.  Neurological:     Mental Status: She is alert and oriented to person, place, and time.  Psychiatric:        Mood and Affect: Mood normal.        Behavior: Behavior normal.     Vital Signs: BP 114/73 (BP Location: Left Arm)   Pulse 87   Temp 98.4 F (36.9 C)   Resp 16   Ht 5' (1.524 m)   Wt 77.6 kg   SpO2 94%   BMI 33.41 kg/m  Pain Scale: 0-10   Pain Score: Asleep   SpO2: SpO2: 94 % O2 Device:SpO2: 94 % O2 Flow Rate: .O2 Flow Rate (L/min): 0 L/min  IO: Intake/output summary:  Intake/Output Summary (Last 24 hours) at 03/17/2022 1335 Last data filed at 03/17/2022 0901 Gross per 24 hour  Intake 778.39 ml  Output 1950 ml  Net -1171.61 ml    LBM: Last BM Date : 03/15/22 Baseline Weight: Weight: 63.5 kg Most recent weight: Weight: 77.6 kg     Palliative Assessment/Data:   Flowsheet Rows    Flowsheet Row Most Recent Value  Intake Tab   Referral Department Hospitalist  Unit at Time of Referral Cardiac/Telemetry Unit  Palliative Care Primary Diagnosis Cardiac  Date Notified 03/17/22  Palliative Care Type New Palliative care  Reason for referral Clarify Goals of Care  Date of Admission 03/15/22  Date first seen by Palliative Care 03/17/22  # of days Palliative referral response time 0 Day(s)  # of days  IP prior to Palliative referral 2  Clinical Assessment   Palliative Performance Scale Score 30%  Pain Max last 24  hours Not able to report  Pain Min Last 24 hours Not able to report  Dyspnea Max Last 24 Hours Not able to report  Dyspnea Min Last 24 hours Not able to report  Psychosocial & Spiritual Assessment   Palliative Care Outcomes        Time In: 1100 Time Out: 1215 Time Total: 75 minutes  Greater than 50%  of this time was spent counseling and coordinating care related to the above assessment and plan.  Signed by: Katheran Awe, NP   Please contact Palliative Medicine Team phone at [phone] for questions and concerns.  For individual provider: See Loretha Stapler

## 2022-03-17 NOTE — Progress Notes (Signed)
Progress Note  Patient Name: Elizabeth Herring Date of Encounter: 03/17/2022  Primary Cardiologist: Ida Rogue, MD  Subjective   Patient stated she did not get good sleep last night due to her legs weeping and draining. Nurse placed dressings on the lower extremity wounds. Otherwise she denied any shortness of breath this morning however she appeared to be breathing hard at rest. Echo was performed yesterday which showed LVEF less than 20% and moderately reduced RV systolic function as well. There was a 0.6 into 0.4 cm mobile echodensity attached to pulmonic valve leaflet with motion but an independent of the pulmonic valve resulting in mild to moderate pulmonary valve regurgitation.  Intake/Output Summary (Last 24 hours) at 03/17/2022 1320 Last data filed at 03/17/2022 0901 Gross per 24 hour  Intake 778.39 ml  Output 1950 ml  Net -1171.61 ml     Inpatient Medications    Scheduled Meds:  aspirin EC  81 mg Oral Daily   atorvastatin  80 mg Oral Daily   clopidogrel  75 mg Oral Daily   dapagliflozin propanediol  10 mg Oral Q breakfast   montelukast  10 mg Oral Daily   pantoprazole  40 mg Oral Daily   Continuous Infusions:  furosemide (LASIX) 200 mg in dextrose 5 % 100 mL (2 mg/mL) infusion 10 mg/hr (03/17/22 0901)   PRN Meds: acetaminophen **OR** acetaminophen, ondansetron **OR** ondansetron (ZOFRAN) IV   Vital Signs    Vitals:   03/16/22 0826 03/16/22 1400 03/16/22 2210 03/17/22 0402  BP: 105/79 110/82 109/75 114/73  Pulse: 95 88 94 87  Resp: 18 19 20 16   Temp: 98.1 F (36.7 C) 98 F (36.7 C) 98.3 F (36.8 C) 98.4 F (36.9 C)  TempSrc: Axillary Oral    SpO2: 93% 94% 98% 94%  Weight:      Height:        Intake/Output Summary (Last 24 hours) at 03/17/2022 1318 Last data filed at 03/17/2022 0901 Gross per 24 hour  Intake 778.39 ml  Output 1950 ml  Net -1171.61 ml   Filed Weights   03/15/22 1147 03/15/22 2201  Weight: 63.5 kg 77.6 kg    Telemetry      Personally reviewed, HR controlled  ECG    An ECG dated 03/15/22 was personally reviewed today and demonstrated:  NSR and RBBB  Physical Exam   GEN: No acute distress.   Neck: JVD+ Cardiac: RRR, no murmur, rub, or gallop.  Respiratory: Labored.  GI: Soft, nontender, bowel sounds present. MS: Anasarca + Neuro:  Nonfocal. Psych: Alert and oriented x 3. Normal affect.  Labs    Chemistry Recent Labs  Lab 03/15/22 1307 03/16/22 0428 03/17/22 0356  NA 133* 133* 135  K 5.2* 4.5 4.3  CL 96* 97* 97*  CO2 24 26 28   GLUCOSE 87 111* 105*  BUN 44* 43* 42*  CREATININE 1.58* 1.38* 1.44*  CALCIUM 9.6 9.1 9.1  PROT 8.6* 7.3  --   ALBUMIN 3.6 2.9*  --   AST 60* 43*  --   ALT 26 20  --   ALKPHOS 219* 188*  --   BILITOT 5.0* 4.9*  --   GFRNONAA 36* 42* 40*  ANIONGAP 13 10 10      Hematology Recent Labs  Lab 03/15/22 1307 03/16/22 0428  WBC 7.8 8.5  RBC 4.12 3.91  HGB 13.6 12.9  HCT 41.3 38.5  MCV 100.2* 98.5  MCH 33.0 33.0  MCHC 32.9 33.5  RDW 20.2* 19.9*  PLT 185 173  Cardiac Enzymes Recent Labs  Lab 03/15/22 1307 03/15/22 1513  TROPONINIHS 330* 252*    BNP Recent Labs  Lab 03/15/22 1307 03/17/22 0356  BNP >4,500.0* 3,108.0*     DDimerNo results for input(s): "DDIMER" in the last 168 hours.   Radiology    ECHOCARDIOGRAM COMPLETE  Result Date: 03/16/2022    ECHOCARDIOGRAM REPORT   Patient Name:   Elizabeth Herring Date of Exam: 03/16/2022 Medical Rec #:  [id number]             Height:       60.0 in Accession #:    [id number]            Weight:       171.1 lb Date of Birth:  Jun 18, 1955             BSA:          1.747 m Patient Age:    66 years              BP:           98/75 mmHg Patient Gender: F                     HR:           99 bpm. Exam Location:  [facility] Procedure: 2D Echo, Cardiac Doppler, Color Doppler and Intracardiac            Opacification Agent Indications:    Cardiomyopathy-Ischemic  History:        Patient has prior history of  Echocardiogram examinations, most                 recent 02/11/2021. Cardiomyopathy and CHF, Previous Myocardial                 Infarction; Risk Factors:Hypertension, Current Smoker and                 Dyslipidemia.  Sonographer:    Wenda Low Referring Phys: [place]  1. Left ventricular ejection fraction, by estimation, is <20%. The left ventricle has severely decreased function. The left ventricle has no regional wall motion abnormalities. The left ventricular internal cavity size was mildly dilated. Left ventricular diastolic parameters are consistent with Grade I diastolic dysfunction (impaired relaxation). There is the interventricular septum is flattened in diastole ('D' shaped left ventricle), consistent with right ventricular volume overload. No evidence of LV thrombus.  2. Right ventricular systolic function is moderately reduced. The right ventricular size is mildly enlarged. Moderately increased right ventricular wall thickness. Unable to calculate PASP due to severe TR.  3. The tricuspid valve is abnormal. Tricuspid valve regurgitation is severe.  4. There is a 0.6 x 0.4 cm mobile echodensity attached to the pulmonic valve leaflet with motion pattern independent of the pulmonic valve. There is mild to moderate pulmonary valve regurgitation. Differential include fibroelastoma, calcified valve, vegetation, thrombus. Clinical correlation is recommended.  5. The inferior vena cava is dilated in size with <50% respiratory variability, suggesting right atrial pressure of 15 mmHg.  6. Large pleural effusion in both left and right lateral regions. Comparison(s): The mobile echodensity on pulmonic valve was present on prior Echo from 02/2021. FINDINGS  Left Ventricle: Left ventricular ejection fraction, by estimation, is <20%. The left ventricle has severely decreased function. The left ventricle has no regional wall motion abnormalities. Definity contrast agent was given IV to  delineate the left ventricular endocardial borders. The left ventricular internal  cavity size was mildly dilated. There is no left ventricular hypertrophy. The interventricular septum is flattened in diastole ('D' shaped left ventricle), consistent with right ventricular volume overload. Left ventricular diastolic parameters are consistent with Grade I diastolic dysfunction (impaired relaxation).  LV Wall Scoring: There is diffuse hypokinesis. Right Ventricle: The right ventricular size is mildly enlarged. Moderately increased right ventricular wall thickness. Right ventricular systolic function is moderately reduced. The tricuspid regurgitant velocity is 1.96 m/s, and with an assumed right atrial pressure of 15 mmHg, the estimated right ventricular systolic pressure is [id number] mmHg. Left Atrium: Left atrial size was mildly dilated. Right Atrium: Right atrial size was severely dilated. Pericardium: There is no evidence of pericardial effusion. Mitral Valve: The mitral valve is abnormal. Severely decreased mobility of the mitral valve leaflets. Trivial mitral valve regurgitation. No evidence of mitral valve stenosis. MV peak gradient, 8.9 mmHg. The mean mitral valve gradient is 4.0 mmHg. Tricuspid Valve: The tricuspid valve is abnormal. Tricuspid valve regurgitation is severe. No evidence of tricuspid stenosis. Aortic Valve: The aortic valve is tricuspid. Aortic valve regurgitation is not visualized. No aortic stenosis is present. Aortic valve mean gradient measures 3.0 mmHg. Aortic valve peak gradient measures 5.3 mmHg. Aortic valve area, by VTI measures 1.67 cm. Pulmonic Valve: There is a 0.6 x 0.4 cm mobile echodensity attached to the pulmonic valve leaflet with motion pattern independent of the pulmonic valve. Differential include fibroelastoma, calcified valve, vegetation, thrombus. Clinical correlation is recommended. Pulmonic valve regurgitation is mild to moderate. No evidence of pulmonic stenosis. Aorta: The  aortic root is normal in size and structure. Venous: The inferior vena cava is dilated in size with less than 50% respiratory variability, suggesting right atrial pressure of 15 mmHg. IAS/Shunts: There is left bowing of the interatrial septum, suggestive of elevated right atrial pressure. No atrial level shunt detected by color flow Doppler. Additional Comments: There is a large pleural effusion in both left and right lateral regions. Mild ascites is present.  LEFT VENTRICLE PLAX 2D LVIDd:         6.00 cm LVIDs:         5.80 cm LV PW:         1.00 cm LV IVS:        1.00 cm LVOT diam:     1.90 cm LV SV:         32 LV SV Index:   18 LVOT Area:     2.84 cm  LV Volumes (MOD) LV vol d, MOD A4C: 112.0 ml LV SV MOD A4C:     112.0 ml RIGHT VENTRICLE RV Basal diam:  3.65 cm RV Mid diam:    3.40 cm RV S prime:     8.15 cm/s TAPSE (M-mode): 1.5 cm LEFT ATRIUM             Index        RIGHT ATRIUM           Index LA diam:        4.60 cm 2.63 cm/m   RA Area:     31.90 cm LA Vol (A2C):   77.0 ml 44.08 ml/m  RA Volume:   124.00 ml 70.99 ml/m LA Vol (A4C):   63.8 ml 36.53 ml/m LA Biplane Vol: 69.5 ml 39.79 ml/m  AORTIC VALVE                    PULMONIC VALVE AV Area (Vmax):    2.31 cm  PV Vmax:       1.05 m/s AV Area (Vmean):   1.92 cm     PV Peak grad:  4.4 mmHg AV Area (VTI):     1.67 cm AV Vmax:           115.00 cm/s AV Vmean:          85.400 cm/s AV VTI:            0.192 m AV Peak Grad:      5.3 mmHg AV Mean Grad:      3.0 mmHg LVOT Vmax:         93.80 cm/s LVOT Vmean:        57.800 cm/s LVOT VTI:          0.113 m LVOT/AV VTI ratio: 0.59  AORTA Ao Root diam: 2.60 cm MITRAL VALVE                TRICUSPID VALVE MV Area (PHT): 5.62 cm     TR Peak grad:   15.4 mmHg MV Area VTI:   1.15 cm     TR Vmax:        196.00 cm/s MV Peak grad:  8.9 mmHg MV Mean grad:  4.0 mmHg     SHUNTS MV Vmax:       1.49 m/s     Systemic VTI:  0.11 m MV Vmean:      88.9 cm/s    Systemic Diam: 1.90 cm MV Decel Time: 135 msec MV E velocity:  84.40 cm/s MV A velocity: 126.00 cm/s MV E/A ratio:  0.67 [place] Hanlon Priya Michalla Ringer Electronically signed by Winfield Rast Deni Berti Signature Date/Time: 03/16/2022/2:35:24 PM    Final    US Abdomen Limited  Result Date: 03/16/2022 CLINICAL DATA:  Elevated bilirubin EXAM: ULTRASOUND ABDOMEN LIMITED RIGHT UPPER QUADRANT COMPARISON:  None Available. FINDINGS: Gallbladder: No visible gallstones. Small amount of sludge in the gallbladder. There is regional ascites. Patient unable to communicate for determination of Murphy sign. Common bile duct: Diameter: No evidence of intrahepatic ductal dilatation. Common duct not well seen because of bowel gas and ascites. Liver: Overall normal echogenicity. Mild nodularity of the surface suggesting cirrhosis. No focal lesion. Portal vein is patent on color Doppler imaging with normal direction of blood flow towards the liver. Other: None. IMPRESSION: 1. Mild nodularity of the liver surface suggesting cirrhosis. No focal lesion. 2. Small amount of sludge in the gallbladder. No visible gallstones. 3. Perihepatic ascites. 4. Common duct not well seen because of bowel gas and ascites. No evidence of intrahepatic ductal dilatation. Electronically Signed   By: Paulina Fusi M.D.   On: 03/16/2022 09:05    Cardiac Studies  Echo on 03/16/2022 LVEF <20%  [facility]hc in 2022 Prox RCA lesion is 100% stenosed. CTO Mid LM lesion is 25% stenosed. Dist LAD lesion is 95% stenosed. Prox LAD to Dist LAD lesion is 75% stenosed. Dist LM to Prox LAD lesion is 50% stenosed. Prox Cx to Mid Cx lesion is 100% stenosed. In-stent thrombosis of the stent IRA TIMI 0 flow Moderate collaterals left to right left to left Left ventricular function severely depressed left ventricular enlargement EF between 25 and 30% globally Unsuccessful attempted PCI and stent to ostial old stent to the circumflex with in-stent thrombosis failure to cross with a wire  Assessment & Plan    Patient is a 66 year old F  known to have multivessel CAD (RCA CTO, LAD 75 to 95%, LCx ISR CTO with moderate collaterals L  to R and L to L) with LVEF less than 20% and no device (due to GDMT noncompliance, per EP), ICM LVEF less than 20% with no device, HLD was admitted to the hospitalist team for the management of ADHF.  #Acute systolic and diastolic heart failure exacerbation #ICM LVEF less than 20% with no device Plan -Increase Lasix drip from 10 mg/h to 15 mg/h due to inadequate urine output. 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 This echodensity was also seen on the prior echo but the size was small or not well visualized and the regurgitation was mild. It does not appear to be a thrombus either as echodensity looks calcified. We will monitor for now.  # Multivessel CAD (RCA CTO, LAD 75 to 95%, LCx ISR CTO with moderate collaterals L to R and L to L)  Plan -Continue aspirin 81 mg and Plavix 100 mg once daily, indefinitely -Continue atorvastatin 80 mg nightly  #HLD Plan -Continue atorvastatin 80 mg nightly -Outpatient management  I have spent a total of 33 minutes with patient reviewing chart , telemetry, EKGs, labs and examining patient as well as establishing an assessment and plan that was discussed with the patient.  > 50% of time was spent in direct patient care.      Signed, Chalmers Guest, MD  03/17/2022, 1:18 PM

## 2022-03-17 NOTE — Progress Notes (Signed)
   03/17/22 1525  Vitals  BP 130/89  MAP (mmHg) 99  BP Location Left Arm  BP Method Automatic  Patient Position (if appropriate) Lying  Pulse Rate (!) 102  Pulse Rate Source Dinamap  ECG Heart Rate (!) 102  Resp (!) 24  Level of Consciousness  Level of Consciousness Alert  MEWS COLOR  MEWS Score Color Yellow  Oxygen Therapy  SpO2 (!) 89 %  O2 Device Room Air  MEWS Score  MEWS Temp 0  MEWS Systolic 0  MEWS Pulse 1  MEWS RR 1  MEWS LOC 0  MEWS Score 2   Assumed care of this patient from off-going nurse. Pt initially sleeping when I entered room, resp 24/min. Easily awakened, denies c/o pain or increased SOB. Pt assisted to reposition in bed. Breath sounds with expiratory wheezes upper lobes, diminished bases. SaO2 89% on room air, even after repositioning pt and having her take deep breaths. O2 @ 2lpm [place] started per order to maintain SaO2 >92%. Pt agreeable. MD Shahmedhi updated on pt's O2 needs. Pt with hard, pitting edema from toes up to mid chest and bilateral breasts. Received call from [facility] while getting pt report that pt had sustained run of SVT but then converted back to ST. Pt currently in ST at 102/min. Other VSS. Pt given something to drink, tolerated well without difficulty swallowing. IV Lasix continues per order with no s/s infiltration. Prevalon boots on, DDI to bilateral lower legs. Foam dressing intact to sacrum. Call bell within reach, advised to call for needs, states understanding.

## 2022-03-18 DIAGNOSIS — I5043 Acute on chronic combined systolic (congestive) and diastolic (congestive) heart failure: Secondary | ICD-10-CM | POA: Diagnosis not present

## 2022-03-18 LAB — BASIC METABOLIC PANEL
Anion gap: 11 (ref 5–15)
BUN: 34 mg/dL — ABNORMAL HIGH (ref 8–23)
CO2: 27 mmol/L (ref 22–32)
Calcium: 8.2 mg/dL — ABNORMAL LOW (ref 8.9–10.3)
Chloride: 96 mmol/L — ABNORMAL LOW (ref 98–111)
Creatinine, Ser: 1.24 mg/dL — ABNORMAL HIGH (ref 0.44–1.00)
GFR, Estimated: 48 mL/min — ABNORMAL LOW (ref >60)
Glucose, Bld: 142 mg/dL — ABNORMAL HIGH (ref 70–99)
Potassium: 2.8 mmol/L — ABNORMAL LOW (ref 3.5–5.1)
Sodium: 134 mmol/L — ABNORMAL LOW (ref 135–145)

## 2022-03-18 LAB — MAGNESIUM: Magnesium: 2 mg/dL (ref 1.7–2.4)

## 2022-03-18 LAB — BRAIN NATRIURETIC PEPTIDE: B Natriuretic Peptide: 1799 pg/mL — ABNORMAL HIGH (ref 0.0–100.0)

## 2022-03-18 MED ORDER — POTASSIUM CHLORIDE CRYS ER 20 MEQ PO TBCR
30.0000 meq | EXTENDED_RELEASE_TABLET | Freq: Two times a day (BID) | ORAL | Status: AC
  Administered 2022-03-18 (×2): 30 meq via ORAL
  Filled 2022-03-18 (×2): qty 1

## 2022-03-18 MED ORDER — POTASSIUM CHLORIDE 10 MEQ/100ML IV SOLN
10.0000 meq | INTRAVENOUS | Status: AC
  Administered 2022-03-18 (×2): 10 meq via INTRAVENOUS
  Filled 2022-03-18: qty 100

## 2022-03-18 MED ORDER — MAGNESIUM SULFATE 2 GM/50ML IV SOLN
2.0000 g | Freq: Once | INTRAVENOUS | Status: AC
  Administered 2022-03-18: 2 g via INTRAVENOUS
  Filled 2022-03-18: qty 50

## 2022-03-18 NOTE — Progress Notes (Signed)
PROGRESS NOTE    Patient: Elizabeth Herring                            PCP: Pcp, No                    DOB: 07/27/1955            DOA: 03/15/2022 [id number]             DOS: 03/18/2022, 11:28 AM   LOS: 3 days   Date of Service: The patient was seen and examined on 03/18/2022  Subjective:   The patient was seen and examined this morning, stable awake alert oriented still complaining of shortness of breath at rest, reporting much improved lower extremity edema, has been diuresing really well...  Still on Lasix drip  Brief Narrative:   Elizabeth Herring is a 66 y.o. 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  Patient denies chest pain or shortness of breath at this time.  Denies nausea vomiting or diarrhea.  Denies abdominal pain.   ED events: Patient was found significantly volume overload with extensive bilateral lower extremity edema to the thigh area, BNP > 4500, troponin elevated 330, 452.  Cardiology was consulted, patient started on IV Lasix.  Also found to have elevated alk phos 219, AST 60, ALT 26, total bilirubin 5.0   Initially BiPAP was applied, BiPAP has been weaned off.  Currently on room air.   Assessment and plan:   Acute on chronic systolic and diastolic HF with exacerbation/  ICM LVEF < 20%  -Continuing shortness of breath at rest -satting 95% on 2 L oxygen  -Repeating echo: <20%. The left  ventricle has severely decreased function.,  Reduced right LV systolic, chronic 0.6 x 0.4 cm mobile echodensity attached to the pulmonic  valve leaflet with motion pattern independent of the pulmonic valve. There  is mild to moderate  pulmonary valve regurgitation.   -Per Cardiology 11/8 >>initiating 80 mg IV Lasix, >>> On  Lasix drip 10 mg/hr    Intake/Output Summary (Last 24 hours) at 03/18/2022 1124 Last data filed at 03/18/2022 1100 Gross per 24 hour  Intake 870 ml  Output 4700 ml  Net -3830 ml      -Monitoring Reds Clip , BMP, daily weights, I's and O's -Hold beta-blockers -Hold ACEi/ARB/ARNI/MRA - Continue Lisabeth Register on hold as per cardiology recommendation.    Filed Weights   03/15/22 1147 03/15/22 2201  Weight: 63.5 kg 77.6 kg    -Patient not a candidate for ICD due to GDMT noncompliance, per EP  Volume overload-bilateral lower extremity edema -secondary to above.  She also has open areas in lower extremities.  -  wound care consult for wound care management of lower extremity wounds  -Continue Lasix drip  Multivessel CAD  -Elevated troponin-likely ischemic demand -Continue aspirin 81 mg and Plavix 75 mg once daily, indefinitely -Continue atorvastatin 80 mg nightly   HLD -Continue atorvastatin 80 mg nightly -Outpatient management   Transaminitis-patient has transaminitis with increased bilirubin.  We will obtain abdominal ultrasound.  CKD stage IIIb -monitoring, improving  Lab Results  Component Value Date   CREATININE  1.24 (H) 03/18/2022   CREATININE 1.44 (H) 03/17/2022   CREATININE 1.38 (H) 03/16/2022      -creatinine 1.58, slightly above baseline of 1.21 as of March 2023.[name]  Entresto on hold.     Skin Assessment: I have examined the patient's skin and I agree with the wound assessment as performed by wound care team As outlined belowe: Pressure Injury 03/15/22 Buttocks Medial Stage 2 -  Partial thickness loss of dermis presenting as a shallow open injury with a red, pink wound bed without slough. healing st 2 (Active)  03/15/22 2215  Location: Buttocks  Location Orientation: Medial  Staging: Stage 2 -  Partial thickness loss of dermis presenting as a shallow  open injury with a red, pink wound bed without slough.  Wound Description (Comments): healing st 2  Present on Admission: Yes  Dressing Type Foam - Lift dressing to assess site every shift 03/18/22 0800     ------------------------------------------------------------------------------------------------------------------------------------------------  DVT prophylaxis:   SCDs  Code Status:   Code Status: Full Code  Family Communication: No family member present at bedside- attempt will be made to update daily The above findings and plan of care has been discussed with patient (and family)  in detail,  they expressed understanding and agreement of above. -Advance care planning has been discussed.   -Palliative care consulted to determine goals of care and CODE STATUS Progress remain poor due to call Reddys, end-stage heart failure   Admission status:   Status is: Inpatient Remains inpatient appropriate because: Needing aggressive IV medication for diuresis due to significant volume overload   Disposition: Presenting from home  Anticipating to be discharged to SNF if she is agreeable in 1-2 days   Procedures:   No admission procedures for hospital encounter.   Antimicrobials:  Anti-infectives (From admission, onward)    None        Medication:   aspirin EC  81 mg Oral Daily   atorvastatin  80 mg Oral Daily   clopidogrel  75 mg Oral Daily   dapagliflozin propanediol  10 mg Oral Q breakfast   montelukast  10 mg Oral Daily   pantoprazole  40 mg Oral Daily   potassium chloride  30 mEq Oral BID    acetaminophen **OR** acetaminophen, ondansetron **OR** ondansetron (ZOFRAN) IV   Objective:   Vitals:   03/17/22 1716 03/17/22 2013 03/18/22 0033 03/18/22 0433  BP: 117/77 121/80 121/64 126/83  Pulse: (!) 105 (!) 101 100 95  Resp: (!) 24 (!) _0 Temp: 98.3 F (36.8 C) 98 F (36.7 C) 98 F (36.7 C) 98.1 F (36.7 C)  TempSrc: Oral Axillary Oral Oral  SpO2:  97% 98% 98% 95%  Weight:      Height:        Intake/Output Summary (Last 24 hours) at 03/18/2022 1128 Last data filed at 03/18/2022 1100 Gross per 24 hour  Intake 870 ml  Output 4700 ml  Net -3830 ml   Filed Weights   03/15/22 1147 03/15/22 2201  Weight: 63.5 kg 77.6 kg     Examination:     General:  AAO x 3,  cooperative, no distress;   HEENT:  Normocephalic, PERRL, otherwise with in Normal limits   Neuro:  CNII-XII intact. , normal motor and sensation, reflexes intact   Lungs:   Clear to auscultation BL, Respirations unlabored,  No wheezes / crackles  Cardio:    S1/S2, RRR, No murmure, No Rubs or Gallops   Abdomen:  Soft, non-tender,  bowel sounds active all four quadrants, no guarding or peritoneal signs.  Muscular  skeletal:  Limited exam -global generalized weaknesses - in bed, able to move all 4 extremities,   2+ pulses,  symmetric, ++2 pitting edema  Skin:  Dry, warm to touch, negative for any Rashes,  Wounds: Please see nursing documentation  Pressure Injury 03/15/22 Buttocks Medial Stage 2 -  Partial thickness loss of dermis presenting as a shallow open injury with a red, pink wound bed without slough. healing st 2 (Active)  03/15/22 2215  Location: Buttocks  Location Orientation: Medial  Staging: Stage 2 -  Partial thickness loss of dermis presenting as a shallow open injury with a red, pink wound bed without slough.  Wound Description (Comments): healing st 2  Present on Admission: Yes  Dressing Type Foam - Lift dressing to assess site every shift 03/18/22 0800            ------------------------------------------------------------------------------------------------------------------------------------------    LABs:     Latest Ref Rng & Units 03/16/2022    4:28 AM 03/15/2022    1:07 PM 08/04/2021   12:56 PM  CBC  WBC 4.0 - 10.5 K/uL 8.5  7.8  4.3   Hemoglobin 12.0 - 15.0 g/dL 12.9  13.6  9.5   Hematocrit 36.0 - 46.0 % 38.5  41.3  28.2    Platelets 150 - 400 K/uL 173  185  174       Latest Ref Rng & Units 03/18/2022    3:24 AM 03/17/2022    3:56 AM 03/16/2022    4:28 AM  CMP  Glucose 70 - 99 mg/dL 142  105  111   BUN 8 - 23 mg/dL 34  42  43   Creatinine 0.44 - 1.00 mg/dL 1.24  1.44  1.38   Sodium 135 - 145 mmol/L 134  135  133   Potassium 3.5 - 5.1 mmol/L 2.8  4.3  4.5   Chloride 98 - 111 mmol/L 96  97  97   CO2 22 - 32 mmol/L _0 Calcium 8.9 - 10.3 mg/dL 8.2  9.1  9.1   Total Protein 6.5 - 8.1 g/dL   7.3   Total Bilirubin 0.3 - 1.2 mg/dL   4.9   Alkaline Phos 38 - 126 U/L   188   AST 15 - 41 U/L   43   ALT 0 - 44 U/L   20        Micro Results No results found for this or any previous visit (from the past 240 hour(s)).  Radiology Reports No results found.  SIGNED: Deatra James, MD, FHM. [facility],  Pager (please use [email or web address] to page/text) Please use Epic Secure Chat for non-urgent communication (7AM-7PM)  If 7PM-7AM, please contact night-coverage [email or web address], 03/18/2022, 11:28 AM

## 2022-03-18 NOTE — Plan of Care (Signed)
  Problem: Acute Rehab OT Goals (only OT should resolve) Goal: Pt. Will Perform Grooming Flowsheets (Taken 03/18/2022 1403) Pt Will Perform Grooming:  sitting  with min guard assist Goal: Pt. Will Perform Upper Body Bathing Flowsheets (Taken 03/18/2022 1403) Pt Will Perform Upper Body Bathing:  with supervision  sitting Goal: Pt. Will Perform Lower Body Bathing Flowsheets (Taken 03/18/2022 1403) Pt Will Perform Lower Body Bathing:  with min assist  sitting/lateral leans  with mod assist Goal: Pt. Will Perform Upper Body Dressing Flowsheets (Taken 03/18/2022 1403) Pt Will Perform Upper Body Dressing:  with supervision  sitting Goal: Pt. Will Perform Lower Body Dressing Flowsheets (Taken 03/18/2022 1403) Pt Will Perform Lower Body Dressing:  with min assist  sitting/lateral leans Goal: Pt. Will Transfer To Toilet Flowsheets (Taken 03/18/2022 1403) Pt Will Transfer to Toilet:  stand pivot transfer  with min assist  with mod assist Goal: Pt. Will Perform Toileting-Clothing Manipulation Flowsheets (Taken 03/18/2022 1403) Pt Will Perform Toileting - Clothing Manipulation and hygiene:  with min assist  sitting/lateral leans Goal: Pt/Caregiver Will Perform Home Exercise Program Flowsheets (Taken 03/18/2022 1403) Pt/caregiver will Perform Home Exercise Program:  Increased strength  Independently  Both right and left upper extremity  Khai Arrona OT, MOT

## 2022-03-18 NOTE — TOC Progression Note (Signed)
Transition of Care [facility]) - Progression Note    Patient Details  Name: Elizabeth Herring MRN: [id number] Date of Birth: [birth year]  Transition of [facility]) CM/SW Contact  Annice Needy, LCSW Phone Number: 03/18/2022, 4:22 PM  Clinical Narrative:    Patient chooses bed at [facility]. Auth received. Auth details: approved 11/10 - 11/14, next review 11/14, Vesta Mixer id [id number], plan auth id [id number]     Expected Discharge Plan: Skilled Nursing Facility Barriers to Discharge: Continued Medical Work up  Expected Discharge Plan and Services Expected Discharge Plan: Skilled Nursing Facility       Living arrangements for the past 2 months: Single Family Home                                       Social Determinants of Health (SDOH) Interventions    Readmission Risk Interventions     No data to display

## 2022-03-18 NOTE — Progress Notes (Signed)
Progress Note  Patient Name: Elizabeth Herring Date of Encounter: 03/18/2022  Primary Cardiologist: Ida Rogue, MD  Subjective   Patient stated that she is feeling better compared to yesterday. She did not get good sleep last night because the pillow was moving and some interrupted sleep. Patient clearly stated that she does not want to be transferred to [facility] in case she had to be started on inotropes.  Intake/Output Summary (Last 24 hours) at 03/18/2022 0918 Last data filed at 03/18/2022 0500 Gross per 24 hour  Intake 630 ml  Output 3700 ml  Net -3070 ml     Inpatient Medications    Scheduled Meds:  aspirin EC  81 mg Oral Daily   atorvastatin  80 mg Oral Daily   clopidogrel  75 mg Oral Daily   dapagliflozin propanediol  10 mg Oral Q breakfast   montelukast  10 mg Oral Daily   pantoprazole  40 mg Oral Daily   potassium chloride  30 mEq Oral BID   Continuous Infusions:  furosemide (LASIX) 200 mg in dextrose 5 % 100 mL (2 mg/mL) infusion 15 mg/hr (03/17/22 1415)   magnesium sulfate bolus IVPB     potassium chloride     PRN Meds: acetaminophen **OR** acetaminophen, ondansetron **OR** ondansetron (ZOFRAN) IV   Vital Signs    Vitals:   03/17/22 1716 03/17/22 2013 03/18/22 0033 03/18/22 0433  BP: 117/77 121/80 121/64 126/83  Pulse: (!) 105 (!) 101 100 95  Resp: (!) 24 (!) 22 18 20   Temp: 98.3 F (36.8 C) 98 F (36.7 C) 98 F (36.7 C) 98.1 F (36.7 C)  TempSrc: Oral Axillary Oral Oral  SpO2: 97% 98% 98% 95%  Weight:      Height:        Intake/Output Summary (Last 24 hours) at 03/18/2022 0918 Last data filed at 03/18/2022 0500 Gross per 24 hour  Intake 630 ml  Output 3700 ml  Net -3070 ml   Filed Weights   03/15/22 1147 03/15/22 2201  Weight: 63.5 kg 77.6 kg    Telemetry     Personally reviewed, HR controlled  ECG    An ECG dated 03/15/22 was personally reviewed today and demonstrated:  NSR and RBBB  Physical Exam   GEN: No  acute distress.   Neck: JVD+ Cardiac: RRR, no murmur, rub, or gallop.  Respiratory: Labored.  GI: Soft, nontender, bowel sounds present. MS: Anasarca + Neuro:  Nonfocal. Psych: Alert and oriented x 3. Normal affect.  Labs    Chemistry Recent Labs  Lab 03/15/22 1307 03/16/22 0428 03/17/22 0356 03/18/22 0324  NA 133* 133* 135 134*  K 5.2* 4.5 4.3 2.8*  CL 96* 97* 97* 96*  CO2 24 26 28 27   GLUCOSE 87 111* 105* 142*  BUN 44* 43* 42* 34*  CREATININE 1.58* 1.38* 1.44* 1.24*  CALCIUM 9.6 9.1 9.1 8.2*  PROT 8.6* 7.3  --   --   ALBUMIN 3.6 2.9*  --   --   AST 60* 43*  --   --   ALT 26 20  --   --   ALKPHOS 219* 188*  --   --   BILITOT 5.0* 4.9*  --   --   GFRNONAA 36* 42* 40* 48*  ANIONGAP 13 10 10 11      Hematology Recent Labs  Lab 03/15/22 1307 03/16/22 0428  WBC 7.8 8.5  RBC 4.12 3.91  HGB 13.6 12.9  HCT 41.3 38.5  MCV 100.2* 98.5  MCH  33.0 33.0  MCHC 32.9 33.5  RDW 20.2* 19.9*  PLT 185 173    Cardiac Enzymes Recent Labs  Lab 03/15/22 1307 03/15/22 1513  TROPONINIHS 330* 252*    BNP Recent Labs  Lab 03/15/22 1307 03/17/22 0356 03/18/22 0324  BNP >4,500.0* 3,108.0* 1,799.0*     DDimerNo results for input(s): "DDIMER" in the last 168 hours.   Radiology    ECHOCARDIOGRAM COMPLETE  Result Date: 03/16/2022    ECHOCARDIOGRAM REPORT   Patient Name:   Elizabeth Herring Date of Exam: 03/16/2022 Medical Rec #:  [id number]             Height:       60.0 in Accession #:    [id number]            Weight:       171.1 lb Date of Birth:  [birth year]             BSA:          1.747 m Patient Age:    37 years              BP:           98/75 mmHg Patient Gender: F                     HR:           99 bpm. Exam Location:  [facility] Procedure: 2D Echo, Cardiac Doppler, Color Doppler and Intracardiac            Opacification Agent Indications:    Cardiomyopathy-Ischemic  History:        Patient has prior history of Echocardiogram examinations, most                  recent 02/11/2021. Cardiomyopathy and CHF, Previous Myocardial                 Infarction; Risk Factors:Hypertension, Current Smoker and                 Dyslipidemia.  Sonographer:    Wenda Low Referring Phys: [place]  1. Left ventricular ejection fraction, by estimation, is <20%. The left ventricle has severely decreased function. The left ventricle has no regional wall motion abnormalities. The left ventricular internal cavity size was mildly dilated. Left ventricular diastolic parameters are consistent with Grade I diastolic dysfunction (impaired relaxation). There is the interventricular septum is flattened in diastole ('D' shaped left ventricle), consistent with right ventricular volume overload. No evidence of LV thrombus.  2. Right ventricular systolic function is moderately reduced. The right ventricular size is mildly enlarged. Moderately increased right ventricular wall thickness. Unable to calculate PASP due to severe TR.  3. The tricuspid valve is abnormal. Tricuspid valve regurgitation is severe.  4. There is a 0.6 x 0.4 cm mobile echodensity attached to the pulmonic valve leaflet with motion pattern independent of the pulmonic valve. There is mild to moderate pulmonary valve regurgitation. Differential include fibroelastoma, calcified valve, vegetation, thrombus. Clinical correlation is recommended.  5. The inferior vena cava is dilated in size with <50% respiratory variability, suggesting right atrial pressure of 15 mmHg.  6. Large pleural effusion in both left and right lateral regions. Comparison(s): The mobile echodensity on pulmonic valve was present on prior Echo from 02/2021. FINDINGS  Left Ventricle: Left ventricular ejection fraction, by estimation, is <20%. The left ventricle has severely decreased function. The left ventricle has no regional wall  motion abnormalities. Definity contrast agent was given IV to delineate the left ventricular endocardial borders. The  left ventricular internal cavity size was mildly dilated. There is no left ventricular hypertrophy. The interventricular septum is flattened in diastole ('D' shaped left ventricle), consistent with right ventricular volume overload. Left ventricular diastolic parameters are consistent with Grade I diastolic dysfunction (impaired relaxation).  LV Wall Scoring: There is diffuse hypokinesis. Right Ventricle: The right ventricular size is mildly enlarged. Moderately increased right ventricular wall thickness. Right ventricular systolic function is moderately reduced. The tricuspid regurgitant velocity is 1.96 m/s, and with an assumed right atrial pressure of 15 mmHg, the estimated right ventricular systolic pressure is 30.4 mmHg. Left Atrium: Left atrial size was mildly dilated. Right Atrium: Right atrial size was severely dilated. Pericardium: There is no evidence of pericardial effusion. Mitral Valve: The mitral valve is abnormal. Severely decreased mobility of the mitral valve leaflets. Trivial mitral valve regurgitation. No evidence of mitral valve stenosis. MV peak gradient, 8.9 mmHg. The mean mitral valve gradient is 4.0 mmHg. Tricuspid Valve: The tricuspid valve is abnormal. Tricuspid valve regurgitation is severe. No evidence of tricuspid stenosis. Aortic Valve: The aortic valve is tricuspid. Aortic valve regurgitation is not visualized. No aortic stenosis is present. Aortic valve mean gradient measures 3.0 mmHg. Aortic valve peak gradient measures 5.3 mmHg. Aortic valve area, by VTI measures 1.67 cm. Pulmonic Valve: There is a 0.6 x 0.4 cm mobile echodensity attached to the pulmonic valve leaflet with motion pattern independent of the pulmonic valve. Differential include fibroelastoma, calcified valve, vegetation, thrombus. Clinical correlation is recommended. Pulmonic valve regurgitation is mild to moderate. No evidence of pulmonic stenosis. Aorta: The aortic root is normal in size and structure. Venous: The  inferior vena cava is dilated in size with less than 50% respiratory variability, suggesting right atrial pressure of 15 mmHg. IAS/Shunts: There is left bowing of the interatrial septum, suggestive of elevated right atrial pressure. No atrial level shunt detected by color flow Doppler. Additional Comments: There is a large pleural effusion in both left and right lateral regions. Mild ascites is present.  LEFT VENTRICLE PLAX 2D LVIDd:         6.00 cm LVIDs:         5.80 cm LV PW:         1.00 cm LV IVS:        1.00 cm LVOT diam:     1.90 cm LV SV:         32 LV SV Index:   18 LVOT Area:     2.84 cm  LV Volumes (MOD) LV vol d, MOD A4C: 112.0 ml LV SV MOD A4C:     112.0 ml RIGHT VENTRICLE RV Basal diam:  3.65 cm RV Mid diam:    3.40 cm RV S prime:     8.15 cm/s TAPSE (M-mode): 1.5 cm LEFT ATRIUM             Index        RIGHT ATRIUM           Index LA diam:        4.60 cm 2.63 cm/m   RA Area:     31.90 cm LA Vol (A2C):   77.0 ml 44.08 ml/m  RA Volume:   124.00 ml 70.99 ml/m LA Vol (A4C):   63.8 ml 36.53 ml/m LA Biplane Vol: 69.5 ml 39.79 ml/m  AORTIC VALVE  PULMONIC VALVE AV Area (Vmax):    2.31 cm     PV Vmax:       1.05 m/s AV Area (Vmean):   1.92 cm     PV Peak grad:  4.4 mmHg AV Area (VTI):     1.67 cm AV Vmax:           115.00 cm/s AV Vmean:          85.400 cm/s AV VTI:            0.192 m AV Peak Grad:      5.3 mmHg AV Mean Grad:      3.0 mmHg LVOT Vmax:         93.80 cm/s LVOT Vmean:        57.800 cm/s LVOT VTI:          0.113 m LVOT/AV VTI ratio: 0.59  AORTA Ao Root diam: 2.60 cm MITRAL VALVE                TRICUSPID VALVE MV Area (PHT): 5.62 cm     TR Peak grad:   15.4 mmHg MV Area VTI:   1.15 cm     TR Vmax:        196.00 cm/s MV Peak grad:  8.9 mmHg MV Mean grad:  4.0 mmHg     SHUNTS MV Vmax:       1.49 m/s     Systemic VTI:  0.11 m MV Vmean:      88.9 cm/s    Systemic Diam: 1.90 cm MV Decel Time: 135 msec MV E velocity: 84.40 cm/s MV A velocity: 126.00 cm/s MV E/A ratio:  0.67  Alzora Ha Priya Shawnee Higham Electronically signed by Lorelee Cover Sagal Gayton Signature Date/Time: 03/16/2022/2:35:24 PM    Final     Cardiac Studies  Echo on 03/16/2022 LVEF <20%  [facility] in 2022 Prox RCA lesion is 100% stenosed. CTO Mid LM lesion is 25% stenosed. Dist LAD lesion is 95% stenosed. Prox LAD to Dist LAD lesion is 75% stenosed. Dist LM to Prox LAD lesion is 50% stenosed. Prox Cx to Mid Cx lesion is 100% stenosed. In-stent thrombosis of the stent IRA TIMI 0 flow Moderate collaterals left to right left to left Left ventricular function severely depressed left ventricular enlargement EF between 25 and 30% globally Unsuccessful attempted PCI and stent to ostial old stent to the circumflex with in-stent thrombosis failure to cross with a wire  Assessment & Plan    Patient is a 66 year old F known to have multivessel CAD (RCA CTO, LAD 75 to 95%, LCx ISR CTO with moderate collaterals L to R and L to L) with LVEF less than 20% and no device (due to GDMT noncompliance, per EP), ICM LVEF less than 20% with no device, HLD was admitted to the hospitalist team for the management of ADHF.  #Acute systolic and diastolic heart failure exacerbation #ICM LVEF less than 20% with no device Plan -Continue Lasix drip 15 mg/h. If patient does not respond to the current dose, can increase to 20 mg/h.  If patient does not respond to 20 mg/h, she will need inotropic support however she refused to be transferred to [facility] if she required inotropes in the future. -Hold beta-blockers -Hold ACEi/ARB/ARNI/MRA -Patient will be reintroduced to GDMT after she achieves near euvolemic status -Patient is not a candidate for ICD due to GDMT noncompliance, per EP -Follow-up palliative care recommendations  #Echodensity on the pulmonary valve leaflet resulting in mild to moderate  pulmonary valve regurgitation Plan -Patient currently does not have signs and symptoms of infection, infective endocarditis would be very  low in the differential. This echodensity was also seen on the prior echo but the size was small or not well visualized and the regurgitation was mild. It does not appear to be a thrombus either as echodensity looks calcified. We will monitor for now.  # Multivessel CAD (RCA CTO, LAD 75 to 95%, LCx ISR CTO with moderate collaterals L to R and L to L)  Plan -Continue aspirin 81 mg and Plavix 75 mg once daily, indefinitely -Continue atorvastatin 80 mg nightly  #HLD Plan -Continue atorvastatin 80 mg nightly -Outpatient management   I have spent a total of 35 minutes with patient reviewing chart , telemetry, EKGs, labs and examining patient as well as establishing an assessment and plan that was discussed with the patient.  > 50% of time was spent in direct patient care.      Signed, Chalmers Guest, MD  03/18/2022, 9:18 AM

## 2022-03-18 NOTE — Evaluation (Signed)
Occupational Therapy Evaluation Patient Details Name: Elizabeth Herring MRN: [id number] DOB: [birth year] Today's Date: 03/18/2022   History of Present Illness Elizabeth Herring  is a 66 y.o. 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 Admitted 03/15/22 for acute exacerbation of CHF.   Clinical Impression   Pt agreeable to OT evaluation with PT also treating. Pt presented with oxygen moved slightly to the side. Reported that her nose was feeling dry. Pt initially reported no pain but with assisted movement of B LE pt would grimace and clearly be in distress. Pt was able to demonstrate [facility] A/ROM while supine in bed with head of bed elevated. Supine to sit bed mobility attempted but pt was in too much pain to achieve EOB sitting. Pt was able to achieve long sit in bed but quickly had to return to supine due to pain. Pt was able to complete face washing with set up assist. Pt limited by pain and severe edema at this time. Pt left in bed with call bell within reach. Pt will benefit from continued OT in the hospital and recommended venue below to increase strength, balance, and endurance for safe ADL's.        Recommendations for follow up therapy are one component of a multi-disciplinary discharge planning process, led by the attending physician.  Recommendations may be updated based on patient status, additional functional criteria and insurance authorization.   Follow Up Recommendations  Skilled nursing-short term rehab (<3 hours/day)    Assistance Recommended at Discharge Frequent or constant Supervision/Assistance  Patient can return home with the following A lot of  help with walking and/or transfers;A lot of help with bathing/dressing/bathroom;Assistance with cooking/housework;Assist for transportation;Help with stairs or ramp for entrance    Functional Status Assessment  Patient has had a recent decline in their functional status and demonstrates the ability to make significant improvements in function in a reasonable and predictable amount of time.  Equipment Recommendations  None recommended by OT           Precautions / Restrictions Precautions Precautions: Fall;Other (comment) Precaution Comments: Pressur redistribution pad when OOB Required Braces or Orthoses: Other Brace Other Brace: Prevalon boots and pressure redistribution pad Restrictions Weight Bearing Restrictions: No      Mobility Bed Mobility Overal bed mobility: Needs Assistance             General bed mobility comments: Attempted to sit at EOB from supine position with HOB elevated. With assist pt had legs moved towards the edge of beed and was able to pull to partial long sit, but pt could not tolerate anymore and went back to supine.    Transfers                   General transfer comment: Unable to attempt due to difficulties with bed mobility.      Balance       Sitting balance - Comments: Unable to reach EOB sitting today.                                   ADL either performed or assessed with clinical judgement   ADL Overall  ADL's : Needs assistance/impaired     Grooming: Wash/dry face;Set up;Bed level Grooming Details (indicate cue type and reason): Pt able to wash face with a wash cloth while in bed with HOB elevated. Upper Body Bathing: Minimal assistance;Moderate assistance;Sitting   Lower Body Bathing: Total assistance;Bed level   Upper Body Dressing : Moderate assistance;Sitting;Minimal assistance   Lower Body Dressing: Bed level;Total assistance   Toilet Transfer: Maximal assistance;Total assistance   Toileting-  Clothing Manipulation and Hygiene: Maximal assistance;Sitting/lateral lean         General ADL Comments: Pt only able to complete grooming task due to difficulty coming to EOB due to pain in B LE during attempted mobility.     Vision Baseline Vision/History: 1 Wears glasses Ability to See in Adequate Light: 0 Adequate Patient Visual Report: No change from baseline Vision Assessment?: No apparent visual deficits                Pertinent Vitals/Pain Pain Assessment Pain Assessment: Faces Faces Pain Scale: Hurts whole lot Pain Location: B LE with assisted movement Pain Descriptors / Indicators: Grimacing Pain Intervention(s): Limited activity within patient's tolerance, Monitored during session, Repositioned     Hand Dominance Right   Extremity/Trunk Assessment Upper Extremity Assessment Upper Extremity Assessment: Generalized weakness   Lower Extremity Assessment Lower Extremity Assessment: Defer to PT evaluation       Communication Communication Communication: No difficulties   Cognition Arousal/Alertness: Awake/alert Behavior During Therapy: WFL for tasks assessed/performed Overall Cognitive Status: Within Functional Limits for tasks assessed                                                        Home Living Family/patient expects to be discharged to:: Private residence Living Arrangements: Alone   Type of Home: House Home Access: Level entry     Home Layout: One level     Bathroom Shower/Tub: Teacher, early years/pre: Standard     Home Equipment: None          Prior Functioning/Environment Prior Level of Function : Independent/Modified Independent             Mobility Comments: Patient reports full independence prior to exacerbation and admission. Could ambulate without AD in the house (per PT) ADLs Comments: Independent for ADL's and IADL's prior.        OT Problem List: Decreased strength;Decreased  activity tolerance;Impaired balance (sitting and/or standing);Pain;Increased edema      OT Treatment/Interventions: Self-care/ADL training;Therapeutic exercise;Therapeutic activities;Patient/family education;Balance training    OT Goals(Current goals can be found in the care plan section) Acute Rehab OT Goals Patient Stated Goal: Get stronger at rehab. OT Goal Formulation: With patient Time For Goal Achievement: 04/01/22 Potential to Achieve Goals: Good  OT Frequency: Min 2X/week    Co-evaluation PT/OT/SLP Co-Evaluation/Treatment: Yes Reason for Co-Treatment: Complexity of the patient's impairments (multi-system involvement)   OT goals addressed during session: ADL's and self-care                       End of Session Equipment Utilized During Treatment: Rolling walker (2 wheels)  Activity Tolerance: Patient limited by pain Patient left: in bed;with call bell/phone within reach  OT Visit Diagnosis: Unsteadiness on feet (R26.81);Other abnormalities of gait and mobility (R26.89);Muscle weakness (generalized) (M62.81);Pain Pain - Right/Left:  (  bilateral) Pain - part of body: Leg;Ankle and joints of foot                Time: 7616-0737 OT Time Calculation (min): 19 min Charges:  OT General Charges $OT Visit: 1 Visit OT Evaluation $OT Eval Low Complexity: 1 Low  Juan Olthoff OT, MOT   Danie Chandler 03/18/2022, 2:00 PM

## 2022-03-18 NOTE — Care Management Important Message (Signed)
Important Message  Patient Details  Name: Elizabeth Herring MRN: [id number] Date of Birth: [birth year]   Medicare Important Message Given:  Yes     Corey Harold 03/18/2022, 9:56 AM

## 2022-03-18 NOTE — Progress Notes (Signed)
Pt slept through the night. Vitals stable, tele called and reported NSR with frequent PVCs. Dressing changed on bilateral lower extremities and on sacrum. Minimal purulent drainage noted with foul odor present. +3 edema in bilateral upper and lower extremities.

## 2022-03-18 NOTE — Progress Notes (Signed)
Physical Therapy Treatment Patient Details Name: Elizabeth Herring MRN: [id number] DOB: 19-Mar-1956 Today's Date: 03/18/2022   History of Present Illness Elizabeth Herring  is a 66 y.o. 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 Admitted 03/15/22 for acute exacerbation of CHF.    PT Comments    Assisted patient with LE mobility with attempt to pull to long sitting EOB. Made several attempts to transition to seated but patient limited by pain and LE edema. Spoke with RN about adding additional LE bandaging/compression to aid wound healing and reduce edema/weeping. Patient will benefit from continued skilled physical therapy in hospital and recommended venue below to increase strength, balance, endurance for safe ADLs and gait.    Recommendations for follow up therapy are one component of a multi-disciplinary discharge planning process, led by the attending physician.  Recommendations may be updated based on patient status, additional functional criteria and insurance authorization.  Follow Up Recommendations  Skilled nursing-short term rehab (<3 hours/day) Can patient physically be transported by private vehicle: No   Assistance Recommended at Discharge Frequent or constant Supervision/Assistance  Patient can return home with the following Two people to help with walking and/or transfers;Two people to help with bathing/dressing/bathroom;Assist for transportation;Help with stairs or ramp for entrance;Assistance with cooking/housework   Equipment Recommendations  None recommended by PT    Recommendations for Other Services       Precautions / Restrictions  Precautions Precautions: Fall;Other (comment) Precaution Comments: Pressur redistribution pad when OOB Required Braces or Orthoses: Other Brace Other Brace: Prevalon boots and pressure redistribution pad Restrictions Weight Bearing Restrictions: No     Mobility  Bed Mobility Overal bed mobility: Needs Assistance             General bed mobility comments: Attempted to sit at EOB from supine position with HOB elevated. With assist pt had legs moved towards the edge of beed and was able to pull to partial long sit, but pt could not tolerate anymore and went back to supine.    Transfers                   General transfer comment: Unable to attempt due to difficulties with bed mobility.    Ambulation/Gait                   Stairs             Wheelchair Mobility    Modified Rankin (Stroke Patients Only)       Balance       Sitting balance - Comments: Unable to reach EOB sitting today.                                    Cognition Arousal/Alertness: Awake/alert Behavior During Therapy: WFL for tasks assessed/performed Overall Cognitive Status: Within Functional Limits for tasks assessed                                          Exercises      General Comments  Pertinent Vitals/Pain Pain Assessment Pain Assessment: Faces Faces Pain Scale: Hurts whole lot Pain Location: B LE with assisted movement Pain Descriptors / Indicators: Grimacing Pain Intervention(s): Limited activity within patient's tolerance, Monitored during session, Repositioned    Home Living Family/patient expects to be discharged to:: Private residence Living Arrangements: Alone   Type of Home: House Home Access: Level entry       Home Layout: One level Home Equipment: None      Prior Function            PT Goals (current goals can now be found in the care plan section) Acute Rehab PT Goals Patient Stated Goal: Get  rehab before going home PT Goal Formulation: With patient Time For Goal Achievement: 03/30/22 Potential to Achieve Goals: Fair Progress towards PT goals: Progressing toward goals    Frequency    Min 3X/week      PT Plan Current plan remains appropriate    Co-evaluation PT/OT/SLP Co-Evaluation/Treatment: Yes Reason for Co-Treatment: Complexity of the patient's impairments (multi-system involvement) PT goals addressed during session: Strengthening/ROM;Mobility/safety with mobility OT goals addressed during session: ADL's and self-care      AM-PAC PT "6 Clicks" Mobility   Outcome Measure  Help needed turning from your back to your side while in a flat bed without using bedrails?: A Lot Help needed moving from lying on your back to sitting on the side of a flat bed without using bedrails?: A Lot Help needed moving to and from a bed to a chair (including a wheelchair)?: A Lot Help needed standing up from a chair using your arms (e.g., wheelchair or bedside chair)?: Total Help needed to walk in hospital room?: Total Help needed climbing 3-5 steps with a railing? : Total 6 Click Score: 9    End of Session Equipment Utilized During Treatment: Gait belt Activity Tolerance: Patient limited by pain Patient left: in bed;with call bell/phone within reach Nurse Communication: Mobility status PT Visit Diagnosis: Muscle weakness (generalized) (M62.81);Difficulty in walking, not elsewhere classified (R26.2);Pain Pain - Right/Left: Left Pain - part of body: Leg;Ankle and joints of foot     Time: 7062-3762 PT Time Calculation (min) (ACUTE ONLY): 19 min  Charges:  $Therapeutic Activity: 8-22 mins                     2:15 PM, 03/18/22 Wyman Songster PT, DPT Physical Therapist at [facility]

## 2022-03-19 DIAGNOSIS — I5043 Acute on chronic combined systolic (congestive) and diastolic (congestive) heart failure: Secondary | ICD-10-CM | POA: Diagnosis not present

## 2022-03-19 LAB — BASIC METABOLIC PANEL
Anion gap: 10 (ref 5–15)
BUN: 29 mg/dL — ABNORMAL HIGH (ref 8–23)
CO2: 31 mmol/L (ref 22–32)
Calcium: 7.9 mg/dL — ABNORMAL LOW (ref 8.9–10.3)
Chloride: 94 mmol/L — ABNORMAL LOW (ref 98–111)
Creatinine, Ser: 1.21 mg/dL — ABNORMAL HIGH (ref 0.44–1.00)
GFR, Estimated: 50 mL/min — ABNORMAL LOW (ref >60)
Glucose, Bld: 104 mg/dL — ABNORMAL HIGH (ref 70–99)
Potassium: 3.6 mmol/L (ref 3.5–5.1)
Sodium: 135 mmol/L (ref 135–145)

## 2022-03-19 LAB — BRAIN NATRIURETIC PEPTIDE: B Natriuretic Peptide: 896 pg/mL — ABNORMAL HIGH (ref 0.0–100.0)

## 2022-03-19 MED ORDER — ALBUMIN HUMAN 25 % IV SOLN
25.0000 g | Freq: Once | INTRAVENOUS | Status: AC
  Administered 2022-03-19: 25 g via INTRAVENOUS
  Filled 2022-03-19: qty 100

## 2022-03-19 NOTE — Progress Notes (Signed)
PROGRESS NOTE    Patient: Elizabeth Herring                            PCP: Pcp, No                    DOB: 1956/01/12            DOA: 03/15/2022 [id number]             DOS: 03/19/2022, 12:13 PM   LOS: 4 days   Date of Service: The patient was seen and examined on 03/19/2022  Subjective:   The patient was seen and examined this morning, stable reporting improved shortness of breath, denies any chest pain, Satting 100% on 2 L of oxygen Diuresing well - still on Lasix drip  Brief Narrative:   Elizabeth Herring is a 66 y.o. 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  Patient denies chest pain or shortness of breath at this time.  Denies nausea vomiting or diarrhea.  Denies abdominal pain.   ED events: Patient was found significantly volume overload with extensive bilateral lower extremity edema to the thigh area, BNP > 4500, troponin elevated 330, 452.  Cardiology was consulted, patient started on IV Lasix.  Also found to have elevated alk phos 219, AST 60, ALT 26, total bilirubin 5.0   Initially BiPAP was applied, BiPAP has been weaned off.  Currently on room air.   Assessment and plan:   Acute on chronic systolic and diastolic HF with exacerbation/  ICM LVEF < 20%  -Continuing shortness of breath at rest -satting 95% on 2 L oxygen  -Repeating echo: <20%. The left  ventricle has severely decreased function.,  Reduced right LV systolic, chronic 0.6 x 0.4 cm mobile echodensity attached to the pulmonic  valve leaflet with motion pattern independent of the pulmonic valve. There  is mild to moderate pulmonary valve regurgitation.   We will  continue..   -Per Cardiology 11/8 >>initiating 80 mg IV Lasix, >>> On  Lasix drip 10 mg/hr  I/O last 3 completed shifts: In: 1436.3 [P.O.:1080; I.V.:156.3; IV Piggyback:200] Out: 4100 [Urine:4100] No intake/output data recorded.   Intake/Output Summary (Last 24 hours) at 03/19/2022 1200 Last data filed at 03/19/2022 0517 Gross per 24 hour  Intake 956.3 ml  Output 600 ml  Net 356.3 ml   Forker's care to help   -Monitoring Reds Clip , BMP, daily weights, I's and O's -Hold beta-blockers -Hold ACEi/ARB/ARNI/MRA - Continue Lisabeth Register on hold as per cardiology recommendation.   Filed Weights   03/15/22 1147 03/15/22 2201 03/19/22 0519  Weight: 63.5 kg 77.6 kg 74.8 kg      -Patient not a candidate for ICD due to GDMT noncompliance, per EP  Volume overload-bilateral lower extremity edema -secondary to above.  She also has open areas in lower extremities.  -  wound care consult for wound care management of lower extremity wounds  -Continue Lasix drip  Multivessel CAD  -Elevated troponin-likely ischemic demand -Continue aspirin 81 mg and Plavix 75 mg once daily, indefinitely -Continue atorvastatin 80 mg nightly   HLD -Continue atorvastatin 80 mg nightly -Outpatient management   Transaminitis-patient  has transaminitis with increased bilirubin.  We will obtain abdominal ultrasound.  CKD stage IIIb -monitoring, improving  Lab Results  Component Value Date   CREATININE 1.21 (H) 03/19/2022   CREATININE 1.24 (H) 03/18/2022   CREATININE 1.44 (H) 03/17/2022   -creatinine 1.58, slightly above baseline of 1.21 as of March 2023.[name]  Entresto on hold.    Hypoalbuminemia: Repleting albumin IV    Skin Assessment: I have examined the patient's skin and I agree with the wound assessment as performed by wound care team As outlined belowe: Pressure Injury 03/15/22 Buttocks Medial Stage 2 -  Partial thickness loss of dermis presenting as a shallow open injury with a  red, pink wound bed without slough. healing st 2 (Active)  03/15/22 2215  Location: Buttocks  Location Orientation: Medial  Staging: Stage 2 -  Partial thickness loss of dermis presenting as a shallow open injury with a red, pink wound bed without slough.  Wound Description (Comments): healing st 2  Present on Admission: Yes  Dressing Type Foam - Lift dressing to assess site every shift 03/18/22 2009     ------------------------------------------------------------------------------------------------------------------------------------------------  DVT prophylaxis:   SCDs  Code Status:   Code Status: Full Code  Family Communication: No family member present at bedside- attempt will be made to update daily The above findings and plan of care has been discussed with patient (and family)  in detail,  they expressed understanding and agreement of above. -Advance care planning has been discussed.   -Palliative care consulted to determine goals of care and CODE STATUS Progress remain poor due to call Reddys, end-stage heart failure   Admission status:   Status is: Inpatient Remains inpatient appropriate because: Needing aggressive IV medication for diuresis due to significant volume overload   Disposition: Presenting from home  Anticipating to be discharged to [facility]  Monday 04/07/2022   Procedures:   No admission procedures for hospital encounter.   Antimicrobials:  Anti-infectives (From admission, onward)    None        Medication:   aspirin EC  81 mg Oral Daily   atorvastatin  80 mg Oral Daily   clopidogrel  75 mg Oral Daily   dapagliflozin propanediol  10 mg Oral Q breakfast   montelukast  10 mg Oral Daily   pantoprazole  40 mg Oral Daily    acetaminophen **OR** acetaminophen, ondansetron **OR** ondansetron (ZOFRAN) IV   Objective:   Vitals:   03/18/22 1300 03/18/22 2058 03/19/22 0506 03/19/22 0519  BP: 103/73 (!) 130/93 114/67   Pulse: 95 100 100    Resp: _0 Temp: 97.6 F (36.4 C) 97.7 F (36.5 C) 98 F (36.7 C)   TempSrc: Oral  Oral   SpO2: 98% 99% 100%   Weight:    74.8 kg  Height:        Intake/Output Summary (Last 24 hours) at 03/19/2022 1213 Last data filed at 03/19/2022 0517 Gross per 24 hour  Intake 956.3 ml  Output 600 ml  Net 356.3 ml   Filed Weights   03/15/22 1147 03/15/22 2201 03/19/22 0519  Weight: 63.5 kg 77.6 kg 74.8 kg     Examination:      Physical Exam:   General:  AAO x 3,  cooperative, no distress;   HEENT:  Normocephalic, PERRL, otherwise with in Normal limits   Neuro:  CNII-XII intact. , normal motor and sensation, reflexes intact   Lungs:   Clear to auscultation BL, Respirations unlabored,  No wheezes /  crackles  Cardio:    S1/S2, RRR, No murmure, No Rubs or Gallops   Abdomen:  Soft, non-tender, bowel sounds active all four quadrants, no guarding or peritoneal signs.  Muscular  skeletal:  Limited exam -global generalized weaknesses - in bed, able to move all 4 extremities,   2+ pulses,  symmetric, No pitting edema  Skin:  Dry, warm to touch, negative for any Rashes,  Wounds: Please see nursing documentation  Pressure Injury 03/15/22 Buttocks Medial Stage 2 -  Partial thickness loss of dermis presenting as a shallow open injury with a red, pink wound bed without slough. healing st 2 (Active)  03/15/22 2215  Location: Buttocks  Location Orientation: Medial  Staging: Stage 2 -  Partial thickness loss of dermis presenting as a shallow open injury with a red, pink wound bed without slough.  Wound Description (Comments): healing st 2  Present on Admission: Yes  Dressing Type Foam - Lift dressing to assess site every shift 03/18/22 2009                 ------------------------------------------------------------------------------------------------------------------------------------------    LABs:     Latest Ref Rng & Units 03/16/2022    4:28 AM 03/15/2022    1:07 PM  08/04/2021   12:56 PM  CBC  WBC 4.0 - 10.5 K/uL 8.5  7.8  4.3   Hemoglobin 12.0 - 15.0 g/dL 12.9  13.6  9.5   Hematocrit 36.0 - 46.0 % 38.5  41.3  28.2   Platelets 150 - 400 K/uL 173  185  174       Latest Ref Rng & Units 03/19/2022    5:25 AM 03/18/2022    3:24 AM 03/17/2022    3:56 AM  CMP  Glucose 70 - 99 mg/dL 104  142  105   BUN 8 - 23 mg/dL 29  34  42   Creatinine 0.44 - 1.00 mg/dL 1.21  1.24  1.44   Sodium 135 - 145 mmol/L 135  134  135   Potassium 3.5 - 5.1 mmol/L 3.6  2.8  4.3   Chloride 98 - 111 mmol/L 94  96  97   CO2 22 - 32 mmol/L _0 Calcium 8.9 - 10.3 mg/dL 7.9  8.2  9.1        Micro Results No results found for this or any previous visit (from the past 240 hour(s)).  Radiology Reports No results found.  SIGNED: Deatra James, MD, FHM. [facility],  Pager (please use [email or web address] to page/text) Please use Epic Secure Chat for non-urgent communication (7AM-7PM)  If 7PM-7AM, please contact night-coverage [email or web address], 03/19/2022, 12:13 PM

## 2022-03-19 NOTE — Progress Notes (Signed)
Patient slept most of th night during this shift. Patient is being monitored by centralized telemetry department which reported the patient having a NSR with multiple PVC's. The patient still has 2+ edema to bilateral lower extremities and 1+ to upper extremities. Dressing changed on lower extremities during shift as ordered.

## 2022-03-20 DIAGNOSIS — I5043 Acute on chronic combined systolic (congestive) and diastolic (congestive) heart failure: Secondary | ICD-10-CM | POA: Diagnosis not present

## 2022-03-20 LAB — BASIC METABOLIC PANEL
Anion gap: 8 (ref 5–15)
BUN: 29 mg/dL — ABNORMAL HIGH (ref 8–23)
CO2: 29 mmol/L (ref 22–32)
Calcium: 7.2 mg/dL — ABNORMAL LOW (ref 8.9–10.3)
Chloride: 92 mmol/L — ABNORMAL LOW (ref 98–111)
Creatinine, Ser: 1.22 mg/dL — ABNORMAL HIGH (ref 0.44–1.00)
GFR, Estimated: 49 mL/min — ABNORMAL LOW (ref >60)
Glucose, Bld: 135 mg/dL — ABNORMAL HIGH (ref 70–99)
Potassium: 3.5 mmol/L (ref 3.5–5.1)
Sodium: 129 mmol/L — ABNORMAL LOW (ref 135–145)

## 2022-03-20 MED ORDER — TORSEMIDE 20 MG PO TABS
40.0000 mg | ORAL_TABLET | Freq: Two times a day (BID) | ORAL | Status: DC
  Administered 2022-03-20 (×2): 40 mg via ORAL
  Filled 2022-03-20 (×2): qty 2

## 2022-03-20 NOTE — Progress Notes (Signed)
Patient's bed has been alarming since start of shift. Called the service number on the bed and received a confirmation number [phone] and they stated somebody would come to switch out the beds.

## 2022-03-20 NOTE — Progress Notes (Signed)
Patient rested through the night. No prn medication given during shift. Patient is being monitored by centralized telemetry department  and reported NSR with multiple PVS's. Patient doesn't appear to be in any distress at this time.

## 2022-03-20 NOTE — Progress Notes (Signed)
   03/20/22 2027  Assess: MEWS Score  Temp (!) 100.5 F (38.1 C)  BP (!) 94/50  MAP (mmHg) (!) 64  Pulse Rate 94  SpO2 95 %  O2 Device Room Air  Assess: MEWS Score  MEWS Temp 1  MEWS Systolic 1  MEWS Pulse 0  MEWS RR 0  MEWS LOC 0  MEWS Score 2  MEWS Score Color Yellow  Assess: if the MEWS score is Yellow or Red  Were vital signs taken at a resting state? Yes  Focused Assessment No change from prior assessment  Does the patient meet 2 or more of the SIRS criteria? No  MEWS guidelines implemented *See Row Information* No, vital signs rechecked  Treat  MEWS Interventions Other (Comment) (adjusted temp in room)  Escalate  MEWS: Escalate Yellow: discuss with charge nurse/RN and consider discussing with provider and RRT  Notify: Charge Nurse/RN  Name of Charge Nurse/RN Notified Latina  Date Charge Nurse/RN Notified 03/20/22  Time Charge Nurse/RN Notified 2044  Document  Patient Outcome Stabilized after interventions  Progress note created (see row info) Yes  Assess: SIRS CRITERIA  SIRS Temperature  0  SIRS Pulse 1  SIRS Respirations  0  SIRS WBC 1  SIRS Score Sum  2

## 2022-03-20 NOTE — Progress Notes (Signed)
PROGRESS NOTE    Patient: Elizabeth Herring                            PCP: Pcp, No                    DOB: 14-Mar-1956            DOA: 03/15/2022 UVO:[id number]             DOS: 03/20/2022, 11:38 AM   LOS: 5 days   Date of Service: The patient was seen and examined on 03/20/2022  Subjective:   The patient was seen and examined this morning, stable still complaining shortness of breath at rest, Still requiring 3 L of oxygen, maintaining O2 sat of 100% Patient stating she was not oxygen dependent prior to admission Denies any chest pain  Brief Narrative:   Elizabeth Herring is a 66 y.o. female, with history of chronic systolic heart failure with ischemic cardiomyopathy, last EF as per echo from October last year showed 20% with global hypokinesis and moderate RV dysfunction, hypertension, hyperlipidemia, CAD with severe multivessel disease and unsuccessful PCI with plan for medical treatment came to [facility] with worsening leg swelling for past few days.  Patient denies chest pain or shortness of breath at this time.  Denies nausea vomiting or diarrhea.  Denies abdominal pain.   ED events: Patient was found significantly volume overload with extensive bilateral lower extremity edema to the thigh area, BNP > 4500, troponin elevated 330, 452.  Cardiology was consulted, patient started on IV Lasix.  Also found to have elevated alk phos 219, AST 60, ALT 26, total bilirubin 5.0   Initially BiPAP was applied, BiPAP has been weaned off.  Currently on room air.   Assessment and plan:   Acute on chronic systolic and diastolic HF with exacerbation/  ICM LVEF < 20%  -Continues to have shortness of breath at rest, and worse with exertion -Not O2 dependent at baseline, hold -Currently requiring 3 L of oxygen, O2 sat 100%  -Repeating echo: <20%. The left  ventricle has severely decreased function.,  Reduced right LV systolic, chronic 0.6 x 0.4 cm mobile echodensity attached to the  pulmonic  valve leaflet with motion pattern independent of the pulmonic valve. There  is mild to moderate pulmonary valve regurgitation.   We will continue..   -Per Cardiology 11/8 >>initiating 80 mg IV Lasix, >>> was   Lasix drip 15 mg/hr  -Switching the patient to p.o. torsemide 40 mg twice daily today 03/20/2022  I/O last 3 completed shifts: In: 476.3 [P.O.:320; I.V.:156.3] Out: 1100 [Urine:1100] Total I/O In: 240 [P.O.:240] Out: 300 [Urine:300]   Intake/Output Summary (Last 24 hours) at 03/20/2022 1136 Last data filed at 03/20/2022 1109 Gross per 24 hour  Intake 440 ml  Output 800 ml  Net -360 ml      -Monitoring Reds Clip , BMP, daily weights, I's and O's -Hold beta-blockers -Hold ACEi/ARB/ARNI/MRA - Continue Lisabeth Register on hold as per cardiology recommendation.   Filed Weights   03/15/22 1147 03/15/22 2201 03/19/22 0519  Weight: 63.5 kg 77.6 kg 74.8 kg      -Patient not a candidate for ICD due to GDMT noncompliance, per EP  Volume overload-bilateral lower extremity edema -secondary to above.  She also has open areas in lower extremities.  -  wound care consult for wound care management of lower extremity wounds  -Eas Lasix drip >>>  switch to p.o. torsemide  Multivessel CAD  -Elevated troponin-likely ischemic demand -Continue aspirin 81 mg and Plavix 75 mg once daily, indefinitely -Continue atorvastatin 80 mg nightly   HLD -Continue atorvastatin 80 mg nightly -Outpatient management   Transaminitis-patient has transaminitis with increased bilirubin.  We will obtain abdominal ultrasound.  CKD stage IIIb -monitoring, improving Lab Results  Component Value Date   CREATININE 1.22 (H) 03/20/2022   CREATININE 1.21 (H) 03/19/2022   CREATININE 1.24 (H) 03/18/2022    -creatinine 1.58, slightly above baseline of 1.21 as of March 2023.[name]  Entresto on hold.    Hyponatremia  -Likely due to aggressive diuresing -Serum sodium 134, 135, 129  today -Monitoring closely  -Anticipating initiation of supplement sodium   hypoalbuminemia: -Repleting accordingly Repleting albumin IV    Skin Assessment: I have examined the patient's skin and I agree with the wound assessment as performed by wound care team As outlined belowe: Pressure Injury 03/15/22 Buttocks Medial Stage 2 -  Partial thickness loss of dermis presenting as a shallow open injury with a red, pink wound bed without slough. healing st 2 (Active)  03/15/22 2215  Location: Buttocks  Location Orientation: Medial  Staging: Stage 2 -  Partial thickness loss of dermis presenting as a shallow open injury with a red, pink wound bed without slough.  Wound Description (Comments): healing st 2  Present on Admission: Yes  Dressing Type Foam - Lift dressing to assess site every shift 03/20/22 0943     ------------------------------------------------------------------------------------------------------------------------------------------------  DVT prophylaxis:   SCDs  Code Status:   Code Status: Full Code  Family Communication: No family member present at bedside- attempt will be made to update daily The above findings and plan of care has been discussed with patient (and family)  in detail,  they expressed understanding and agreement of above. -Advance care planning has been discussed.   -Palliative care consulted to determine goals of care and CODE STATUS Progress remain poor due to call Reddys, end-stage heart failure   Admission status:   Status is: Inpatient Remains inpatient appropriate because: Needing aggressive IV medication for diuresis due to significant volume overload   Disposition: Presenting from home  Anticipating to be discharged to Elizabeth Herring  Monday 04/04/2022   Procedures:   No admission procedures for [facility] encounter.   Antimicrobials:  Anti-infectives (From admission, onward)    None        Medication:   aspirin EC  81 mg Oral  Daily   atorvastatin  80 mg Oral Daily   clopidogrel  75 mg Oral Daily   dapagliflozin propanediol  10 mg Oral Q breakfast   montelukast  10 mg Oral Daily   torsemide  40 mg Oral BID    acetaminophen **OR** acetaminophen, ondansetron **OR** ondansetron (ZOFRAN) IV   Objective:   Vitals:   03/19/22 1330 03/19/22 1539 03/19/22 2017 03/20/22 0311  BP: 100/69 107/69 113/71 (!) 116/98  Pulse: 98 95 96 94  Resp: (!) _0 Temp: 97.7 F (36.5 C) 97.6 F (36.4 C) 97.8 F (36.6 C) (!) 97.4 F (36.3 C)  TempSrc: Oral  Oral Oral  SpO2: 98% 100% 100% 100%  Weight:      Height:        Intake/Output Summary (Last 24 hours) at 03/20/2022 1138 Last data filed at 03/20/2022 1109 Gross per 24 hour  Intake 440 ml  Output 800 ml  Net -360 ml   Filed Weights   03/15/22 1147 03/15/22 2201 03/19/22  9735  Weight: 63.5 kg 77.6 kg 74.8 kg     Examination:     Physical Exam:   General:  AAO x 3,  cooperative, no distress;   HEENT:  Normocephalic, PERRL, otherwise with in Normal limits   Neuro:  CNII-XII intact. , normal motor and sensation, reflexes intact   Lungs:   Improved shortness of breath, clear to auscultation BL, Respirations unlabored,  No wheezes /bilateral lower lobe crackles but improved  Cardio:    S1/S2, RRR, No murmure, No Rubs or Gallops   Abdomen:  Soft, non-tender, bowel sounds active all four quadrants, no guarding or peritoneal signs.  Muscular  skeletal:  Limited exam -global generalized weaknesses - in bed, able to move all 4 extremities,   2+ pulses,  symmetric, ++2 pitting edema  Skin:  Dry, warm to touch, negative for any Rashes,  Wounds: Please see nursing documentation  Pressure Injury 03/15/22 Buttocks Medial Stage 2 -  Partial thickness loss of dermis presenting as a shallow open injury with a red, pink wound bed without slough. healing st 2 (Active)  03/15/22 2215  Location: Buttocks  Location Orientation: Medial  Staging: Stage 2 -   Partial thickness loss of dermis presenting as a shallow open injury with a red, pink wound bed without slough.  Wound Description (Comments): healing st 2  Present on Admission: Yes  Dressing Type Foam - Lift dressing to assess site every shift 03/20/22 0943                      ------------------------------------------------------------------------------------------------------------------------------------------    LABs:     Latest Ref Rng & Units 03/16/2022    4:28 AM 03/15/2022    1:07 PM 08/04/2021   12:56 PM  CBC  WBC 4.0 - 10.5 K/uL 8.5  7.8  4.3   Hemoglobin 12.0 - 15.0 g/dL 12.9  13.6  9.5   Hematocrit 36.0 - 46.0 % 38.5  41.3  28.2   Platelets 150 - 400 K/uL 173  185  174       Latest Ref Rng & Units 03/20/2022    4:29 AM 03/19/2022    5:25 AM 03/18/2022    3:24 AM  CMP  Glucose 70 - 99 mg/dL 135  104  142   BUN 8 - 23 mg/dL 29  29  34   Creatinine 0.44 - 1.00 mg/dL 1.22  1.21  1.24   Sodium 135 - 145 mmol/L 129  135  134   Potassium 3.5 - 5.1 mmol/L 3.5  3.6  2.8   Chloride 98 - 111 mmol/L 92  94  96   CO2 22 - 32 mmol/L _0 Calcium 8.9 - 10.3 mg/dL 7.2  7.9  8.2        Micro Results No results found for this or any previous visit (from the past 240 hour(s)).  Radiology Reports No results found.  SIGNED: Deatra James, MD, FHM. [facility],  Pager (please use [email or web address] to page/text) Please use Epic Secure Chat for non-urgent communication (7AM-7PM)  If 7PM-7AM, please contact night-coverage [email or web address], 03/20/2022, 11:38 AM

## 2022-03-21 ENCOUNTER — Encounter (HOSPITAL_COMMUNITY): Payer: Self-pay | Admitting: *Deleted

## 2022-03-21 ENCOUNTER — Inpatient Hospital Stay (HOSPITAL_COMMUNITY)
Admission: EM | Admit: 2022-03-21 | Discharge: 2022-04-08 | DRG: 291 | Disposition: E | Payer: Medicare HMO | Source: Skilled Nursing Facility | Attending: Pulmonary Disease | Admitting: Pulmonary Disease

## 2022-03-21 ENCOUNTER — Emergency Department (HOSPITAL_COMMUNITY): Payer: Medicare HMO

## 2022-03-21 ENCOUNTER — Other Ambulatory Visit: Payer: Self-pay

## 2022-03-21 DIAGNOSIS — I509 Heart failure, unspecified: Secondary | ICD-10-CM

## 2022-03-21 DIAGNOSIS — E8809 Other disorders of plasma-protein metabolism, not elsewhere classified: Secondary | ICD-10-CM | POA: Diagnosis present

## 2022-03-21 DIAGNOSIS — Z7401 Bed confinement status: Secondary | ICD-10-CM

## 2022-03-21 DIAGNOSIS — Y838 Other surgical procedures as the cause of abnormal reaction of the patient, or of later complication, without mention of misadventure at the time of the procedure: Secondary | ICD-10-CM | POA: Diagnosis not present

## 2022-03-21 DIAGNOSIS — Z8249 Family history of ischemic heart disease and other diseases of the circulatory system: Secondary | ICD-10-CM

## 2022-03-21 DIAGNOSIS — R600 Localized edema: Secondary | ICD-10-CM

## 2022-03-21 DIAGNOSIS — Z7902 Long term (current) use of antithrombotics/antiplatelets: Secondary | ICD-10-CM

## 2022-03-21 DIAGNOSIS — E785 Hyperlipidemia, unspecified: Secondary | ICD-10-CM | POA: Diagnosis present

## 2022-03-21 DIAGNOSIS — I255 Ischemic cardiomyopathy: Secondary | ICD-10-CM | POA: Diagnosis present

## 2022-03-21 DIAGNOSIS — Z955 Presence of coronary angioplasty implant and graft: Secondary | ICD-10-CM

## 2022-03-21 DIAGNOSIS — I2510 Atherosclerotic heart disease of native coronary artery without angina pectoris: Secondary | ICD-10-CM | POA: Diagnosis present

## 2022-03-21 DIAGNOSIS — I252 Old myocardial infarction: Secondary | ICD-10-CM

## 2022-03-21 DIAGNOSIS — N179 Acute kidney failure, unspecified: Secondary | ICD-10-CM | POA: Diagnosis present

## 2022-03-21 DIAGNOSIS — I5082 Biventricular heart failure: Secondary | ICD-10-CM | POA: Diagnosis present

## 2022-03-21 DIAGNOSIS — N1832 Chronic kidney disease, stage 3b: Secondary | ICD-10-CM | POA: Diagnosis present

## 2022-03-21 DIAGNOSIS — D6959 Other secondary thrombocytopenia: Secondary | ICD-10-CM | POA: Diagnosis present

## 2022-03-21 DIAGNOSIS — J690 Pneumonitis due to inhalation of food and vomit: Secondary | ICD-10-CM | POA: Diagnosis present

## 2022-03-21 DIAGNOSIS — Z6835 Body mass index (BMI) 35.0-35.9, adult: Secondary | ICD-10-CM

## 2022-03-21 DIAGNOSIS — I5043 Acute on chronic combined systolic (congestive) and diastolic (congestive) heart failure: Secondary | ICD-10-CM | POA: Diagnosis not present

## 2022-03-21 DIAGNOSIS — T82838A Hemorrhage of vascular prosthetic devices, implants and grafts, initial encounter: Secondary | ICD-10-CM | POA: Diagnosis not present

## 2022-03-21 DIAGNOSIS — I130 Hypertensive heart and chronic kidney disease with heart failure and stage 1 through stage 4 chronic kidney disease, or unspecified chronic kidney disease: Principal | ICD-10-CM | POA: Diagnosis present

## 2022-03-21 DIAGNOSIS — Z7982 Long term (current) use of aspirin: Secondary | ICD-10-CM

## 2022-03-21 DIAGNOSIS — F17210 Nicotine dependence, cigarettes, uncomplicated: Secondary | ICD-10-CM | POA: Diagnosis present

## 2022-03-21 DIAGNOSIS — I462 Cardiac arrest due to underlying cardiac condition: Secondary | ICD-10-CM | POA: Diagnosis present

## 2022-03-21 DIAGNOSIS — E871 Hypo-osmolality and hyponatremia: Secondary | ICD-10-CM | POA: Diagnosis present

## 2022-03-21 DIAGNOSIS — J9601 Acute respiratory failure with hypoxia: Secondary | ICD-10-CM | POA: Diagnosis present

## 2022-03-21 DIAGNOSIS — G931 Anoxic brain damage, not elsewhere classified: Secondary | ICD-10-CM | POA: Diagnosis present

## 2022-03-21 DIAGNOSIS — Z20822 Contact with and (suspected) exposure to covid-19: Secondary | ICD-10-CM | POA: Diagnosis present

## 2022-03-21 DIAGNOSIS — I5084 End stage heart failure: Secondary | ICD-10-CM | POA: Diagnosis present

## 2022-03-21 DIAGNOSIS — Z79899 Other long term (current) drug therapy: Secondary | ICD-10-CM

## 2022-03-21 DIAGNOSIS — Z66 Do not resuscitate: Secondary | ICD-10-CM | POA: Diagnosis present

## 2022-03-21 DIAGNOSIS — Z515 Encounter for palliative care: Secondary | ICD-10-CM

## 2022-03-21 DIAGNOSIS — I469 Cardiac arrest, cause unspecified: Principal | ICD-10-CM

## 2022-03-21 DIAGNOSIS — D638 Anemia in other chronic diseases classified elsewhere: Secondary | ICD-10-CM | POA: Diagnosis present

## 2022-03-21 DIAGNOSIS — I5023 Acute on chronic systolic (congestive) heart failure: Secondary | ICD-10-CM | POA: Diagnosis present

## 2022-03-21 DIAGNOSIS — G9341 Metabolic encephalopathy: Secondary | ICD-10-CM | POA: Diagnosis present

## 2022-03-21 DIAGNOSIS — E43 Unspecified severe protein-calorie malnutrition: Secondary | ICD-10-CM | POA: Diagnosis present

## 2022-03-21 DIAGNOSIS — R570 Cardiogenic shock: Secondary | ICD-10-CM | POA: Diagnosis present

## 2022-03-21 LAB — BASIC METABOLIC PANEL
Anion gap: 11 (ref 5–15)
Anion gap: 15 (ref 5–15)
BUN: 34 mg/dL — ABNORMAL HIGH (ref 8–23)
BUN: 38 mg/dL — ABNORMAL HIGH (ref 8–23)
CO2: 26 mmol/L (ref 22–32)
CO2: 21 mmol/L — ABNORMAL LOW (ref 22–32)
Calcium: 7.2 mg/dL — ABNORMAL LOW (ref 8.9–10.3)
Calcium: 6.9 mg/dL — ABNORMAL LOW (ref 8.9–10.3)
Chloride: 92 mmol/L — ABNORMAL LOW (ref 98–111)
Chloride: 91 mmol/L — ABNORMAL LOW (ref 98–111)
Creatinine, Ser: 1.64 mg/dL — ABNORMAL HIGH (ref 0.44–1.00)
Creatinine, Ser: 2.45 mg/dL — ABNORMAL HIGH (ref 0.44–1.00)
GFR, Estimated: 35 mL/min — ABNORMAL LOW (ref >60)
GFR, Estimated: 21 mL/min — ABNORMAL LOW (ref >60)
Glucose, Bld: 105 mg/dL — ABNORMAL HIGH (ref 70–99)
Glucose, Bld: 86 mg/dL (ref 70–99)
Potassium: 4.3 mmol/L (ref 3.5–5.1)
Potassium: 5.2 mmol/L — ABNORMAL HIGH (ref 3.5–5.1)
Sodium: 129 mmol/L — ABNORMAL LOW (ref 135–145)
Sodium: 127 mmol/L — ABNORMAL LOW (ref 135–145)

## 2022-03-21 LAB — CBC
HCT: 33.3 % — ABNORMAL LOW (ref 36.0–46.0)
Hemoglobin: 11.5 g/dL — ABNORMAL LOW (ref 12.0–15.0)
MCH: 33.4 pg (ref 26.0–34.0)
MCHC: 34.5 g/dL (ref 30.0–36.0)
MCV: 96.8 fL (ref 80.0–100.0)
Platelets: 73 10*3/uL — ABNORMAL LOW (ref 150–400)
RBC: 3.44 MIL/uL — ABNORMAL LOW (ref 3.87–5.11)
RDW: 20.3 % — ABNORMAL HIGH (ref 11.5–15.5)
WBC: 10.3 10*3/uL (ref 4.0–10.5)
nRBC: 8.4 % — ABNORMAL HIGH (ref 0.0–0.2)

## 2022-03-21 LAB — BRAIN NATRIURETIC PEPTIDE: B Natriuretic Peptide: 942 pg/mL — ABNORMAL HIGH (ref 0.0–100.0)

## 2022-03-21 LAB — CBG MONITORING, ED: Glucose-Capillary: 83 mg/dL (ref 70–99)

## 2022-03-21 LAB — MAGNESIUM
Magnesium: 2.2 mg/dL (ref 1.7–2.4)
Magnesium: 2.3 mg/dL (ref 1.7–2.4)

## 2022-03-21 MED ORDER — EPINEPHRINE HCL 5 MG/250ML IV SOLN IN NS
0.5000 ug/min | INTRAVENOUS | Status: DC
  Administered 2022-03-21: 10 ug/min via INTRAVENOUS
  Administered 2022-03-22: 20 ug/min via INTRAVENOUS
  Administered 2022-03-22 – 2022-03-23 (×7): 30 ug/min via INTRAVENOUS
  Filled 2022-03-21 (×12): qty 250

## 2022-03-21 MED ORDER — DOBUTAMINE IN D5W 4-5 MG/ML-% IV SOLN
2.5000 ug/kg/min | INTRAVENOUS | Status: DC
  Administered 2022-03-21: 2 ug/kg/min via INTRAVENOUS
  Administered 2022-03-22: 5 ug/kg/min via INTRAVENOUS
  Filled 2022-03-21: qty 250

## 2022-03-21 MED ORDER — NOREPINEPHRINE 4 MG/250ML-% IV SOLN
0.0000 ug/min | INTRAVENOUS | Status: DC
  Administered 2022-03-21: 5 ug/min via INTRAVENOUS
  Administered 2022-03-22: 15 ug/min via INTRAVENOUS
  Filled 2022-03-21: qty 250

## 2022-03-21 MED ORDER — TORSEMIDE 20 MG PO TABS: 40.0000 mg | ORAL_TABLET | Freq: Two times a day (BID) | ORAL | Status: DC

## 2022-03-21 MED ORDER — EPINEPHRINE 1 MG/10ML IJ SOSY
PREFILLED_SYRINGE | INTRAMUSCULAR | Status: AC | PRN
  Administered 2022-03-21: 1 mg via INTRAVENOUS

## 2022-03-21 MED ORDER — TORSEMIDE 40 MG PO TABS: 40.0000 mg | ORAL_TABLET | Freq: Every day | ORAL | 1 refills | Status: AC

## 2022-03-21 NOTE — Plan of Care (Signed)
Pt alert and oriented x 4. Total care. No prns given. Pt had 24 beats of vtach while changing dsg. Vitals obtained and stable. MD notified. Mag added to am labs. Dsg changed at appro 0300. Pt had bm this am. Sats maintained with 2.5 L /[place]  Problem: Education: Goal: Knowledge of General Education information will improve Description: Including pain rating scale, medication(s)/side effects and non-pharmacologic comfort measures Outcome: Progressing   Problem: Health Behavior/Discharge Planning: Goal: Ability to manage health-related needs will improve Outcome: Progressing   Problem: Clinical Measurements: Goal: Ability to maintain clinical measurements within normal limits will improve Outcome: Progressing Goal: Will remain free from infection Outcome: Progressing Goal: Diagnostic test results will improve Outcome: Progressing Goal: Respiratory complications will improve Outcome: Progressing Goal: Cardiovascular complication will be avoided Outcome: Progressing   Problem: Activity: Goal: Risk for activity intolerance will decrease Outcome: Progressing   Problem: Nutrition: Goal: Adequate nutrition will be maintained Outcome: Progressing   Problem: Coping: Goal: Level of anxiety will decrease Outcome: Progressing   Problem: Elimination: Goal: Will not experience complications related to bowel motility Outcome: Progressing Goal: Will not experience complications related to urinary retention Outcome: Progressing   Problem: Pain Managment: Goal: General experience of comfort will improve Outcome: Progressing   Problem: Safety: Goal: Ability to remain free from injury will improve Outcome: Progressing   Problem: Skin Integrity: Goal: Risk for impaired skin integrity will decrease Outcome: Progressing

## 2022-03-21 NOTE — Discharge Summary (Signed)
Physician Discharge Summary   Patient: Elizabeth Herring MRN: [id number] DOB: 09/22/1955  Admit date:     03/15/2022  Discharge date: 03/31/2022  Discharge Physician: Deatra James   PCP: Pcp, No   Recommendations at discharge:  F/up with cardiology in 1 week  BMP in 1 week F/up with PCP in 1-2 weeks  F/up with out patient palliative care as soon as possible  Discharge Diagnoses: Principal Problem:   Acute exacerbation of CHF (congestive heart failure) ([place]) Active Problems:   Pressure injury of skin   Nonrheumatic pulmonary valve insufficiency   Coronary artery disease involving native coronary artery of native heart without angina pectoris   Acute on chronic combined systolic and diastolic CHF (congestive heart failure) [facility])    Hospital Course: Elizabeth Herring is a 66 y.o. 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  Patient denies chest pain or shortness of breath at this time.  Denies nausea vomiting or diarrhea.  Denies abdominal pain.   ED events: Patient was found significantly volume overload with extensive bilateral lower extremity edema to the thigh area, BNP > 4500, troponin elevated 330, 452.  Cardiology was consulted, patient started on IV Lasix.  Also found to have elevated alk phos 219, AST 60, ALT 26, total bilirubin 5.0   Initially BiPAP was applied, BiPAP has been weaned off.  Currently on room air.   Assessment and plan:   Acute on chronic combined systolic and diastolic HF with exacerbation/  ICM LVEF < 20%  -Continues to have shortness of breath at rest, and worse with exertion -Not O2 dependent at baseline,  hold -Currently requiring 3 L of oxygen, O2 sat 100%  -Repeating echo: <20%. The left  ventricle has severely decreased function.,  Reduced right LV systolic, chronic 0.6 x 0.4 cm mobile echodensity attached to the pulmonic  valve leaflet with motion pattern independent of the pulmonic valve. There  is mild to moderate pulmonary valve regurgitation.   We will continue..   -Per Cardiology 11/8 >>initiating 80 mg IV Lasix, >>> was   Lasix drip 15 mg/hr  -Switching the patient to p.o. torsemide 40 mg twice daily today 03/20/2022  I/O last 3 completed shifts: In: 476.3 [P.O.:320; I.V.:156.3] Out: 1100 [Urine:1100] Total I/O In: 240 [P.O.:240] Out: 300 [Urine:300]   Intake/Output Summary (Last 24 hours) at 03/20/2022 1138 Last data filed at 03/20/2022 1109 Gross per 24 hour  Intake 440 ml  Output 800 ml  Net -360 ml      -Monitoring Reds Clip , BMP, daily weights, I's and O's -Hold beta-blockers -Hold ACEi/ARB/ARNI/MRA - Continue Lisabeth Register on hold as per cardiology recommendation.   Filed Weights   03/15/22 1147 03/15/22 2201 03/19/22 0519  Weight: 63.5 kg 77.6 kg 74.8 kg      -Patient not a candidate for ICD due to GDMT noncompliance, per EP  Volume overload-bilateral lower extremity edema -secondary to above.  She also has open areas in lower extremities.  -  wound care consult for wound care management of lower extremity wounds  -Eas Lasix drip >>> switch to p.o. torsemide  Multivessel CAD  -Elevated troponin-likely ischemic demand -Continue aspirin 81 mg and Plavix 75 mg once daily, indefinitely -  Continue atorvastatin 80 mg nightly   HLD -Continue atorvastatin 80 mg nightly -Outpatient management   Transaminitis-patient has transaminitis with increased bilirubin.  We will obtain abdominal ultrasound.  CKD stage IIIb -monitoring, improving Lab Results  Component Value Date   CREATININE 1.22 (H) 03/20/2022   CREATININE 1.21 (H) 03/19/2022    CREATININE 1.24 (H) 03/18/2022    -creatinine 1.58, slightly above baseline of 1.21 as of March 2023.[name]  Entresto on hold.    Hyponatremia  -Likely due to aggressive diuresing -Serum sodium 134, 135, 129 today -Monitoring closely  -Anticipating initiation of supplement sodium   hypoalbuminemia: -Repleting accordingly Repleting albumin IV   Assessment and Plan: No notes have been filed under this hospital service. Service: Hospitalist    Consultants: Cardiology  Procedures performed: IV lasix gtt   Disposition: Skilled nursing facility Diet recommendation:  Discharge Diet Orders (From admission, onward)     Start     Ordered   03/30/2022 0000  Diet - low sodium heart healthy        03/16/2022 0741           Cardiac diet DISCHARGE MEDICATION: Allergies as of 04/02/2022       Reactions   Chlorhexidine         Medication List     STOP taking these medications    Entresto 24-26 MG Generic drug: sacubitril-valsartan   furosemide 20 MG tablet Commonly known as: LASIX   pantoprazole 40 MG tablet Commonly known as: Protonix       TAKE these medications    aspirin EC 81 MG tablet Take 1 tablet (81 mg total) by mouth daily. Swallow whole. Resume on 08/12/2021   atorvastatin 80 MG tablet Commonly known as: LIPITOR TAKE ONE TABLET BY MOUTH ONCE EVERY DAY.   clopidogrel 75 MG tablet Commonly known as: PLAVIX Take 1 tablet (75 mg total) by mouth once daily. Resume on 08/12/2021   dapagliflozin propanediol 10 MG Tabs tablet Commonly known as: Farxiga Take 1 tablet (10 mg total) by mouth daily with breakfast. Please keep appointment for further refills   montelukast 10 MG tablet Commonly known as: Singulair Take 1 tablet by mouth once daily.   Multi-Vitamin tablet Take 1 tablet by mouth daily.   Olopatadine HCl 0.2 % Soln Commonly known as: Pataday Instill 1 drop in both eyes once daily.   potassium chloride 10 MEQ tablet Commonly known as:  KLOR-CON Take 1 tablet (10 mEq total) by mouth once daily.   Torsemide 40 MG Tabs Take 40 mg by mouth daily.               Discharge Care Instructions  (From admission, onward)           Start     Ordered   03/20/2022 0000  Discharge wound care:       Comments: Per wound care RN instructions... Please consult wound care team ASAP.   03/27/2022 0741            Contact information for after-discharge care     [facility] .   Service: Skilled Nursing Contact information: [address] [place] [phone]                    Discharge Exam: Danley Danker Weights   03/15/22 1147 03/15/22 2201 03/19/22 0519  Weight: 63.5 kg 77.6 kg 74.8 kg      Physical Exam:   General:  AAO x 3,  cooperative, no distress;   HEENT:  Normocephalic, PERRL, otherwise with in Normal limits   Neuro:  CNII-XII intact. , normal motor and sensation, reflexes intact   Lungs:   Clear to auscultation BL, Respirations unlabored,  No wheezes / crackles  Cardio:    S1/S2, RRR, No murmure, No Rubs or Gallops   Abdomen:  Soft, non-tender, bowel sounds active all four quadrants, no guarding or peritoneal signs.  Muscular  skeletal:  Limited exam -sever global generalized weaknesses - in bed, able to move all 4 extremities,   2+ pulses,  symmetric, ++2  pitting edema  Skin:  Dry, warm to touch, negative for any Rashes,  Wounds: Please see nursing documentation  Pressure Injury 03/15/22 Buttocks Medial Stage 2 -  Partial thickness loss of dermis presenting as a shallow open injury with a red, pink wound bed without slough. healing st 2 (Active)  03/15/22 2215  Location: Buttocks  Location Orientation: Medial  Staging: Stage 2 -  Partial thickness loss of dermis presenting as a shallow open injury with a red, pink wound bed without slough.  Wound Description (Comments): healing st 2  Present on Admission: Yes   Dressing Type Foam - Lift dressing to assess site every shift;Other (Comment) (xeroform) 03/31/2022 0305          Condition at discharge: stable  The results of significant diagnostics from this hospitalization (including imaging, microbiology, ancillary and laboratory) are listed below for reference.   Imaging Studies: ECHOCARDIOGRAM COMPLETE  Result Date: 03/16/2022    ECHOCARDIOGRAM REPORT   Patient Name:   ALLAINA BROTZMAN Date of Exam: 03/16/2022 Medical Rec #:  [id number]             Height:       60.0 in Accession #:    [id number]            Weight:       171.1 lb Date of Birth:  1955/06/26             BSA:          1.747 m Patient Age:    59 years              BP:           98/75 mmHg Patient Gender: F                     HR:           99 bpm. Exam Location:  [facility] Procedure: 2D Echo, Cardiac Doppler, Color Doppler and Intracardiac            Opacification Agent Indications:    Cardiomyopathy-Ischemic  History:        Patient has prior history of Echocardiogram examinations, most                 recent 02/11/2021. Cardiomyopathy and CHF, Previous Myocardial                 Infarction; Risk Factors:Hypertension, Current Smoker and                 Dyslipidemia.  Sonographer:    Wenda Low Referring Phys: [place]  1. Left ventricular ejection fraction, by estimation, is <20%. The left ventricle has severely decreased function. The left ventricle has no regional wall motion abnormalities. The left ventricular internal cavity size was mildly dilated. Left ventricular diastolic parameters are consistent with Grade I  diastolic dysfunction (impaired relaxation). There is the interventricular septum is flattened in diastole ('D' shaped left ventricle), consistent with right ventricular volume overload. No evidence of LV thrombus.  2. Right ventricular systolic function is moderately reduced. The right ventricular size is mildly enlarged. Moderately increased right  ventricular wall thickness. Unable to calculate PASP due to severe TR.  3. The tricuspid valve is abnormal. Tricuspid valve regurgitation is severe.  4. There is a 0.6 x 0.4 cm mobile echodensity attached to the pulmonic valve leaflet with motion pattern independent of the pulmonic valve. There is mild to moderate pulmonary valve regurgitation. Differential include fibroelastoma, calcified valve, vegetation, thrombus. Clinical correlation is recommended.  5. The inferior vena cava is dilated in size with <50% respiratory variability, suggesting right atrial pressure of 15 mmHg.  6. Large pleural effusion in both left and right lateral regions. Comparison(s): The mobile echodensity on pulmonic valve was present on prior Echo from 02/2021. FINDINGS  Left Ventricle: Left ventricular ejection fraction, by estimation, is <20%. The left ventricle has severely decreased function. The left ventricle has no regional wall motion abnormalities. Definity contrast agent was given IV to delineate the left ventricular endocardial borders. The left ventricular internal cavity size was mildly dilated. There is no left ventricular hypertrophy. The interventricular septum is flattened in diastole ('D' shaped left ventricle), consistent with right ventricular volume overload. Left ventricular diastolic parameters are consistent with Grade I diastolic dysfunction (impaired relaxation).  LV Wall Scoring: There is diffuse hypokinesis. Right Ventricle: The right ventricular size is mildly enlarged. Moderately increased right ventricular wall thickness. Right ventricular systolic function is moderately reduced. The tricuspid regurgitant velocity is 1.96 m/s, and with an assumed right atrial pressure of 15 mmHg, the estimated right ventricular systolic pressure is 40.8 mmHg. Left Atrium: Left atrial size was mildly dilated. Right Atrium: Right atrial size was severely dilated. Pericardium: There is no evidence of pericardial effusion. Mitral  Valve: The mitral valve is abnormal. Severely decreased mobility of the mitral valve leaflets. Trivial mitral valve regurgitation. No evidence of mitral valve stenosis. MV peak gradient, 8.9 mmHg. The mean mitral valve gradient is 4.0 mmHg. Tricuspid Valve: The tricuspid valve is abnormal. Tricuspid valve regurgitation is severe. No evidence of tricuspid stenosis. Aortic Valve: The aortic valve is tricuspid. Aortic valve regurgitation is not visualized. No aortic stenosis is present. Aortic valve mean gradient measures 3.0 mmHg. Aortic valve peak gradient measures 5.3 mmHg. Aortic valve area, by VTI measures 1.67 cm. Pulmonic Valve: There is a 0.6 x 0.4 cm mobile echodensity attached to the pulmonic valve leaflet with motion pattern independent of the pulmonic valve. Differential include fibroelastoma, calcified valve, vegetation, thrombus. Clinical correlation is recommended. Pulmonic valve regurgitation is mild to moderate. No evidence of pulmonic stenosis. Aorta: The aortic root is normal in size and structure. Venous: The inferior vena cava is dilated in size with less than 50% respiratory variability, suggesting right atrial pressure of 15 mmHg. IAS/Shunts: There is left bowing of the interatrial septum, suggestive of elevated right atrial pressure. No atrial level shunt detected by color flow Doppler. Additional Comments: There is a large pleural effusion in both left and right lateral regions. Mild ascites is present.  LEFT VENTRICLE PLAX 2D LVIDd:         6.00 cm LVIDs:         5.80 cm LV PW:         1.00 cm LV IVS:        1.00 cm LVOT diam:  1.90 cm LV SV:         32 LV SV Index:   18 LVOT Area:     2.84 cm  LV Volumes (MOD) LV vol d, MOD A4C: 112.0 ml LV SV MOD A4C:     112.0 ml RIGHT VENTRICLE RV Basal diam:  3.65 cm RV Mid diam:    3.40 cm RV S prime:     8.15 cm/s TAPSE (M-mode): 1.5 cm LEFT ATRIUM             Index        RIGHT ATRIUM           Index LA diam:        4.60 cm 2.63 cm/m   RA Area:      31.90 cm LA Vol (A2C):   77.0 ml 44.08 ml/m  RA Volume:   124.00 ml 70.99 ml/m LA Vol (A4C):   63.8 ml 36.53 ml/m LA Biplane Vol: 69.5 ml 39.79 ml/m  AORTIC VALVE                    PULMONIC VALVE AV Area (Vmax):    2.31 cm     PV Vmax:       1.05 m/s AV Area (Vmean):   1.92 cm     PV Peak grad:  4.4 mmHg AV Area (VTI):     1.67 cm AV Vmax:           115.00 cm/s AV Vmean:          85.400 cm/s AV VTI:            0.192 m AV Peak Grad:      5.3 mmHg AV Mean Grad:      3.0 mmHg LVOT Vmax:         93.80 cm/s LVOT Vmean:        57.800 cm/s LVOT VTI:          0.113 m LVOT/AV VTI ratio: 0.59  AORTA Ao Root diam: 2.60 cm MITRAL VALVE                TRICUSPID VALVE MV Area (PHT): 5.62 cm     TR Peak grad:   15.4 mmHg MV Area VTI:   1.15 cm     TR Vmax:        196.00 cm/s MV Peak grad:  8.9 mmHg MV Mean grad:  4.0 mmHg     SHUNTS MV Vmax:       1.49 m/s     Systemic VTI:  0.11 m MV Vmean:      88.9 cm/s    Systemic Diam: 1.90 cm MV Decel Time: 135 msec MV E velocity: 84.40 cm/s MV A velocity: 126.00 cm/s MV E/A ratio:  0.67 Vishnu Priya Mallipeddi Electronically signed by Lorelee Cover Mallipeddi Signature Date/Time: 03/16/2022/2:35:24 PM    Final    US Abdomen Limited  Result Date: 03/16/2022 CLINICAL DATA:  Elevated bilirubin EXAM: ULTRASOUND ABDOMEN LIMITED RIGHT UPPER QUADRANT COMPARISON:  None Available. FINDINGS: Gallbladder: No visible gallstones. Small amount of sludge in the gallbladder. There is regional ascites. Patient unable to communicate for determination of Murphy sign. Common bile duct: Diameter: No evidence of intrahepatic ductal dilatation. Common duct not well seen because of bowel gas and ascites. Liver: Overall normal echogenicity. Mild nodularity of the surface suggesting cirrhosis. No focal lesion. Portal vein is patent on color Doppler imaging with normal direction of blood flow towards the liver.  Other: None. IMPRESSION: 1. Mild nodularity of the liver surface suggesting cirrhosis. No  focal lesion. 2. Small amount of sludge in the gallbladder. No visible gallstones. 3. Perihepatic ascites. 4. Common duct not well seen because of bowel gas and ascites. No evidence of intrahepatic ductal dilatation. Electronically Signed   By: Nelson Chimes M.D.   On: 03/16/2022 09:05   DG Chest Port 1 View  Result Date: 03/15/2022 CLINICAL DATA:  Shortness of breath, BILATERAL lower extremity swelling for 2-3 days, history CHF EXAM: PORTABLE CHEST 1 VIEW COMPARISON:  Portable exam 1223 hours compared to 07/15/2020 FINDINGS: Enlargement of cardiac silhouette with pulmonary vascular congestion. Atherosclerotic calcification aorta. Bibasilar effusions and atelectasis. No definite infiltrate or edema. No pneumothorax. Skin folds project over LEFT chest. Bones demineralized. IMPRESSION: Enlargement of cardiac silhouette with slight pulmonary vascular congestion. Bibasilar pleural effusions and atelectasis. Aortic Atherosclerosis (ICD10-I70.0). Electronically Signed   By: Lavonia Dana M.D.   On: 03/15/2022 12:33    Microbiology: Results for orders placed or performed during the hospital encounter of 07/14/20  Resp Panel by RT-PCR (Flu A&B, Covid) Nasopharyngeal Swab     Status: None   Collection Time: 07/14/20  4:55 PM   Specimen: Nasopharyngeal Swab; Nasopharyngeal(NP) swabs in vial transport medium  Result Value Ref Range Status   SARS Coronavirus 2 by RT PCR NEGATIVE NEGATIVE Final    Comment: (NOTE) SARS-CoV-2 target nucleic acids are NOT DETECTED.  The SARS-CoV-2 RNA is generally detectable in upper respiratory specimens during the acute phase of infection. The lowest concentration of SARS-CoV-2 viral copies this assay can detect is 138 copies/mL. A negative result does not preclude SARS-Cov-2 infection and should not be used as the sole basis for treatment or other patient management decisions. A negative result may occur with  improper specimen collection/handling, submission of specimen  other than nasopharyngeal swab, presence of viral mutation(s) within the areas targeted by this assay, and inadequate number of viral copies(<138 copies/mL). A negative result must be combined with clinical observations, patient history, and epidemiological information. The expected result is Negative.  Fact Sheet for Patients:  EntrepreneurPulse.com.au  Fact Sheet for Healthcare Providers:  IncredibleEmployment.be  This test is no t yet approved or cleared by the [place] FDA and  has been authorized for detection and/or diagnosis of SARS-CoV-2 by FDA under an Emergency Use Authorization (EUA). This EUA will remain  in effect (meaning this test can be used) for the duration of the COVID-19 declaration under Section 564(b)(1) of the Act, 21 U.S.C.section 360bbb-3(b)(1), unless the authorization is terminated  or revoked sooner.       Influenza A by PCR NEGATIVE NEGATIVE Final   Influenza B by PCR NEGATIVE NEGATIVE Final    Comment: (NOTE) The Xpert Xpress SARS-CoV-2/FLU/RSV plus assay is intended as an aid in the diagnosis of influenza from Nasopharyngeal swab specimens and should not be used as a sole basis for treatment. Nasal washings and aspirates are unacceptable for Xpert Xpress SARS-CoV-2/FLU/RSV testing.  Fact Sheet for Patients: EntrepreneurPulse.com.au  Fact Sheet for Healthcare Providers: IncredibleEmployment.be  This test is not yet approved or cleared by the [place] FDA and has been authorized for detection and/or diagnosis of SARS-CoV-2 by FDA under an Emergency Use Authorization (EUA). This EUA will remain in effect (meaning this test can be used) for the duration of the COVID-19 declaration under Section 564(b)(1) of the Act, 21 U.S.C. section 360bbb-3(b)(1), unless the authorization is terminated or revoked.  Performed at [facility], [address]  [place], [place] [address]   Blood Culture (routine x 2)     Status: None   Collection Time: 07/14/20  4:55 PM   Specimen: BLOOD  Result Value Ref Range Status   Specimen Description BLOOD RIGHT ANTECUBITAL  Final   Special Requests   Final    BOTTLES DRAWN AEROBIC AND ANAEROBIC Blood Culture adequate volume   Culture   Final    NO GROWTH 5 DAYS Performed at [facility], [address] [place], [place] [address]    Report Status 07/19/2020 FINAL  Final  Blood Culture (routine x 2)     Status: None   Collection Time: 07/14/20  4:55 PM   Specimen: BLOOD  Result Value Ref Range Status   Specimen Description BLOOD LEFT ANTECUBITAL  Final   Special Requests   Final    BOTTLES DRAWN AEROBIC AND ANAEROBIC Blood Culture adequate volume   Culture   Final    NO GROWTH 5 DAYS Performed at [facility], [address] [place], [place] [address]    Report Status 07/19/2020 FINAL  Final  MRSA PCR Screening     Status: None   Collection Time: 07/15/20  8:20 PM   Specimen: Nasopharyngeal  Result Value Ref Range Status   MRSA by PCR NEGATIVE NEGATIVE Final    Comment:        The GeneXpert MRSA Assay (FDA approved for NASAL specimens only), is one component of a comprehensive MRSA colonization surveillance program. It is not intended to diagnose MRSA infection nor to guide or monitor treatment for MRSA infections. Performed at [facility], [address] [place], [place] [address]     Labs: CBC: Recent Labs  Lab 03/15/22 1307 03/16/22 0428  WBC 7.8 8.5  HGB 13.6 12.9  HCT 41.3 38.5  MCV 100.2* 98.5  PLT 185 193   Basic Metabolic Panel: Recent Labs  Lab 03/17/22 0356 03/17/22 0724 03/18/22 0324 03/19/22 0525 03/20/22 0429 03/27/2022 0350 03/09/2022 0351  NA 135  --  134* 135 129*  --  129*  K 4.3  --  2.8* 3.6 3.5  --  4.3  CL 97*  --  96* 94* 92*  --  92*  CO2 28  --  _0 --  26  GLUCOSE 105*  --  142* 104* 135*  --  105*  BUN 42*   --  34* 29* 29*  --  34*  CREATININE 1.44*  --  1.24* 1.21* 1.22*  --  1.64*  CALCIUM 9.1  --  8.2* 7.9* 7.2*  --  7.2*  MG  --  2.3 2.0  --   --  2.2  --    Liver Function Tests: Recent Labs  Lab 03/15/22 1307 03/16/22 0428  AST 60* 43*  ALT 26 20  ALKPHOS 219* 188*  BILITOT 5.0* 4.9*  PROT 8.6* 7.3  ALBUMIN 3.6 2.9*   CBG: No results for input(s): "GLUCAP" in the last 168 hours.  Discharge time spent: greater than 30 minutes.  Signed: Deatra James, MD [facility] 03/09/2022

## 2022-03-21 NOTE — ED Triage Notes (Addendum)
Pt arrived with [facility] for cardiac arrest from [facility]. Recently admitted and discharged for CHF. Staff found pt pulseless and apneic and started cpr, last seen an hour prior. EMS started CPR at 2140,initial rhythm asystole,  rosc at 2149, epi x1 given and epi gtt started. EMS intubated with ET and NG placed to R nare. IO to L tibia, IV to L wrist. On arrival, no palpable pulses. Cardiac US showing cardiac movement at 2229

## 2022-03-21 NOTE — Progress Notes (Signed)
Rounding Note    Patient Name: Elizabeth Herring Date of Encounter: 03/11/2022  [facility] Cardiologist: Ida Rogue, MD   Subjective   Some ongoing SOB  Inpatient Medications    Scheduled Meds:  aspirin EC  81 mg Oral Daily   atorvastatin  80 mg Oral Daily   clopidogrel  75 mg Oral Daily   dapagliflozin propanediol  10 mg Oral Q breakfast   montelukast  10 mg Oral Daily   [START ON 03/22/2022] torsemide  40 mg Oral BID   Continuous Infusions:  PRN Meds: acetaminophen **OR** acetaminophen, ondansetron **OR** ondansetron (ZOFRAN) IV   Vital Signs    Vitals:   03/20/22 1200 03/20/22 2027 03/20/22 2053 03/20/2022 0324  BP: 112/72 (!) 94/50 97/73 113/74  Pulse: 98 94 92 87  Resp: 19  20 20   Temp: 98.2 F (36.8 C) (!) 100.5 F (38.1 C) 97.6 F (36.4 C)   TempSrc: Oral Oral Oral   SpO2: 93% 95% 93% 100%  Weight:      Height:        Intake/Output Summary (Last 24 hours) at 03/17/2022 1043 Last data filed at 03/20/2022 1300 Gross per 24 hour  Intake 240 ml  Output 300 ml  Net -60 ml      03/19/2022    5:19 AM 03/15/2022   10:01 PM 03/15/2022   11:47 AM  Last 3 Weights  Weight (lbs) 165 lb 171 lb 1.2 oz 140 lb  Weight (kg) 74.844 kg 77.6 kg 63.504 kg      Telemetry    SR, 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   Neck: No JVD Cardiac: RRR, no murmurs, rubs, or gallops.  Respiratory: decreased breath sounds bilateral bases. GI: Soft, nontender, non-distended  [id number] leg wrappings.  Neuro:  Nonfocal  Psych: Normal affect   Labs    High Sensitivity Troponin:   Recent Labs  Lab 03/15/22 1307 03/15/22 1513  TROPONINIHS 330* 252*     Chemistry Recent Labs  Lab 03/15/22 1307 03/16/22 0428 03/17/22 0356 03/17/22 0724 03/18/22 0324 03/19/22 0525 03/20/22 0429 03/29/2022 0350 03/20/2022 0351  NA 133* 133*    < >  --  134* 135 129*  --  129*  K 5.2* 4.5   < >  --  2.8* 3.6 3.5  --  4.3  CL 96* 97*   < >  --  96* 94* 92*  --  92*  CO2 24 26   < >  --  27 31 29   --  26  GLUCOSE 87 111*   < >  --  142* 104* 135*  --  105*  BUN 44* 43*   < >  --  34* 29* 29*  --  34*  CREATININE 1.58* 1.38*   < >  --  1.24* 1.21* 1.22*  --  1.64*  CALCIUM 9.6 9.1   < >  --  8.2* 7.9* 7.2*  --  7.2*  MG  --   --   --  2.3 2.0  --   --  2.2  --   PROT 8.6* 7.3  --   --   --   --   --   --   --   ALBUMIN 3.6 2.9*  --   --   --   --   --   --   --  AST 60* 43*  --   --   --   --   --   --   --   ALT 26 20  --   --   --   --   --   --   --   ALKPHOS 219* 188*  --   --   --   --   --   --   --   BILITOT 5.0* 4.9*  --   --   --   --   --   --   --   GFRNONAA 36* 42*   < >  --  48* 50* 49*  --  35*  ANIONGAP 13 10   < >  --  11 10 8   --  11   < > = values in this interval not displayed.    Lipids No results for input(s): "CHOL", "TRIG", "HDL", "LABVLDL", "LDLCALC", "CHOLHDL" in the last 168 hours.  Hematology Recent Labs  Lab 03/15/22 1307 03/16/22 0428  WBC 7.8 8.5  RBC 4.12 3.91  HGB 13.6 12.9  HCT 41.3 38.5  MCV 100.2* 98.5  MCH 33.0 33.0  MCHC 32.9 33.5  RDW 20.2* 19.9*  PLT 185 173   Thyroid No results for input(s): "TSH", "FREET4" in the last 168 hours.  BNP Recent Labs  Lab 03/18/22 0324 03/19/22 0529 03/27/2022 0351  BNP 1,799.0* 896.0* 942.0*    DDimer No results for input(s): "DDIMER" in the last 168 hours.   Radiology    No results found.  Cardiac Studies    Patient Profile     Patient is a 66 year old F known to have multivessel CAD (RCA CTO, LAD 75 to 95%, LCx ISR CTO with moderate collaterals L to R and L to L) with LVEF less than 20% and no device (due to GDMT noncompliance, per EP), ICM LVEF less than 20% with no device, HLD was admitted to the hospitalist team for the management of ADHF.   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 CXR bilateral effusions, pulm congestion - from notes management has been complicated by medication noncompliance.  - not ICD candidate per EP due to noncompliance.   - incomplete I/Os, weights appear inaccurate.  -had been on lasix drip at 15, stopped yesterday and transitioned to oral torsemide 40mg  bid. Initial downtrend in Cr with diuresis, though acute rise today with some developing hyponatremia.  - soft bp's at this time limit medical therapy, at times 90s/50s, for this reason avoid beta blocker/ACE/ARB/ARNI/MRA. Continue farxiga 10mg  daily.   - difficult to determine adequacy of diuresis given incomplete I/Os, inaccurate weights. Exam would suggest still some degree of volume overload though difficult due to leg wounds with wraps. F/u reds vest data.   2.CAD - as documented above, no active acute issues.        For questions or updates, please contact [facility] Please consult [email or web address] for contact info under        Signed, 03/2022, MD  March 27, 2022, 10:43 AM

## 2022-03-21 NOTE — ED Notes (Signed)
MD placing central line

## 2022-03-21 NOTE — Discharge Summary (Signed)
Physician Discharge Summary   Patient: Elizabeth Herring MRN: [id number] DOB: 08/20/1955  Admit date:     03/15/2022  Discharge date: 03/20/2022  Discharge Physician: Deatra James   PCP: Pcp, No   Disposition to  SNF  From home   Recommendations at discharge:   Follow-up with the palliative care soon as possible End-stage heart failure-continue currently recommended diuretics per cardiologist  Follow-up with cardiologist in 1-2 weeks  Aggressive PT OT, fall precaution, Continue bilateral leg wound care-please consult wound care team upon arrival Newly required oxygen 3 L, maintain O2 sat > 92%  Discharge Diagnoses: Principal Problem:   Acute exacerbation of CHF (congestive heart failure) ([place]) Active Problems:   Pressure injury of skin   Nonrheumatic pulmonary valve insufficiency   Coronary artery disease involving native coronary artery of native heart without angina pectoris   Acute on chronic combined systolic and diastolic CHF (congestive heart failure) [facility])    Hospital Course: Elizabeth Herring is a 66 y.o. 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  Patient denies chest pain or shortness of breath at this time.  Denies nausea vomiting or diarrhea.  Denies abdominal pain.   ED events: Patient was found significantly volume overload with extensive bilateral lower extremity edema to the thigh area, BNP > 4500, troponin elevated 330, 452.  Cardiology was consulted, patient started on IV Lasix.  Also found to have elevated alk phos 219, AST 60, ALT 26, total bilirubin 5.0   Initially BiPAP was applied, BiPAP has been weaned off.   Currently on room air.    Acute on chronic combined systolic and diastolic HF with exacerbation/  ICM LVEF < 20%  -Continues to have shortness of breath at rest, and worse with exertion -Not O2 dependent at baseline, hold -Currently requiring 3 L of oxygen, O2 sat 100%  -Repeating echo: <20%. The left  ventricle has severely decreased function.,  Reduced right LV systolic, chronic 0.6 x 0.4 cm mobile echodensity attached to the pulmonic  valve leaflet with motion pattern independent of the pulmonic valve. There  is mild to moderate pulmonary valve regurgitation.   We will continue..   -Per Cardiology 11/8 >>initiating 80 mg IV Lasix, >>> was   Lasix drip 15 mg/hr  -Switching the patient to p.o. torsemide 40 mg daily 03/19/2022   >> BNP 4500 >> 942 >>> Greater than 5 years of fluid outs     I/O last 3 completed shifts: In: 480 [P.O.:480] Out: 400 [Urine:400] No intake/output data recorded.    Intake/Output Summary (Last 24 hours) at 03/09/2022 1108 Last data filed at 03/20/2022 1300 Gross per 24 hour  Intake 240 ml  Output 300 ml  Net -60 ml        -Monitoring Reds Clip , BMP, daily weights, I's and O's -Hold beta-blockers -Hold ACEi/ARB/ARNI/MRA - Continue Lisabeth Register on hold as per cardiology recommendation.  Filed Weights   03/15/22 2201 03/19/22 0519 03/16/2022 1000  Weight: 77.6 kg 74.8 kg 77.6 kg     -Patient not a candidate for ICD due to GDMT noncompliance, per EP  -  soft bp's at this time limit medical therapy, at times 90s/50s, for this reason avoid beta blocker/ACE/ARB/ARNI/MRA.  Continue farxiga 26m daily.     Volume overload-bilateral lower extremity edema -secondary to above.  She also has open areas in lower extremities.  -  wound care consult for wound care management of lower extremity wounds  -Eas Lasix drip >>> switch to p.o. torsemide  Multivessel CAD  -Elevated troponin-likely ischemic demand -Continue aspirin 81 mg and  Plavix 75 mg once daily, indefinitely -Continue atorvastatin 80 mg nightly   HLD -Continue atorvastatin 80 mg nightly -Outpatient management   Transaminitis-patient has transaminitis with increased bilirubin.  We will obtain abdominal ultrasound.  CKD stage IIIb -  Lab Results  Component Value Date   CREATININE 1.64 (H) 04/03/2022   CREATININE 1.22 (H) 03/20/2022   CREATININE 1.21 (H) 03/19/2022    - Entresto was on hold.    Hyponatremia  -Likely due to aggressive diuresing -Serum sodium 134, 135, 129 - stable   hypoalbuminemia:  -Was repleted with albumin IV      Consultants: Cardiology Procedures performed: 2D echocardiogram Disposition: Skilled nursing facility Diet recommendation:  Discharge Diet Orders (From admission, onward)     Start     Ordered   03/26/2022 0000  Diet - low sodium heart healthy        04/04/2022 0741           Cardiac diet DISCHARGE MEDICATION: Allergies as of 03/20/2022       Reactions   Chlorhexidine         Medication List     STOP taking these medications    Entresto 24-26 MG Generic drug: sacubitril-valsartan   furosemide 20 MG tablet Commonly known as: LASIX   pantoprazole 40 MG tablet Commonly known as: Protonix       TAKE these medications    aspirin EC 81 MG tablet Take 1 tablet (81 mg total) by mouth daily. Swallow whole. Resume on 08/12/2021   atorvastatin 80 MG tablet Commonly known as: LIPITOR TAKE ONE TABLET BY MOUTH ONCE EVERY DAY.   clopidogrel 75 MG tablet Commonly known as: PLAVIX Take 1 tablet (75 mg total) by mouth once daily. Resume on 08/12/2021   dapagliflozin propanediol 10 MG Tabs tablet Commonly known as: Farxiga Take 1 tablet (10 mg total) by mouth daily with breakfast. Please keep appointment for further refills   montelukast 10 MG tablet Commonly known as: Singulair Take 1 tablet by mouth once daily.   Multi-Vitamin tablet Take 1 tablet by mouth daily.   Olopatadine HCl 0.2  % Soln Commonly known as: Pataday Instill 1 drop in both eyes once daily.   potassium chloride 10 MEQ tablet Commonly known as: KLOR-CON Take 1 tablet (10 mEq total) by mouth once daily.   Torsemide 40 MG Tabs Take 40 mg by mouth daily.               Discharge Care Instructions  (From admission, onward)           Start     Ordered   03/12/2022 0000  Discharge wound care:       Comments: Per wound care RN instructions... Please consult wound care team ASAP.   03/28/2022 0741            Contact information for after-discharge care     [facility] .   Service: Skilled NChiropodistinformation: [phone]  Discharge Exam: Filed Weights   03/15/22 2201 03/19/22 0519 03/20/2022 1000  Weight: 77.6 kg 74.8 kg 77.6 kg      Physical Exam:   General:  AAO x 3,  cooperative, no distress;   HEENT:  Normocephalic, PERRL, otherwise with in Normal limits   Neuro:  CNII-XII intact. , normal motor and sensation, reflexes intact   Lungs:   Clear to auscultation BL, Respirations unlabored,  No wheezes / crackles  Cardio:    S1/S2, RRR, No murmure, No Rubs or Gallops   Abdomen:  Soft, non-tender, bowel sounds active all four quadrants, no guarding or peritoneal signs.  Muscular  skeletal:  Limited exam - sever global generalized weaknesses - in bed, able to move all 4 extremities,   2+ pulses,  symmetric, ++2 pitting edema  Skin:  Dry, warm to touch, bilateral lower extremity chronic wound, sacral decubitus  Wounds: Please see nursing documentation  Pressure Injury 03/15/22 Buttocks Medial Stage 2 -  Partial thickness loss of dermis presenting as a shallow open injury with a red, pink wound bed without slough. healing st 2 (Active)  03/15/22 2215  Location: Buttocks  Location Orientation: Medial  Staging: Stage 2 -  Partial thickness loss  of dermis presenting as a shallow open injury with a red, pink wound bed without slough.  Wound Description (Comments): healing st 2  Present on Admission: Yes  Dressing Type Foam - Lift dressing to assess site every shift;Other (Comment) (xeroform) 03/13/2022 0305          Condition at discharge: stable  The results of significant diagnostics from this hospitalization (including imaging, microbiology, ancillary and laboratory) are listed below for reference.   Imaging Studies: ECHOCARDIOGRAM COMPLETE  Result Date: 03/16/2022    ECHOCARDIOGRAM REPORT   Patient Name:   Elizabeth Herring Date of Exam: 03/16/2022 Medical Rec #:  [id number]             Height:       60.0 in Accession #:    [id number]            Weight:       171.1 lb Date of Birth:  06-02-1955             BSA:          1.747 m Patient Age:    63 years              BP:           98/75 mmHg Patient Gender: F                     HR:           99 bpm. Exam Location:  [facility] Procedure: 2D Echo, Cardiac Doppler, Color Doppler and Intracardiac            Opacification Agent Indications:    Cardiomyopathy-Ischemic  History:        Patient has prior history of Echocardiogram examinations, most                 recent 02/11/2021. Cardiomyopathy and CHF, Previous Myocardial                 Infarction; Risk Factors:Hypertension, Current Smoker and                 Dyslipidemia.  Sonographer:    Wenda Low Referring Phys: [place]  1. Left ventricular ejection fraction, by estimation, is <20%.  The left ventricle has severely decreased function. The left ventricle has no regional wall motion abnormalities. The left ventricular internal cavity size was mildly dilated. Left ventricular diastolic parameters are consistent with Grade I diastolic dysfunction (impaired relaxation). There is the interventricular septum is flattened in diastole ('D' shaped left ventricle), consistent with right ventricular volume overload. No  evidence of LV thrombus.  2. Right ventricular systolic function is moderately reduced. The right ventricular size is mildly enlarged. Moderately increased right ventricular wall thickness. Unable to calculate PASP due to severe TR.  3. The tricuspid valve is abnormal. Tricuspid valve regurgitation is severe.  4. There is a 0.6 x 0.4 cm mobile echodensity attached to the pulmonic valve leaflet with motion pattern independent of the pulmonic valve. There is mild to moderate pulmonary valve regurgitation. Differential include fibroelastoma, calcified valve, vegetation, thrombus. Clinical correlation is recommended.  5. The inferior vena cava is dilated in size with <50% respiratory variability, suggesting right atrial pressure of 15 mmHg.  6. Large pleural effusion in both left and right lateral regions. Comparison(s): The mobile echodensity on pulmonic valve was present on prior Echo from 02/2021. FINDINGS  Left Ventricle: Left ventricular ejection fraction, by estimation, is <20%. The left ventricle has severely decreased function. The left ventricle has no regional wall motion abnormalities. Definity contrast agent was given IV to delineate the left ventricular endocardial borders. The left ventricular internal cavity size was mildly dilated. There is no left ventricular hypertrophy. The interventricular septum is flattened in diastole ('D' shaped left ventricle), consistent with right ventricular volume overload. Left ventricular diastolic parameters are consistent with Grade I diastolic dysfunction (impaired relaxation).  LV Wall Scoring: There is diffuse hypokinesis. Right Ventricle: The right ventricular size is mildly enlarged. Moderately increased right ventricular wall thickness. Right ventricular systolic function is moderately reduced. The tricuspid regurgitant velocity is 1.96 m/s, and with an assumed right atrial pressure of 15 mmHg, the estimated right ventricular systolic pressure is 06.3 mmHg. Left  Atrium: Left atrial size was mildly dilated. Right Atrium: Right atrial size was severely dilated. Pericardium: There is no evidence of pericardial effusion. Mitral Valve: The mitral valve is abnormal. Severely decreased mobility of the mitral valve leaflets. Trivial mitral valve regurgitation. No evidence of mitral valve stenosis. MV peak gradient, 8.9 mmHg. The mean mitral valve gradient is 4.0 mmHg. Tricuspid Valve: The tricuspid valve is abnormal. Tricuspid valve regurgitation is severe. No evidence of tricuspid stenosis. Aortic Valve: The aortic valve is tricuspid. Aortic valve regurgitation is not visualized. No aortic stenosis is present. Aortic valve mean gradient measures 3.0 mmHg. Aortic valve peak gradient measures 5.3 mmHg. Aortic valve area, by VTI measures 1.67 cm. Pulmonic Valve: There is a 0.6 x 0.4 cm mobile echodensity attached to the pulmonic valve leaflet with motion pattern independent of the pulmonic valve. Differential include fibroelastoma, calcified valve, vegetation, thrombus. Clinical correlation is recommended. Pulmonic valve regurgitation is mild to moderate. No evidence of pulmonic stenosis. Aorta: The aortic root is normal in size and structure. Venous: The inferior vena cava is dilated in size with less than 50% respiratory variability, suggesting right atrial pressure of 15 mmHg. IAS/Shunts: There is left bowing of the interatrial septum, suggestive of elevated right atrial pressure. No atrial level shunt detected by color flow Doppler. Additional Comments: There is a large pleural effusion in both left and right lateral regions. Mild ascites is present.  LEFT VENTRICLE PLAX 2D LVIDd:         6.00 cm LVIDs:  5.80 cm LV PW:         1.00 cm LV IVS:        1.00 cm LVOT diam:     1.90 cm LV SV:         32 LV SV Index:   18 LVOT Area:     2.84 cm  LV Volumes (MOD) LV vol d, MOD A4C: 112.0 ml LV SV MOD A4C:     112.0 ml RIGHT VENTRICLE RV Basal diam:  3.65 cm RV Mid diam:    3.40  cm RV S prime:     8.15 cm/s TAPSE (M-mode): 1.5 cm LEFT ATRIUM             Index        RIGHT ATRIUM           Index LA diam:        4.60 cm 2.63 cm/m   RA Area:     31.90 cm LA Vol (A2C):   77.0 ml 44.08 ml/m  RA Volume:   124.00 ml 70.99 ml/m LA Vol (A4C):   63.8 ml 36.53 ml/m LA Biplane Vol: 69.5 ml 39.79 ml/m  AORTIC VALVE                    PULMONIC VALVE AV Area (Vmax):    2.31 cm     PV Vmax:       1.05 m/s AV Area (Vmean):   1.92 cm     PV Peak grad:  4.4 mmHg AV Area (VTI):     1.67 cm AV Vmax:           115.00 cm/s AV Vmean:          85.400 cm/s AV VTI:            0.192 m AV Peak Grad:      5.3 mmHg AV Mean Grad:      3.0 mmHg LVOT Vmax:         93.80 cm/s LVOT Vmean:        57.800 cm/s LVOT VTI:          0.113 m LVOT/AV VTI ratio: 0.59  AORTA Ao Root diam: 2.60 cm MITRAL VALVE                TRICUSPID VALVE MV Area (PHT): 5.62 cm     TR Peak grad:   15.4 mmHg MV Area VTI:   1.15 cm     TR Vmax:        196.00 cm/s MV Peak grad:  8.9 mmHg MV Mean grad:  4.0 mmHg     SHUNTS MV Vmax:       1.49 m/s     Systemic VTI:  0.11 m MV Vmean:      88.9 cm/s    Systemic Diam: 1.90 cm MV Decel Time: 135 msec MV E velocity: 84.40 cm/s MV A velocity: 126.00 cm/s MV E/A ratio:  0.67 Vishnu Priya Mallipeddi Electronically signed by Lorelee Cover Mallipeddi Signature Date/Time: 03/16/2022/2:35:24 PM    Final    US Abdomen Limited  Result Date: 03/16/2022 CLINICAL DATA:  Elevated bilirubin EXAM: ULTRASOUND ABDOMEN LIMITED RIGHT UPPER QUADRANT COMPARISON:  None Available. FINDINGS: Gallbladder: No visible gallstones. Small amount of sludge in the gallbladder. There is regional ascites. Patient unable to communicate for determination of Murphy sign. Common bile duct: Diameter: No evidence of intrahepatic ductal dilatation. Common duct not well seen because of bowel gas and ascites.  Liver: Overall normal echogenicity. Mild nodularity of the surface suggesting cirrhosis. No focal lesion. Portal vein is patent on  color Doppler imaging with normal direction of blood flow towards the liver. Other: None. IMPRESSION: 1. Mild nodularity of the liver surface suggesting cirrhosis. No focal lesion. 2. Small amount of sludge in the gallbladder. No visible gallstones. 3. Perihepatic ascites. 4. Common duct not well seen because of bowel gas and ascites. No evidence of intrahepatic ductal dilatation. Electronically Signed   By: Nelson Chimes M.D.   On: 03/16/2022 09:05   DG Chest Port 1 View  Result Date: 03/15/2022 CLINICAL DATA:  Shortness of breath, BILATERAL lower extremity swelling for 2-3 days, history CHF EXAM: PORTABLE CHEST 1 VIEW COMPARISON:  Portable exam 1223 hours compared to 07/15/2020 FINDINGS: Enlargement of cardiac silhouette with pulmonary vascular congestion. Atherosclerotic calcification aorta. Bibasilar effusions and atelectasis. No definite infiltrate or edema. No pneumothorax. Skin folds project over LEFT chest. Bones demineralized. IMPRESSION: Enlargement of cardiac silhouette with slight pulmonary vascular congestion. Bibasilar pleural effusions and atelectasis. Aortic Atherosclerosis (ICD10-I70.0). Electronically Signed   By: Lavonia Dana M.D.   On: 03/15/2022 12:33    Microbiology: Results for orders placed or performed during the hospital encounter of 07/14/20  Resp Panel by RT-PCR (Flu A&B, Covid) Nasopharyngeal Swab     Status: None   Collection Time: 07/14/20  4:55 PM   Specimen: Nasopharyngeal Swab; Nasopharyngeal(NP) swabs in vial transport medium  Result Value Ref Range Status   SARS Coronavirus 2 by RT PCR NEGATIVE NEGATIVE Final    Comment: (NOTE) SARS-CoV-2 target nucleic acids are NOT DETECTED.  The SARS-CoV-2 RNA is generally detectable in upper respiratory specimens during the acute phase of infection. The lowest concentration of SARS-CoV-2 viral copies this assay can detect is 138 copies/mL. A negative result does not preclude SARS-Cov-2 infection and should not be used as the  sole basis for treatment or other patient management decisions. A negative result may occur with  improper specimen collection/handling, submission of specimen other than nasopharyngeal swab, presence of viral mutation(s) within the areas targeted by this assay, and inadequate number of viral copies(<138 copies/mL). A negative result must be combined with clinical observations, patient history, and epidemiological information. The expected result is Negative.  Fact Sheet for Patients:  EntrepreneurPulse.com.au  Fact Sheet for Healthcare Providers:  IncredibleEmployment.be  This test is no t yet approved or cleared by the [place] FDA and  has been authorized for detection and/or diagnosis of SARS-CoV-2 by FDA under an Emergency Use Authorization (EUA). This EUA will remain  in effect (meaning this test can be used) for the duration of the COVID-19 declaration under Section 564(b)(1) of the Act, 21 U.S.C.section 360bbb-3(b)(1), unless the authorization is terminated  or revoked sooner.       Influenza A by PCR NEGATIVE NEGATIVE Final   Influenza B by PCR NEGATIVE NEGATIVE Final    Comment: (NOTE) The Xpert Xpress SARS-CoV-2/FLU/RSV plus assay is intended as an aid in the diagnosis of influenza from Nasopharyngeal swab specimens and should not be used as a sole basis for treatment. Nasal washings and aspirates are unacceptable for Xpert Xpress SARS-CoV-2/FLU/RSV testing.  Fact Sheet for Patients: EntrepreneurPulse.com.au  Fact Sheet for Healthcare Providers: IncredibleEmployment.be  This test is not yet approved or cleared by the [place] FDA and has been authorized for detection and/or diagnosis of SARS-CoV-2 by FDA under an Emergency Use Authorization (EUA). This EUA will remain in effect (meaning this test can be used) for  the duration of the COVID-19 declaration under Section 564(b)(1) of the  Act, 21 U.S.C. section 360bbb-3(b)(1), unless the authorization is terminated or revoked.  Performed at [facility], [address] [place], [place] [address]   Blood Culture (routine x 2)     Status: None   Collection Time: 07/14/20  4:55 PM   Specimen: BLOOD  Result Value Ref Range Status   Specimen Description BLOOD RIGHT ANTECUBITAL  Final   Special Requests   Final    BOTTLES DRAWN AEROBIC AND ANAEROBIC Blood Culture adequate volume   Culture   Final    NO GROWTH 5 DAYS Performed at [facility], [address] [place], [place] [address]    Report Status 07/19/2020 FINAL  Final  Blood Culture (routine x 2)     Status: None   Collection Time: 07/14/20  4:55 PM   Specimen: BLOOD  Result Value Ref Range Status   Specimen Description BLOOD LEFT ANTECUBITAL  Final   Special Requests   Final    BOTTLES DRAWN AEROBIC AND ANAEROBIC Blood Culture adequate volume   Culture   Final    NO GROWTH 5 DAYS Performed at [facility], [address] [place], [place] [address]    Report Status 07/19/2020 FINAL  Final  MRSA PCR Screening     Status: None   Collection Time: 07/15/20  8:20 PM   Specimen: Nasopharyngeal  Result Value Ref Range Status   MRSA by PCR NEGATIVE NEGATIVE Final    Comment:        The GeneXpert MRSA Assay (FDA approved for NASAL specimens only), is one component of a comprehensive MRSA colonization surveillance program. It is not intended to diagnose MRSA infection nor to guide or monitor treatment for MRSA infections. Performed at [facility], [address] [place], [place] [address]     Labs: CBC: Recent Labs  Lab 03/15/22 1307 03/16/22 0428  WBC 7.8 8.5  HGB 13.6 12.9  HCT 41.3 38.5  MCV 100.2* 98.5  PLT 185 665   Basic Metabolic Panel: Recent Labs  Lab 03/17/22 0356 03/17/22 0724 03/18/22 0324 03/19/22 0525 03/20/22 0429 03/28/2022 0350 04/05/2022 0351  NA 135  --  134* 135 129*  --  129*   K 4.3  --  2.8* 3.6 3.5  --  4.3  CL 97*  --  96* 94* 92*  --  92*  CO2 28  --  _0 --  26  GLUCOSE 105*  --  142* 104* 135*  --  105*  BUN 42*  --  34* 29* 29*  --  34*  CREATININE 1.44*  --  1.24* 1.21* 1.22*  --  1.64*  CALCIUM 9.1  --  8.2* 7.9* 7.2*  --  7.2*  MG  --  2.3 2.0  --   --  2.2  --    Liver Function Tests: Recent Labs  Lab 03/15/22 1307 03/16/22 0428  AST 60* 43*  ALT 26 20  ALKPHOS 219* 188*  BILITOT 5.0* 4.9*  PROT 8.6* 7.3  ALBUMIN 3.6 2.9*   CBG: No results for input(s): "GLUCAP" in the last 168 hours.  Discharge time spent: greater than 30 minutes.  Signed: Deatra James, MD [facility] 03/20/2022

## 2022-03-21 NOTE — ED Notes (Signed)
MD placing R radial art line

## 2022-03-21 NOTE — H&P (Signed)
NAME:  Elizabeth Herring, MRN:  [id number], DOB:  08-28-1955, LOS: 0 ADMISSION DATE:  03/16/2022, CONSULTATION DATE:  03/19/2022  REFERRING MD:  Vanita Panda, CHIEF COMPLAINT:  found down    History of Present Illness:  66 yo woman with hx of HFrEF, RV dysfunction, HTN, HLD, CAD, s/p unsuccessful PCI, recently admitted for leg swelling and CHF exacerbation, discharged today to SNF, returns after cardiac arrest today.    Had last been seen one our prior to being found down and pulseless at SNF.   ROSC after 1 round of CPR by EMS, Intubation in the field.   No pulses on arrival to ED.   Cardiac arrest for an additional 15 min until ROSC.  Art line placed in ED, Central line placed in ED.     Discharged today with plan to "follow up with palliative care asap" continue diuretics for end stage heart failure, cont B LE wound care, cont 3L [place] oxygen (new).  Last EF 20% 10/22, RV dysfunction.  Pulmonic valve vegetation? (0.6 x 0.4 mobile echodensity) D/c on farxiga.  Some hypotension during admission. elevated bili.   Pertinent  Medical History  HFrEF,  RV dysfunction,  HTN,  HLD,  CAD,  s/p unsuccessful PCI,  recently admitted for leg swelling and CHF exacerbation CKD Transaminitis Hyponatremia hypoalbuminemia  Meds: asa, lipitor, farxiga, plavix, signulair, olopatadine 1 gtt both eyes qd, torsemide.   Significant Hospital Events: Including procedures, antibiotic start and stop dates in addition to other pertinent events     Interim History / Subjective:    Objective   Blood pressure (!) 128/90, pulse 75, resp. rate 19, SpO2 100 %.    Vent Mode: PRVC FiO2 (%):  [100 %] 100 % Set Rate:  [20 bmp] 20 bmp Vt Set:  [500 mL] 500 mL PEEP:  [5 cmH20] 5 cmH20 Plateau Pressure:  [30 cmH20] 30 cmH20  No intake or output data in the 24 hours ending 03/25/2022 2309 There were no vitals filed for this visit.  Examination: General: intubated, non responsive, moving RUE intermittently   HENT: intubated, perrl Swelling infierior to R clavicle  Lungs: CTAB  Cardiovascular: bradycardic --> nsr  Abdomen nt, nd, nbs  Extremities: Extensive edema up to hips, cool extremites  Neuro: non responsive, non responsive to pain  GU: foley no uop  Resolved Hospital Problem list     Assessment & Plan:  Cardiac arrest Hypotension: appears c/w cardiogenic shock.  CAD vs PE vs progressive CHF.  Had echodensity on pulmonic valve on prior echo.  Concern for RV failure > LV failure given good PaO2 being >200, extrem LE edema.   Trial of diuresis (lasix 80 and infusion).  Check Echo Cont epi and levophed, dobutamine.    Throbocytopenia:  Bleeding from central line, will transfuse.  Hold off on heparin for possible CAD or PE given bleeding.    Hypothermia  Hypoxia: progressing during time in ED>   CAD, hx of of failed PCI  CKD Cr  2.47   Transaminitis  Hyponatremia  hypoalbuminemia  Best Practice (right click and "Reselect all SmartList Selections" daily)   Diet/type: NPO DVT prophylaxis: other none given Thrombocytopenia  GI prophylaxis: PPI Lines: N/A Foley:  N/A Code Status:  full code Last date of multidisciplinary goals of care discussion [  Labs   CBC: Recent Labs  Lab 03/15/22 1307 03/16/22 0428  WBC 7.8 8.5  HGB 13.6 12.9  HCT 41.3 38.5  MCV 100.2* 98.5  PLT 185 173  Basic Metabolic Panel: Recent Labs  Lab 03/17/22 0356 03/17/22 0724 03/18/22 0324 03/19/22 0525 03/20/22 0429 04-15-2022 0350 04/15/22 0351  NA 135  --  134* 135 129*  --  129*  K 4.3  --  2.8* 3.6 3.5  --  4.3  CL 97*  --  96* 94* 92*  --  92*  CO2 28  --  27 31 29   --  26  GLUCOSE 105*  --  142* 104* 135*  --  105*  BUN 42*  --  34* 29* 29*  --  34*  CREATININE 1.44*  --  1.24* 1.21* 1.22*  --  1.64*  CALCIUM 9.1  --  8.2* 7.9* 7.2*  --  7.2*  MG  --  2.3 2.0  --   --  2.2  --    GFR: Estimated Creatinine Clearance: 31.5 mL/min (A) (by C-G formula based on SCr of  1.64 mg/dL (H)). Recent Labs  Lab 03/15/22 1307 03/16/22 0428  WBC 7.8 8.5    Liver Function Tests: Recent Labs  Lab 03/15/22 1307 03/16/22 0428  AST 60* 43*  ALT 26 20  ALKPHOS 219* 188*  BILITOT 5.0* 4.9*  PROT 8.6* 7.3  ALBUMIN 3.6 2.9*   No results for input(s): "LIPASE", "AMYLASE" in the last 168 hours. No results for input(s): "AMMONIA" in the last 168 hours.  ABG    Component Value Date/Time   HCO3 27.1 03/15/2022 1513   O2SAT 70.2 03/15/2022 1513     Coagulation Profile: No results for input(s): "INR", "PROTIME" in the last 168 hours.  Cardiac Enzymes: No results for input(s): "CKTOTAL", "CKMB", "CKMBINDEX", "TROPONINI" in the last 168 hours.  HbA1C: Hgb A1c MFr Bld  Date/Time Value Ref Range Status  07/16/2020 01:56 AM 5.6 4.8 - 5.6 % Final    Comment:    (NOTE) Pre diabetes:          5.7%-6.4%  Diabetes:              >6.4%  Glycemic control for   <7.0% adults with diabetes     CBG: Recent Labs  Lab 04-15-22 2258  GLUCAP 83    Review of Systems:   Unable   Past Medical History:  She,  has a past medical history of Coronary artery disease, HFrEF (heart failure with reduced ejection fraction) (HCC), Hyperlipidemia, Hypertension, Ischemic cardiomyopathy, Noncompliance, NSVT (nonsustained ventricular tachycardia) (HCC), and ST elevation myocardial infarction (STEMI) of inferior wall (HCC).   Surgical History:   Past Surgical History:  Procedure Laterality Date   COLONOSCOPY WITH PROPOFOL N/A 08/03/2021   Procedure: COLONOSCOPY WITH PROPOFOL;  Surgeon: 08/05/2021, MD;  Location: [facility] ENDOSCOPY;  Service: Gastroenterology;  Laterality: N/A;   CORONARY/GRAFT ACUTE MI REVASCULARIZATION N/A 07/15/2020   Procedure: Coronary/Graft Acute MI Revascularization;  Surgeon: 09/14/2020, MD;  Location: [facility] INVASIVE CV LAB;  Service: Cardiovascular;  Laterality: N/A;   ESOPHAGOGASTRODUODENOSCOPY (EGD) WITH PROPOFOL N/A 08/03/2021   Procedure:  ESOPHAGOGASTRODUODENOSCOPY (EGD) WITH PROPOFOL;  Surgeon: 08/05/2021, MD;  Location: [facility] ENDOSCOPY;  Service: Gastroenterology;  Laterality: N/A;  Guaiac positive stools   GIVENS CAPSULE STUDY N/A 08/03/2021   Procedure: GIVENS CAPSULE STUDY;  Surgeon: 08/05/2021, MD;  Location: [facility] ENDOSCOPY;  Service: Gastroenterology;  Laterality: N/A;   LEFT HEART CATH AND CORONARY ANGIOGRAPHY N/A 07/15/2020   Procedure: LEFT HEART CATH AND CORONARY ANGIOGRAPHY;  Surgeon: 09/14/2020, MD;  Location: [facility] INVASIVE CV LAB;  Service: Cardiovascular;  Laterality: N/A;  Social History:   reports that she has been smoking cigarettes. She has been smoking an average of 1 pack per day. She has never used smokeless tobacco. She reports current alcohol use. She reports that she does not use drugs.   Family History:  Her family history includes Hypertension in her father.   Allergies Allergies  Allergen Reactions   Chlorhexidine      Home Medications  Prior to Admission medications   Medication Sig Start Date End Date Taking? Authorizing Provider  aspirin EC 81 MG tablet Take 1 tablet (81 mg total) by mouth daily. Swallow whole. Resume on 08/12/2021 08/04/21 08/04/22  Kathie Dike, MD  atorvastatin (LIPITOR) 80 MG tablet TAKE ONE TABLET BY MOUTH ONCE EVERY DAY. 03/03/21 03/03/22  Loel Dubonnet, NP  clopidogrel (PLAVIX) 75 MG tablet Take 1 tablet (75 mg total) by mouth once daily. Resume on 08/12/2021 08/04/21   Kathie Dike, MD  dapagliflozin propanediol (FARXIGA) 10 MG TABS tablet Take 1 tablet (10 mg total) by mouth daily with breakfast. Please keep appointment for further refills 01/17/22   Minna Merritts, MD  montelukast (SINGULAIR) 10 MG tablet Take 1 tablet by mouth once daily. 06/14/21     Multiple Vitamin (MULTI-VITAMIN) tablet Take 1 tablet by mouth daily.    [provider]  Olopatadine HCl (PATADAY) 0.2 % SOLN Instill 1 drop in both eyes once daily. 06/14/21     potassium chloride  (KLOR-CON) 10 MEQ tablet Take 1 tablet (10 mEq total) by mouth once daily. 01/14/22   Minna Merritts, MD  torsemide 40 MG TABS Take 40 mg by mouth daily. 03/29/2022   Deatra James, MD     Critical care time: 1

## 2022-03-21 NOTE — Progress Notes (Signed)
Ng Discharge Note  Admit Date:  03/15/2022 Discharge date: 02-Apr-2022   Charlann Boxer to be D/C'd Rehab per MD order.  AVS completed. Patient/caregiver able to verbalize understanding.  Discharge Medication: Allergies as of 2022/04/02       Reactions   Chlorhexidine         Medication List     STOP taking these medications    Entresto 24-26 MG Generic drug: sacubitril-valsartan   furosemide 20 MG tablet Commonly known as: LASIX   pantoprazole 40 MG tablet Commonly known as: Protonix       TAKE these medications    aspirin EC 81 MG tablet Take 1 tablet (81 mg total) by mouth daily. Swallow whole. Resume on 08/12/2021   atorvastatin 80 MG tablet Commonly known as: LIPITOR TAKE ONE TABLET BY MOUTH ONCE EVERY DAY.   clopidogrel 75 MG tablet Commonly known as: PLAVIX Take 1 tablet (75 mg total) by mouth once daily. Resume on 08/12/2021   dapagliflozin propanediol 10 MG Tabs tablet Commonly known as: Farxiga Take 1 tablet (10 mg total) by mouth daily with breakfast. Please keep appointment for further refills   montelukast 10 MG tablet Commonly known as: Singulair Take 1 tablet by mouth once daily.   Multi-Vitamin tablet Take 1 tablet by mouth daily.   Olopatadine HCl 0.2 % Soln Commonly known as: Pataday Instill 1 drop in both eyes once daily.   potassium chloride 10 MEQ tablet Commonly known as: KLOR-CON Take 1 tablet (10 mEq total) by mouth once daily.   Torsemide 40 MG Tabs Take 40 mg by mouth daily.               Discharge Care Instructions  (From admission, onward)           Start     Ordered   2022-04-02 0000  Discharge wound care:       Comments: Per wound care RN instructions... Please consult wound care team ASAP.   04/02/22 0741            Discharge Assessment: Vitals:   03/20/22 2053 2022/04/02 0324  BP: 97/73 113/74  Pulse: 92 87  Resp: 20 20  Temp: 97.6 F (36.4 C)   SpO2: 93% 100%   Skin clean, dry and  intact without evidence of skin break down, no evidence of skin tears noted. IV catheter discontinued intact. Site without signs and symptoms of complications - no redness or edema noted at insertion site, patient denies c/o pain - only slight tenderness at site.  Dressing with slight pressure applied.  D/c Instructions-Education: Discharge instructions given to patient/family with verbalized understanding. D/c education completed with patient/family including follow up instructions, medication list, d/c activities limitations if indicated, with other d/c instructions as indicated by MD - patient able to verbalize understanding, all questions fully answered. Patient instructed to return to ED, call 911, or call MD for any changes in condition.  Patient escorted via WC, and D/C home via private auto.  Harrington Challenger, LPN 62/70/3500 9:38 PM

## 2022-03-21 NOTE — TOC Transition Note (Signed)
Transition of Care [facility]) - CM/SW Discharge Note   Patient Details  Name: Elizabeth Herring MRN: [id number] Date of Birth: 06-May-1956  Transition of [facility] CM/SW Contact:  Karn Cassis, LCSW Phone Number: 2022/04/04, 8:01 AM   Clinical Narrative: Pt d/c today to [facility]. Pt and pt's sister, [name] aware and agreeable. Authorization received. D/C summary will be sent to SNF when completed. Will provide contact information for RN to call report. Pt will transport via [facility].       Final next level of care: Skilled Nursing Facility Barriers to Discharge: Barriers Resolved   Patient Goals and CMS Choice Patient states their goals for this hospitalization and ongoing recovery are:: rehab then home      Discharge Placement              Patient chooses bed at: Other - please specify in the comment section below: [facility]) Patient to be transferred to facility by: [facility] Name of family member notified: [name]- sister Patient and family notified of of transfer: 04/04/22  Discharge Plan and Services                                     Social Determinants of Health (SDOH) Interventions     Readmission Risk Interventions     No data to display

## 2022-03-22 ENCOUNTER — Inpatient Hospital Stay (HOSPITAL_COMMUNITY): Payer: Medicare HMO

## 2022-03-22 ENCOUNTER — Emergency Department (HOSPITAL_COMMUNITY): Payer: Medicare HMO

## 2022-03-22 DIAGNOSIS — R4182 Altered mental status, unspecified: Secondary | ICD-10-CM | POA: Diagnosis not present

## 2022-03-22 DIAGNOSIS — I462 Cardiac arrest due to underlying cardiac condition: Secondary | ICD-10-CM | POA: Diagnosis present

## 2022-03-22 DIAGNOSIS — Z20822 Contact with and (suspected) exposure to covid-19: Secondary | ICD-10-CM | POA: Diagnosis present

## 2022-03-22 DIAGNOSIS — D638 Anemia in other chronic diseases classified elsewhere: Secondary | ICD-10-CM | POA: Diagnosis present

## 2022-03-22 DIAGNOSIS — M7989 Other specified soft tissue disorders: Secondary | ICD-10-CM

## 2022-03-22 DIAGNOSIS — G9341 Metabolic encephalopathy: Secondary | ICD-10-CM | POA: Diagnosis present

## 2022-03-22 DIAGNOSIS — D6959 Other secondary thrombocytopenia: Secondary | ICD-10-CM | POA: Diagnosis present

## 2022-03-22 DIAGNOSIS — N179 Acute kidney failure, unspecified: Secondary | ICD-10-CM | POA: Diagnosis present

## 2022-03-22 DIAGNOSIS — T82838A Hemorrhage of vascular prosthetic devices, implants and grafts, initial encounter: Secondary | ICD-10-CM | POA: Diagnosis not present

## 2022-03-22 DIAGNOSIS — I5084 End stage heart failure: Secondary | ICD-10-CM | POA: Diagnosis present

## 2022-03-22 DIAGNOSIS — Z66 Do not resuscitate: Secondary | ICD-10-CM | POA: Diagnosis present

## 2022-03-22 DIAGNOSIS — R609 Edema, unspecified: Secondary | ICD-10-CM

## 2022-03-22 DIAGNOSIS — N1832 Chronic kidney disease, stage 3b: Secondary | ICD-10-CM | POA: Diagnosis present

## 2022-03-22 DIAGNOSIS — F17210 Nicotine dependence, cigarettes, uncomplicated: Secondary | ICD-10-CM | POA: Diagnosis present

## 2022-03-22 DIAGNOSIS — E785 Hyperlipidemia, unspecified: Secondary | ICD-10-CM | POA: Diagnosis present

## 2022-03-22 DIAGNOSIS — R570 Cardiogenic shock: Secondary | ICD-10-CM

## 2022-03-22 DIAGNOSIS — I509 Heart failure, unspecified: Secondary | ICD-10-CM | POA: Diagnosis not present

## 2022-03-22 DIAGNOSIS — I469 Cardiac arrest, cause unspecified: Principal | ICD-10-CM | POA: Diagnosis present

## 2022-03-22 DIAGNOSIS — Z6835 Body mass index (BMI) 35.0-35.9, adult: Secondary | ICD-10-CM | POA: Diagnosis not present

## 2022-03-22 DIAGNOSIS — I5082 Biventricular heart failure: Secondary | ICD-10-CM | POA: Diagnosis present

## 2022-03-22 DIAGNOSIS — J690 Pneumonitis due to inhalation of food and vomit: Secondary | ICD-10-CM | POA: Diagnosis present

## 2022-03-22 DIAGNOSIS — E871 Hypo-osmolality and hyponatremia: Secondary | ICD-10-CM | POA: Diagnosis present

## 2022-03-22 DIAGNOSIS — I5023 Acute on chronic systolic (congestive) heart failure: Secondary | ICD-10-CM | POA: Diagnosis present

## 2022-03-22 DIAGNOSIS — Z7189 Other specified counseling: Secondary | ICD-10-CM

## 2022-03-22 DIAGNOSIS — Y838 Other surgical procedures as the cause of abnormal reaction of the patient, or of later complication, without mention of misadventure at the time of the procedure: Secondary | ICD-10-CM | POA: Diagnosis not present

## 2022-03-22 DIAGNOSIS — G931 Anoxic brain damage, not elsewhere classified: Secondary | ICD-10-CM | POA: Diagnosis present

## 2022-03-22 DIAGNOSIS — Z515 Encounter for palliative care: Secondary | ICD-10-CM

## 2022-03-22 DIAGNOSIS — Z79899 Other long term (current) drug therapy: Secondary | ICD-10-CM | POA: Diagnosis not present

## 2022-03-22 DIAGNOSIS — I130 Hypertensive heart and chronic kidney disease with heart failure and stage 1 through stage 4 chronic kidney disease, or unspecified chronic kidney disease: Secondary | ICD-10-CM | POA: Diagnosis present

## 2022-03-22 DIAGNOSIS — J9601 Acute respiratory failure with hypoxia: Secondary | ICD-10-CM | POA: Diagnosis present

## 2022-03-22 DIAGNOSIS — E43 Unspecified severe protein-calorie malnutrition: Secondary | ICD-10-CM | POA: Diagnosis present

## 2022-03-22 DIAGNOSIS — I9589 Other hypotension: Secondary | ICD-10-CM | POA: Diagnosis not present

## 2022-03-22 LAB — POCT I-STAT 7, (LYTES, BLD GAS, ICA,H+H)
Acid-base deficit: 1 mmol/L (ref 0.0–2.0)
Bicarbonate: 25.9 mmol/L (ref 20.0–28.0)
Calcium, Ion: 0.91 mmol/L — ABNORMAL LOW (ref 1.15–1.40)
HCT: 32 % — ABNORMAL LOW (ref 36.0–46.0)
Hemoglobin: 10.9 g/dL — ABNORMAL LOW (ref 12.0–15.0)
O2 Saturation: 94 %
Patient temperature: 91.9
Potassium: 3.7 mmol/L (ref 3.5–5.1)
Sodium: 127 mmol/L — ABNORMAL LOW (ref 135–145)
TCO2: 27 mmol/L (ref 22–32)
pCO2 arterial: 44 mmHg (ref 32–48)
pH, Arterial: 7.36 (ref 7.35–7.45)
pO2, Arterial: 62 mmHg — ABNORMAL LOW (ref 83–108)

## 2022-03-22 LAB — COOXEMETRY PANEL
Carboxyhemoglobin: 1.5 % (ref 0.5–1.5)
Methemoglobin: 1.2 % (ref 0.0–1.5)
O2 Saturation: 77.6 %
Total hemoglobin: 10.3 g/dL — ABNORMAL LOW (ref 12.0–16.0)

## 2022-03-22 LAB — COMPREHENSIVE METABOLIC PANEL
ALT: 38 U/L (ref 0–44)
AST: 142 U/L — ABNORMAL HIGH (ref 15–41)
Albumin: 1.9 g/dL — ABNORMAL LOW (ref 3.5–5.0)
Alkaline Phosphatase: 222 U/L — ABNORMAL HIGH (ref 38–126)
Anion gap: 16 — ABNORMAL HIGH (ref 5–15)
BUN: 38 mg/dL — ABNORMAL HIGH (ref 8–23)
CO2: 23 mmol/L (ref 22–32)
Calcium: 6.9 mg/dL — ABNORMAL LOW (ref 8.9–10.3)
Chloride: 89 mmol/L — ABNORMAL LOW (ref 98–111)
Creatinine, Ser: 2.47 mg/dL — ABNORMAL HIGH (ref 0.44–1.00)
GFR, Estimated: 21 mL/min — ABNORMAL LOW (ref >60)
Glucose, Bld: 104 mg/dL — ABNORMAL HIGH (ref 70–99)
Potassium: 4.3 mmol/L (ref 3.5–5.1)
Sodium: 128 mmol/L — ABNORMAL LOW (ref 135–145)
Total Bilirubin: 4.8 mg/dL — ABNORMAL HIGH (ref 0.3–1.2)
Total Protein: 5.5 g/dL — ABNORMAL LOW (ref 6.5–8.1)

## 2022-03-22 LAB — GLUCOSE, CAPILLARY
Glucose-Capillary: 176 mg/dL — ABNORMAL HIGH (ref 70–99)
Glucose-Capillary: 155 mg/dL — ABNORMAL HIGH (ref 70–99)
Glucose-Capillary: 146 mg/dL — ABNORMAL HIGH (ref 70–99)
Glucose-Capillary: 128 mg/dL — ABNORMAL HIGH (ref 70–99)
Glucose-Capillary: 104 mg/dL — ABNORMAL HIGH (ref 70–99)

## 2022-03-22 LAB — I-STAT ARTERIAL BLOOD GAS, ED
Acid-Base Excess: 3 mmol/L — ABNORMAL HIGH (ref 0.0–2.0)
Bicarbonate: 28.1 mmol/L — ABNORMAL HIGH (ref 20.0–28.0)
Calcium, Ion: 0.88 mmol/L — CL (ref 1.15–1.40)
HCT: 35 % — ABNORMAL LOW (ref 36.0–46.0)
Hemoglobin: 11.9 g/dL — ABNORMAL LOW (ref 12.0–15.0)
O2 Saturation: 100 %
Patient temperature: 94.9
Potassium: 4.4 mmol/L (ref 3.5–5.1)
Sodium: 125 mmol/L — ABNORMAL LOW (ref 135–145)
TCO2: 29 mmol/L (ref 22–32)
pCO2 arterial: 40.2 mmHg (ref 32–48)
pH, Arterial: 7.443 (ref 7.35–7.45)
pO2, Arterial: 221 mmHg — ABNORMAL HIGH (ref 83–108)

## 2022-03-22 LAB — LACTIC ACID, PLASMA
Lactic Acid, Venous: 3.1 mmol/L (ref 0.5–1.9)
Lactic Acid, Venous: 2.2 mmol/L (ref 0.5–1.9)

## 2022-03-22 LAB — BRAIN NATRIURETIC PEPTIDE: B Natriuretic Peptide: 855.3 pg/mL — ABNORMAL HIGH (ref 0.0–100.0)

## 2022-03-22 LAB — PROTIME-INR
INR: 1.5 — ABNORMAL HIGH (ref 0.8–1.2)
Prothrombin Time: 18.3 seconds — ABNORMAL HIGH (ref 11.4–15.2)

## 2022-03-22 LAB — SARS CORONAVIRUS 2 BY RT PCR: SARS Coronavirus 2 by RT PCR: NEGATIVE

## 2022-03-22 LAB — PROCALCITONIN: Procalcitonin: 5.35 ng/mL

## 2022-03-22 LAB — TROPONIN I (HIGH SENSITIVITY)
Troponin I (High Sensitivity): 1270 ng/L (ref <18)
Troponin I (High Sensitivity): 981 ng/L (ref <18)

## 2022-03-22 LAB — MRSA NEXT GEN BY PCR, NASAL
MRSA by PCR Next Gen: NOT DETECTED
MRSA by PCR Next Gen: NOT DETECTED

## 2022-03-22 LAB — HEMOGLOBIN A1C
Hgb A1c MFr Bld: 6.4 % — ABNORMAL HIGH (ref 4.8–5.6)
Mean Plasma Glucose: 136.98 mg/dL

## 2022-03-22 LAB — CBG MONITORING, ED: Glucose-Capillary: 109 mg/dL — ABNORMAL HIGH (ref 70–99)

## 2022-03-22 MED ORDER — VASOPRESSIN 20 UNITS/100 ML INFUSION FOR SHOCK
INTRAVENOUS | Status: AC
  Administered 2022-03-22: 0.03 [IU]/min via INTRAVENOUS
  Filled 2022-03-22: qty 100

## 2022-03-22 MED ORDER — LORAZEPAM 2 MG/ML IJ SOLN
2.0000 mg | Freq: Once | INTRAMUSCULAR | Status: AC
  Administered 2022-03-22: 2 mg via INTRAVENOUS
  Filled 2022-03-22: qty 1

## 2022-03-22 MED ORDER — INSULIN ASPART 100 UNIT/ML IJ SOLN
0.0000 [IU] | INTRAMUSCULAR | Status: DC
  Administered 2022-03-22: 1 [IU] via SUBCUTANEOUS
  Administered 2022-03-22: 2 [IU] via SUBCUTANEOUS
  Administered 2022-03-22: 1 [IU] via SUBCUTANEOUS

## 2022-03-22 MED ORDER — MIDAZOLAM HCL 2 MG/2ML IJ SOLN
3.0000 mg | Freq: Once | INTRAMUSCULAR | Status: DC
  Filled 2022-03-22: qty 4

## 2022-03-22 MED ORDER — SODIUM CHLORIDE 0.9% IV SOLUTION: Freq: Once | INTRAVENOUS | Status: DC

## 2022-03-22 MED ORDER — SODIUM CHLORIDE 0.9 % IV SOLN
2.0000 g | INTRAVENOUS | Status: DC
  Administered 2022-03-22 – 2022-03-23 (×2): 2 g via INTRAVENOUS
  Filled 2022-03-22 (×2): qty 12.5

## 2022-03-22 MED ORDER — SODIUM CHLORIDE 0.9 % IV SOLN
2000.0000 mg | Freq: Once | INTRAVENOUS | Status: AC
  Administered 2022-03-22: 2000 mg via INTRAVENOUS
  Filled 2022-03-22: qty 20

## 2022-03-22 MED ORDER — FENTANYL 2500MCG IN NS 250ML (10MCG/ML) PREMIX INFUSION
0.0000 ug/h | INTRAVENOUS | Status: DC
  Administered 2022-03-22: 25 ug/h via INTRAVENOUS
  Filled 2022-03-22: qty 250

## 2022-03-22 MED ORDER — SODIUM BICARBONATE 8.4 % IV SOLN
INTRAVENOUS | Status: AC
  Filled 2022-03-22: qty 50

## 2022-03-22 MED ORDER — CALCIUM GLUCONATE-NACL 1-0.675 GM/50ML-% IV SOLN
1.0000 g | Freq: Once | INTRAVENOUS | Status: DC
  Filled 2022-03-22: qty 50

## 2022-03-22 MED ORDER — VASOPRESSIN 20 UNITS/100 ML INFUSION FOR SHOCK
0.0000 [IU]/min | INTRAVENOUS | Status: DC
  Administered 2022-03-23: 0.04 [IU]/min via INTRAVENOUS
  Filled 2022-03-22: qty 100

## 2022-03-22 MED ORDER — FUROSEMIDE 10 MG/ML IJ SOLN
8.0000 mg/h | INTRAVENOUS | Status: DC
  Administered 2022-03-22: 8 mg/h via INTRAVENOUS
  Filled 2022-03-22: qty 20

## 2022-03-22 MED ORDER — FENTANYL CITRATE PF 50 MCG/ML IJ SOSY
25.0000 ug | PREFILLED_SYRINGE | INTRAMUSCULAR | Status: DC | PRN
  Filled 2022-03-22: qty 2

## 2022-03-22 MED ORDER — POLYETHYLENE GLYCOL 3350 17 G PO PACK: 17.0000 g | PACK | Freq: Every day | ORAL | Status: DC | PRN

## 2022-03-22 MED ORDER — DOCUSATE SODIUM 50 MG/5ML PO LIQD: 100.0000 mg | Freq: Two times a day (BID) | ORAL | Status: DC | PRN

## 2022-03-22 MED ORDER — LEVETIRACETAM IN NACL 500 MG/100ML IV SOLN
500.0000 mg | Freq: Two times a day (BID) | INTRAVENOUS | Status: DC
  Administered 2022-03-22 – 2022-03-23 (×2): 500 mg via INTRAVENOUS
  Filled 2022-03-22 (×2): qty 100

## 2022-03-22 MED ORDER — ORAL CARE MOUTH RINSE
15.0000 mL | OROMUCOSAL | Status: DC
  Administered 2022-03-22 – 2022-03-23 (×15): 15 mL via OROMUCOSAL

## 2022-03-22 MED ORDER — FUROSEMIDE 10 MG/ML IJ SOLN
80.0000 mg | Freq: Once | INTRAMUSCULAR | Status: DC
  Filled 2022-03-22: qty 8

## 2022-03-22 MED ORDER — VANCOMYCIN HCL 750 MG/150ML IV SOLN: 750.0000 mg | INTRAVENOUS | Status: DC

## 2022-03-22 MED ORDER — NOREPINEPHRINE 16 MG/250ML-% IV SOLN
0.0000 ug/min | INTRAVENOUS | Status: DC
  Administered 2022-03-22: 36 ug/min via INTRAVENOUS
  Administered 2022-03-22: 12 ug/min via INTRAVENOUS
  Administered 2022-03-22: 14 ug/min via INTRAVENOUS
  Administered 2022-03-22: 34 ug/min via INTRAVENOUS
  Administered 2022-03-23 (×3): 80 ug/min via INTRAVENOUS
  Filled 2022-03-22: qty 250
  Filled 2022-03-22: qty 500
  Filled 2022-03-22 (×8): qty 250

## 2022-03-22 MED ORDER — HEPARIN SODIUM (PORCINE) 5000 UNIT/ML IJ SOLN: 5000.0000 [IU] | Freq: Three times a day (TID) | INTRAMUSCULAR | Status: DC

## 2022-03-22 MED ORDER — PANTOPRAZOLE SODIUM 40 MG IV SOLR
40.0000 mg | INTRAVENOUS | Status: DC
  Administered 2022-03-22 – 2022-03-23 (×2): 40 mg via INTRAVENOUS
  Filled 2022-03-22 (×2): qty 10

## 2022-03-22 MED ORDER — VANCOMYCIN HCL 1750 MG/350ML IV SOLN
1750.0000 mg | Freq: Once | INTRAVENOUS | Status: AC
  Administered 2022-03-22: 1750 mg via INTRAVENOUS
  Filled 2022-03-22: qty 350

## 2022-03-22 MED ORDER — SODIUM BICARBONATE 8.4 % IV SOLN
INTRAVENOUS | Status: AC | PRN
  Administered 2022-03-22 (×2): 50 meq via INTRAVENOUS

## 2022-03-22 MED ORDER — ORAL CARE MOUTH RINSE: 15.0000 mL | OROMUCOSAL | Status: DC | PRN

## 2022-03-22 NOTE — Progress Notes (Signed)
LTM EEG hooked up and running - no initial skin breakdown - push button tested - [facility] monitoring.  

## 2022-03-22 NOTE — Progress Notes (Signed)
Lower extremity venous duplex has been completed.   Preliminary results in CV Proc.   Elizabeth Herring Elizabeth Herring 03/22/2022 2:15 PM

## 2022-03-22 NOTE — Consult Note (Signed)
Consultation Note Date: 03/22/2022   Patient Name: Elizabeth Herring  DOB: 11-Sep-1955  MRN: [id number]  Age / Sex: 66 y.o., female  PCP: Pcp, No Referring Physician: Kipp Brood, MD  Reason for Consultation:  goals of care  HPI/Patient Profile: 66 y.o. female  with past medical history of heart failure, ECHO in October 2022 EF 20%, CAD, HTN, HLD recent admission 11/7-11/13 for CHF exacerbation, dc'd to SNF now readmitted on 03/17/2022 after being found down at SNF in cardiac arrest.  She received CPR by EMS for approximately 20 minutes she was intubated in the field.  On arrival to hospital she was again pulseless and CPR again for 15 minutes.  After the code she was noticed to be having some seizures there are concerns for anoxic brain injury.  She is in cardiogenic shock she is hypotensive she is on epi, levo currently and dobutamine and remains hypotensive despite these.  On admission she had albumin of 1.97 is up to 2.47 after IV repletion.  She has acute kidney injury likely from cardiogenic shock creatinine up to 2.47 at this point.  She has thrombocytopenia. Palliative medicine consulted for goals of care.  Primary Decision Maker NEXT OF KIN- sister [name] is primary contact  Discussion: Chart reviewed including labs, progress notes, imaging from this and previous encounters.  On my eval patient does not respond to my voice or any stimulation. She has blood coming out of her ears from a scab, she is bleeding from her central line.  There is blood coming out of her mouth.   She has diffuse anasarca. I called and spoke to her sister, [name]. [name] shares her understanding that her sister's heart is very weak and went there is concern for damage to her brain.. I discussed with [name] unfortunately, [name] appears to be dying despite all current interventions.  I do believe at this  point current interventions are only prolonging her dying.  I shared that I am concerned that she will not survive through tonight even with all continued interventions. I discussed options for transition to full comfort measures only, which includes withdrawing life support machine, and life supporting medicines and focusing on patient's comfort and allowing for natural dying process. [name] has been attempting to reach other family members.  She has a sister in [place] that she does not believe would want to come to [place].  She has other brothers who she cannot reach.  There is 1 brother in [place] and [name] was unsure if he was coming to visit. [name] plans to come to the hospital in the next few hours.  She would like to discuss possible transition to comfort measures with provider when she comes.   SUMMARY OF RECOMMENDATIONS   -Patient appears to be actively dying despite current life prolonging interventions -Recommend extubation and transition to full comfort measures only -[name] to come to hospital this evening and would like to discuss with MD  Code Status/Advance Care Planning: DNR   Prognosis:   Hours - Days  Discharge Planning:  anticipated hospital death  Primary Diagnoses: Present on Admission:  Cardiac arrest [facility])   Review of Systems  Unable to perform ROS: Intubated    Physical Exam Vitals and nursing note reviewed.  Constitutional:      Appearance: She is ill-appearing.  HENT:     Ears:     Comments: Pooled blood in left ear Cardiovascular:     Comments: Diffuse anasarca Neurological:     Comments: Unresponsive     Vital Signs: BP 110/65   Pulse 99   Temp 99.3 F (37.4 C)   Resp (!) 28   Wt 81.6 kg   SpO2 93%   BMI 35.13 kg/m  Pain Scale: CPOT   Pain Score: 0-No pain   SpO2: SpO2: 93 % O2 Device:SpO2: 93 % O2 Flow Rate: .   IO: Intake/output summary:  Intake/Output Summary (Last 24 hours) at 03/22/2022 1548 Last data  filed at 03/22/2022 1300 Gross per 24 hour  Intake 2278.55 ml  Output 20 ml  Net 2258.55 ml    LBM:   Baseline Weight: Weight: 81.6 kg Most recent weight: Weight: 81.6 kg       Thank you for this consult. Palliative medicine will continue to follow and assist as needed.   Greater than 50%  of this time was spent counseling and coordinating care related to the above assessment and plan.  Signed by: Ocie Bob, AGNP-C Palliative Medicine    Please contact Palliative Medicine Team phone at [phone] for questions and concerns.  For individual provider: See Loretha Stapler

## 2022-03-22 NOTE — Progress Notes (Signed)
eLink Physician-Brief Progress Note Patient Name: Elizabeth Herring DOB: 07-17-1955 MRN: [id number]   Date of Service  03/22/2022  HPI/Events of Note  Patient with severe cardiomyopathy with systolic dysfunction (EF 20 %) secondary to CAD not amenable to PCI, and HFrEF who was discharged to SNF earlier today and returned with out of hospital cardiac arrest s/p ROSC, she is intubated and mechanically ventilated, as well as being on inotrope / pressors.  eICU Interventions  New Patient Evaluation.        Thomasene Lot Gauri Galvao 03/22/2022, 4:21 AM

## 2022-03-22 NOTE — ED Notes (Signed)
Pt having full body seizure ~47min. Oxygen sats at 45%. IV ativan given per order

## 2022-03-22 NOTE — Progress Notes (Signed)
eLink Physician-Brief Progress Note Patient Name: Elizabeth Herring DOB: [birth year] MRN: [id number]   Date of Service  03/22/2022  HPI/Events of Note  Patient on 124 mcg of Levophed gtt with BP  97/70.  eICU Interventions  Vasopressin gtt started to allow weaning of Norepinephrine gtt down to more usual dose range.        Thomasene Lot Ryler Laskowski 03/22/2022, 10:21 PM

## 2022-03-22 NOTE — ED Notes (Signed)
Dr.Gelmann called to bedside for cardiac [place]; significant drop in blood pressure, no palpable pulses. US showing cardiac movement

## 2022-03-22 NOTE — ED Notes (Signed)
Continuous bleeding from central line site to R side of next. Dr.Gonzales at bedside, central line removed from R side of next with RN holding pressure. Central line placed to L side of next

## 2022-03-22 NOTE — Progress Notes (Addendum)
Pharmacy Antibiotic Note  Buena Boehm is s/p cardiac arrest on multiple pressors with possible aspiration pneumonia. Pharmacy consulted for cefepime and vancomycin -WBC= 10.3, afebrile, PCT 5.35 Body mass index is 35.13 kg/m.  Plan: -Cefepime 2gm IV q24hr -Vancomycin 1750mg  IV x1 followed by 750mg  IV q48hr (estimated AUC= 516) -Will follow renal function, cultures and clinical progress    Weight: 81.6 kg (179 lb 14.3 oz)  Temp (24hrs), Avg:92.7 F (33.7 C), Min:91.9 F (33.3 C), Max:94.9 F (34.9 C)  Recent Labs  Lab 03/15/22 1307 03/16/22 0428 03/17/22 0356 03/19/22 0525 03/20/22 0429 2022-03-27 0351 27-Mar-2022 2240 03/22/22 0040 03/22/22 0809  WBC 7.8 8.5  --   --   --   --  10.3  --   --   CREATININE 1.58* 1.38*   < > 1.21* 1.22* 1.64* 2.45* 2.47*  --   LATICACIDVEN  --   --   --   --   --   --   --   --  3.1*   < > = values in this interval not displayed.    Estimated Creatinine Clearance: 21.5 mL/min (A) (by C-G formula based on SCr of 2.47 mg/dL (H)).    Allergies  Allergen Reactions   Chlorhexidine     Thank you for allowing pharmacy to be a part of this patient's care.  03/24/22, PharmD Clinical Pharmacist **Pharmacist phone directory can now be found on [email or web address] ([email or web address]).  Listed under [facility].

## 2022-03-22 NOTE — ED Notes (Signed)
Quick clot applied to central line insertion site on L side of next, continuous oozing noted.

## 2022-03-22 NOTE — Procedures (Signed)
Patient Name: Perrin Eddleman  MRN: [id number]  Epilepsy Attending: Charlsie Quest  Referring Physician/Provider: Gordy Councilman, MD  Duration: 03/22/2022 0426 to 04/22/22 0426  Patient history: 66 year old female with right-sided facial twitching and right head turn.  EEG to evaluate for seizure.  Level of alertness: lethargic /sedated  AEDs during EEG study: LEV  Technical aspects: This EEG study was done with scalp electrodes positioned according to the 10-20 International system of electrode placement. Electrical activity was reviewed with band pass filter of 1-70Hz , sensitivity of 7 uV/mm, display speed of 70mm/sec with a 60Hz  notched filter applied as appropriate. EEG data were recorded continuously and digitally stored.  Video monitoring was available and reviewed as appropriate.  Description: EEG showed continuous generalized low amplitude 2-3Hz  delta slowing admixed with 8-9hz  alpha activity. Hyperventilation and photic stimulation were not performed.     ABNORMALITY - Continuous slow, generalized  IMPRESSION: This study is suggestive of severe diffuse encephalopathy, nonspecific etiology. No seizures or epileptiform discharges were seen throughout the recording.  Tirrell Buchberger 

## 2022-03-22 NOTE — Consult Note (Signed)
Neurology Consultation Reason for Consult: Seizure activity and concern for anoxic injury after cardiac arrest Requesting Physician: Peterson Lombard  CC: Cardiac arrest  History is obtained from: Chart review and medical team at bedside  HPI: Elizabeth Dobias is a 66 y.o. female with a past medical history significant for end-stage congestive heart failure (EF 20% October 2020 coronary artery disease, hypertension, recent GI bleed (March 2023), ongoing tobacco abuse, hypertension, hyperlipidemia, CKD  In review of PT notes, she was bedbound with recent discharge, not able to tolerate even sitting on 03/18/2022.  She was discharged to rehab, and had last been seen about 1 hour before she was found pulseless and apneic.  CPR on EMS arrival with ROSC after 1 round per notes, and required intubation in the field but required further CPR here for approximately 15 minutes.  Seizure activity witnessed after code complete.  Right-sided facial twitching with right head turn, aborted with Ativan.  Neurology consulted for the same  LKW: Approximately 2000 Thrombolytic given?: No, low platelets IA performed?: No, baseline functional status Premorbid modified rankin scale:      5 - Severe disability. Requires constant nursing care and attention, bedridden, incontinent.  ROS:  Unable to obtain due to altered mental status.   Past Medical History:  Diagnosis Date   Coronary artery disease    Severe multivessel disease with unsuccessful PCI attempt and medical management 07/2020   HFrEF (heart failure with reduced ejection fraction) (HCC)    Hyperlipidemia    Hypertension    Ischemic cardiomyopathy    Noncompliance    NSVT (nonsustained ventricular tachycardia) (HCC)    ST elevation myocardial infarction (STEMI) of inferior wall [facility])    Past Surgical History:  Procedure Laterality Date   COLONOSCOPY WITH PROPOFOL N/A 08/03/2021   Procedure: COLONOSCOPY WITH PROPOFOL;  Surgeon: Charna Elizabeth,  MD;  Location: [facility] ENDOSCOPY;  Service: Gastroenterology;  Laterality: N/A;   CORONARY/GRAFT ACUTE MI REVASCULARIZATION N/A 07/15/2020   Procedure: Coronary/Graft Acute MI Revascularization;  Surgeon: Alwyn Pea, MD;  Location: [facility] INVASIVE CV LAB;  Service: Cardiovascular;  Laterality: N/A;   ESOPHAGOGASTRODUODENOSCOPY (EGD) WITH PROPOFOL N/A 08/03/2021   Procedure: ESOPHAGOGASTRODUODENOSCOPY (EGD) WITH PROPOFOL;  Surgeon: Charna Elizabeth, MD;  Location: [facility] ENDOSCOPY;  Service: Gastroenterology;  Laterality: N/A;  Guaiac positive stools   GIVENS CAPSULE STUDY N/A 08/03/2021   Procedure: GIVENS CAPSULE STUDY;  Surgeon: Charna Elizabeth, MD;  Location: [facility] [place] ENDOSCOPY;  Service: Gastroenterology;  Laterality: N/A;   LEFT HEART CATH AND CORONARY ANGIOGRAPHY N/A 07/15/2020   Procedure: LEFT HEART CATH AND CORONARY ANGIOGRAPHY;  Surgeon: Alwyn Pea, MD;  Location: [facility] INVASIVE CV LAB;  Service: Cardiovascular;  Laterality: N/A;   Current Outpatient Medications  Medication Instructions   aspirin EC 81 mg, Oral, Daily, Swallow whole. Resume on 08/12/2021   atorvastatin (LIPITOR) 80 MG tablet TAKE ONE TABLET BY MOUTH ONCE EVERY DAY.   clopidogrel (PLAVIX) 75 MG tablet Take 1 tablet (75 mg total) by mouth once daily. Resume on 08/12/2021   dapagliflozin propanediol (FARXIGA) 10 mg, Oral, Daily with breakfast, Please keep appointment for further refills   montelukast (SINGULAIR) 10 MG tablet Take 1 tablet by mouth once daily.   Multiple Vitamin (MULTI-VITAMIN) tablet 1 tablet, Oral, Daily   Olopatadine HCl (PATADAY) 0.2 % SOLN Instill 1 drop in both eyes once daily.   potassium chloride (KLOR-CON) 10 MEQ tablet Take 1 tablet (10 mEq total) by mouth once daily.   Torsemide 40 mg, Oral, Daily  Family History  Problem Relation Age of Onset   Hypertension Father     Social History:  reports that she has been smoking cigarettes. She has been smoking an average of 1 pack per day. She has never used  smokeless tobacco. She reports current alcohol use. She reports that she does not use drugs.   Exam: Current vital signs: BP (!) 139/114   Pulse 83   Temp (!) 94.9 F (34.9 C) (Temporal)   Resp (!) 0   SpO2 100%  Vital signs in last 24 hours: Temp:  [94.9 F (34.9 C)] 94.9 F (34.9 C) (11/14 0048) Pulse Rate:  [46-87] 83 (11/14 0145) Resp:  [0-20] 0 (11/14 0215) BP: (113-139)/(74-114) 139/114 (11/13 2306) SpO2:  [79 %-100 %] 100 % (11/14 0145) Arterial Line BP: (70-119)/(37-75) 98/56 (11/14 0215) FiO2 (%):  [100 %] 100 % (11/13 2306) Weight:  [77.6 kg] 77.6 kg (11/13 1000)  Physical Exam  Constitutional: Appears chronically ill  Psych: Minimally interactive Eyes: Scleral edema is present in the right eye  HENT: ET tube in place MSK: Edematous throughout Cardiovascular: Intermittently brady to 50s  Respiratory: Breathing comfortably on the ventilator,  GI: No distension. There is no tenderness.  Skin: Edematous throughout. Sternal bruising from prolonged CPR    Neuro: Mental Status: Does not open eyes spontaneously, to voice or noxious stimulation Does not follow any commands Cranial Nerves: II: No blink to threat. Pupils are equal, round, and reactive to light 2 mm to 1 mm, sluggish III,IV, VI/VIII: VOR not tested as central line is using and nursing is holding pressure V/VII: No blink to bilateral corneal stimulation with gauze VIII: No response to voice X/XI: Intermittent swallowing movement episode spontaneously XII: Unable to assess tongue protrusion secondary to patient's mental status  Motor/Sensory: Bilateral extensor posturing in the upper extremities and trace triple flexion in the lower extremities to proximal noxious stimulation, which is somewhat limited due to her leg wounds and severe edema  I have reviewed labs in epic and the results pertinent to this consultation are:  Basic Metabolic Panel: Recent Labs  Lab 03/17/22 0724 03/18/22 0324  03/19/22 0525 03/20/22 0429 04/05/2022 0350 03/20/2022 0351 03/13/2022 2240 03/22/22 0040 03/22/22 0050  NA  --  134* 135 129*  --  129* 127* 128* 125*  K  --  2.8* 3.6 3.5  --  4.3 5.2* 4.3 4.4  CL  --  96* 94* 92*  --  92* 91* 89*  --   CO2  --  27 31 29   --  26 21* 23  --   GLUCOSE  --  142* 104* 135*  --  105* 86 104*  --   BUN  --  34* 29* 29*  --  34* 38* 38*  --   CREATININE  --  1.24* 1.21* 1.22*  --  1.64* 2.45* 2.47*  --   CALCIUM  --  8.2* 7.9* 7.2*  --  7.2* 6.9* 6.9*  --   MG 2.3 2.0  --   --  2.2  --  2.3  --   --     CBC: Recent Labs  Lab 03/15/22 1307 03/16/22 0428 03/16/2022 2240 03/22/22 0050  WBC 7.8 8.5 10.3  --   HGB 13.6 12.9 11.5* 11.9*  HCT 41.3 38.5 33.3* 35.0*  MCV 100.2* 98.5 96.8  --   PLT 185 173 73*  --     Coagulation Studies: Recent Labs    03/22/22 0040  LABPROT 18.3*  INR 1.5*     I have reviewed the images obtained: Head CT no acute intracranial process on my read, radiology read pending  Impression: Focal seizure like activity, no clear etiology at this time, but not typical for postcardiac arrest anoxic brain injury seizures (as those are typically myoclonic in nature).  Recommendations: - STAT head CT - Keppra 2000 mg load, then 500 mg BID. Adjust as needed for renal function:  Estimated Creatinine Clearance: 20.9 mL/min (A) (by C-G formula based on SCr of 2.47 mg/dL (H)).   CrCl 80 to 130 mL/minute/1.73 m2: 500 mg to 1.5 g every 12 hours.  CrCl 50 to <80 mL/minute/1.73 m2: 500 mg to 1 g every 12 hours.  CrCl 30 to <50 mL/minute/1.73 m2: 250 to 750 mg every 12 hours.  CrCl 15 to <30 mL/minute/1.73 m2: 250 to 500 mg every 12 hours.  CrCl <15 mL/minute/1.73 m2: 250 to 500 mg every 24 hours (expert opinion). - Overnight long-term EEG with regular leads, anticipate she will not be cardiac stable for some time and MRI will be delayed - Neurology will continue to follow   Brooke Dare MD-PhD [facility] [phone] Available 7 PM to 7 AM, outside of these hours please call Neurologist on call as listed on Amion.    Total critical care time: 45 minutes   Critical care time was exclusive of separately billable procedures and treating other patients.   Critical care was necessary to treat or prevent imminent or life-threatening deterioration.   Critical care was time spent personally by me on the following activities: development of treatment plan with patient and/or surrogate as well as nursing, discussions with consultants/primary team, evaluation of patient's response to treatment, examination of patient, obtaining history from patient or surrogate, ordering and performing treatments and interventions, ordering and review of laboratory studies, ordering and review of radiographic studies, and re-evaluation of patient's condition as needed, as documented above.

## 2022-03-22 NOTE — ED Notes (Signed)
Bleeding noted to be around dressing to L side of neck

## 2022-03-22 NOTE — Progress Notes (Signed)
Maintenance complete, all impeds under 10, no skin breakdown at all skin sites. 

## 2022-03-22 NOTE — Progress Notes (Signed)
Several nursing staff members at bedside getting the patient situated. EEG Tech on standby

## 2022-03-22 NOTE — Progress Notes (Addendum)
NAME:  Elizabeth Herring, MRN:  [id number], DOB:  06/18/1955, LOS: 0 ADMISSION DATE:  03/11/2022, CONSULTATION DATE:  04/02/2022  REFERRING MD:  Vanita Panda, CHIEF COMPLAINT:  found down    History of Present Illness:  66 yo woman with hx of HFrEF, RV dysfunction, HTN, HLD, CAD, s/p unsuccessful PCI, recently admitted for leg swelling and CHF exacerbation, discharged today to SNF, returns after cardiac arrest today.    Had last been seen one our prior to being found down and pulseless at SNF.   ROSC after 1 round of CPR by EMS, Intubation in the field.   No pulses on arrival to ED.   Cardiac arrest for an additional 15 min until ROSC.  Art line placed in ED, Central line placed in ED.     Discharged today with plan to "follow up with palliative care asap" continue diuretics for end stage heart failure, cont B LE wound care, cont 3L [place] oxygen (new).  Last EF 20% 10/22, RV dysfunction.  Pulmonic valve vegetation? (0.6 x 0.4 mobile echodensity) D/c on farxiga.  Some hypotension during admission. elevated bili.   Pertinent  Medical History  HFrEF,  RV dysfunction,  HTN,  HLD,  CAD,  s/p unsuccessful PCI,  recently admitted for leg swelling and CHF exacerbation CKD Transaminitis Hyponatremia hypoalbuminemia  Meds: asa, lipitor, farxiga, plavix, signulair, olopatadine 1 gtt both eyes qd, torsemide.   Significant Hospital Events: Including procedures, antibiotic start and stop dates in addition to other pertinent events   11/13 admitted post arrest. Shock requiring pressors. Empiric abx started 11/14 CT chest CM, mild interstitial edema. Small basilar effusions. CT head negative for acute changes. Vanc and cefepime started. DNR confirmed w/ sister   Interim History / Subjective:  On high dose pressors   Objective   Blood pressure 114/62, pulse 85, temperature (Abnormal) 92.8 F (33.8 C), resp. rate 19, weight 81.6 kg, SpO2 98 %.    Vent Mode: PRVC FiO2 (%):  [70 %-100 %] 70  % Set Rate:  [20 bmp-26 bmp] 26 bmp Vt Set:  [360 mL-500 mL] 360 mL PEEP:  [5 cmH20] 5 cmH20 Plateau Pressure:  [26 cmH20-31 cmH20] 26 cmH20  No intake or output data in the 24 hours ending 03/22/22 0714 Filed Weights   03/22/22 0500  Weight: 81.6 kg    Examination: General chronically. Now critically ill 66 year old female on multiple pressors, inotrope and diuretic support HENT NCAT orally intubated Pulm coarse scattered rhonchi CT chest w/ R>L effusion and right atx vis PNA  Card tachy RRR Abd soft Ext diffuse anasarca. Multiple dressings on LEs Neuro unresponsive on vent   Resolved Hospital Problem list     Assessment & Plan:    Cardiac arrest, known h/o CAD and failed PCI Shock c/w cardiogenic shock.  CAD vs PE vs progressive CHF.  Elevated Trop. Had echodensity on pulmonic valve on prior echo.  Concern for RV failure > LV failure given good PaO2 being >200, extrem LE edema.   Plan Cont to titrate NE for MAP >65 Epi (maxed) Cont dobutamine at current  Serial CVPs Cont IV lasix Getting LE [place]  DNR if arrests  Acute Hypoxic respiratory failure s/p cardiopulmonary arrest w/ R>L effusion and RLL atx vs PNA Plan Cont full vent support  VAP bundle  PAD protocol RASS goal 0 Sputum culture  HCAP coverage Wean PEEP//FIO2  Acute metabolic encephalopathy. W/ seizure.  Seen by neurology. Seizure etiology not clear.  Plan LTM per neuro Cont  Keppra Serial neuro checks  Thrombocytopenia:  Plan Cont to monitor  Holding [facility]  Hypothermia Plan Bair hugger  CKD Cr  2.47  Plan Serial chems Strict I&O Maximize CO Discussed w/ sister Not HD candidate   Fluid & Electrolyte imbalance: hyponatremia,  Plan Cont IV lasix Serial chems   Transaminitis Plan Monitor   Hypoalbuminemia and protein calorie malnutrition  Plan Start tubefeeds in am if stable   Best Practice (right click and "Reselect all SmartList Selections" daily)   Diet/type: NPO DVT  prophylaxis: SCD none given Thrombocytopenia  GI prophylaxis: PPI Lines: Central line Foley:  N/A Code Status:  DNR Last date of multidisciplinary goals of care discussion [  Spoke w/ sister at bedside. DNR if arrests   Critical care time:    Simonne Martinet ACNP-BC [facility] Pulmonary/Critical Care Pager # [phone] OR # [phone] if no answer

## 2022-03-22 NOTE — ED Provider Notes (Addendum)
[name] EMERGENCY DEPARTMENT Provider Note   CSN: [id number] Arrival date & time: 04/06/2022  2226     History  Chief Complaint  Patient presents with   Cardiac Arrest    Elizabeth Herring is a 66 year old female with history of HFrEF, RV dysfunction, HTN, HLD, CAD, s/p unsuccessful PCI who presents to the ED for cardiac arrest. The patient is brought in by EMS from SNF. She was just discharged yesterday for CHF exacerbation. At that time her EF was 20%.  This evening staff found her unresponsive and without a pulse or notable respirations, unsure of downtime but thought to be approximately 1 hour since she was last seen normal.  EMS started CPR, rhythm was asystole, achieved ROSC after 1 dose of epinephrine.  Patient was intubated in the field with EMS.  The history is provided by the EMS personnel.  Cardiac Arrest      Home Medications Prior to Admission medications   Medication Sig Start Date End Date Taking? Authorizing Provider  aspirin EC 81 MG tablet Take 1 tablet (81 mg total) by mouth daily. Swallow whole. Resume on 08/12/2021 08/04/21 08/04/22  Erick Blinks, MD  atorvastatin (LIPITOR) 80 MG tablet TAKE ONE TABLET BY MOUTH ONCE EVERY DAY. 03/03/21 03/03/22  Alver Sorrow, NP  clopidogrel (PLAVIX) 75 MG tablet Take 1 tablet (75 mg total) by mouth once daily. Resume on 08/12/2021 08/04/21   Erick Blinks, MD  dapagliflozin propanediol (FARXIGA) 10 MG TABS tablet Take 1 tablet (10 mg total) by mouth daily with breakfast. Please keep appointment for further refills 01/17/22   Antonieta Iba, MD  montelukast (SINGULAIR) 10 MG tablet Take 1 tablet by mouth once daily. 06/14/21     Multiple Vitamin (MULTI-VITAMIN) tablet Take 1 tablet by mouth daily.    [provider]  Olopatadine HCl (PATADAY) 0.2 % SOLN Instill 1 drop in both eyes once daily. 06/14/21     potassium chloride (KLOR-CON) 10 MEQ tablet Take 1 tablet (10 mEq total) by mouth once  daily. 01/14/22   Antonieta Iba, MD  torsemide 40 MG TABS Take 40 mg by mouth daily. 03/25/2022   Kendell Bane, MD      Allergies    Chlorhexidine    Review of Systems   Review of Systems  Physical Exam Updated Vital Signs BP (!) 139/114   Pulse 83   Temp (!) 94.9 F (34.9 C) (Temporal)   Resp (!) 0   SpO2 100%  Physical Exam Constitutional:      Comments: Ill-appearing, unresponsive.  Intubated.  HENT:     Head: Normocephalic and atraumatic.  Cardiovascular:     Rate and Rhythm: Bradycardia present.  Pulmonary:     Comments: Coarse breath sounds bilaterally. Abdominal:     Palpations: Abdomen is soft.  Musculoskeletal:     Right lower leg: Edema present.     Left lower leg: Edema present.     Comments: 3+ pitting edema to hip bilaterally.  Neurological:     Comments: GCS 3 T.     ED Results / Procedures / Treatments   Labs (all labs ordered are listed, but only abnormal results are displayed) Labs Reviewed  BASIC METABOLIC PANEL - Abnormal; Notable for the following components:      Result Value   Sodium 127 (*)    Potassium 5.2 (*)    Chloride 91 (*)    CO2 21 (*)    BUN 38 (*)  Creatinine, Ser 2.45 (*)    Calcium 6.9 (*)    GFR, Estimated 21 (*)    All other components within normal limits  CBC - Abnormal; Notable for the following components:   RBC 3.44 (*)    Hemoglobin 11.5 (*)    HCT 33.3 (*)    RDW 20.3 (*)    Platelets 73 (*)    nRBC 8.4 (*)    All other components within normal limits  BRAIN NATRIURETIC PEPTIDE - Abnormal; Notable for the following components:   B Natriuretic Peptide 855.3 (*)    All other components within normal limits  COMPREHENSIVE METABOLIC PANEL - Abnormal; Notable for the following components:   Sodium 128 (*)    Chloride 89 (*)    Glucose, Bld 104 (*)    BUN 38 (*)    Creatinine, Ser 2.47 (*)    Calcium 6.9 (*)    Total Protein 5.5 (*)    Albumin 1.9 (*)    AST 142 (*)    Alkaline Phosphatase 222 (*)     Total Bilirubin 4.8 (*)    GFR, Estimated 21 (*)    Anion gap 16 (*)    All other components within normal limits  PROTIME-INR - Abnormal; Notable for the following components:   Prothrombin Time 18.3 (*)    INR 1.5 (*)    All other components within normal limits  CBG MONITORING, ED - Abnormal; Notable for the following components:   Glucose-Capillary 109 (*)    All other components within normal limits  I-STAT ARTERIAL BLOOD GAS, ED - Abnormal; Notable for the following components:   pO2, Arterial 221 (*)    Bicarbonate 28.1 (*)    Acid-Base Excess 3.0 (*)    Sodium 125 (*)    Calcium, Ion 0.88 (*)    HCT 35.0 (*)    Hemoglobin 11.9 (*)    All other components within normal limits  TROPONIN I (HIGH SENSITIVITY) - Abnormal; Notable for the following components:   Troponin I (High Sensitivity) 981 (*)    All other components within normal limits  TROPONIN I (HIGH SENSITIVITY) - Abnormal; Notable for the following components:   Troponin I (High Sensitivity) 1,270 (*)    All other components within normal limits  SARS CORONAVIRUS 2 BY RT PCR  CULTURE, BLOOD (ROUTINE X 2)  CULTURE, BLOOD (ROUTINE X 2)  MAGNESIUM  PROCALCITONIN  URINALYSIS, ROUTINE W REFLEX MICROSCOPIC  STREP PNEUMONIAE URINARY ANTIGEN  BLOOD GAS, ARTERIAL  LEGIONELLA PNEUMOPHILA SEROGP 1 UR AG  COOXEMETRY PANEL  BLOOD GAS, VENOUS  CBG MONITORING, ED  PREPARE PLATELET PHERESIS    EKG None  Radiology DG CHEST PORT 1 VIEW  Result Date: 03/22/2022 CLINICAL DATA:  Central line placement EXAM: PORTABLE CHEST 1 VIEW COMPARISON:  03/22/2022 at 0005 hours FINDINGS: Endotracheal tube terminates 10 mm above the carina. Left IJ venous catheter terminates in the mid SVC. Enteric tube courses into the stomach. Moderate layering right pleural effusion. Left lung is clear. No pneumothorax. The heart is normal in size. Defibrillator pads overlying the left hemithorax. IMPRESSION: Left IJ venous catheter terminates in  the mid SVC. No pneumothorax. Endotracheal tube terminates 10 mm above the carina. Moderate layering right pleural effusion. Electronically Signed   By: Julian Hy M.D.   On: 03/22/2022 02:14   DG Chest Portable 1 View  Result Date: 03/22/2022 CLINICAL DATA:  chest pain EXAM: PORTABLE CHEST 1 VIEW COMPARISON:  Chest x-ray 03/15/2022. FINDINGS: Interval placement of  an endotracheal tube with tip in the proximal left mainstem bronchi. Enteric tube coursing below the hemidiaphragm with tip and side port collimated off view. Interval placement of a right internal jugular central venous catheter with tip overlying the expected region of the proximal superior vena cava. Limited evaluation due to patient rotation. Cardiac paddles overlie the chest. The heart and mediastinal contours are within normal limits. No focal consolidation. No pulmonary edema. At least small to moderate volume right pleural effusion. No pneumothorax. No acute osseous abnormality. IMPRESSION: 1. Left mainstem bronchi intubation. Recommend retraction of endotracheal tube by 2 cm. 2. Interval placement of a right internal jugular central venous catheter with tip overlying the expected region of the proximal superior vena cava. Limited evaluation due to patient rotation. Recommend attention on follow-up. 3. Enteric tube coursing below the hemidiaphragm with tip and side port collimated off view. 4. At least small to moderate volume right pleural effusion. These results were called by telephone at the time of interpretation on 03/22/2022 at 12:24 am to provider Carmin Muskrat , who verbally acknowledged these results. Electronically Signed   By: Iven Finn M.D.   On: 03/22/2022 00:24    Procedures ARTERIAL LINE  Date/Time: 03/22/2022 10:44 AM  Performed by: Renard Matter, MD Authorized by: Carmin Muskrat, MD   Consent:    Consent obtained:  Emergent situation Universal protocol:    Imaging studies available: yes      Required blood products, implants, devices, and special equipment available: yes     Site/side marked: yes     Immediately prior to procedure, a time out was called: yes     Patient identity confirmed:  Provided demographic data Indications:    Indications: hemodynamic monitoring   Pre-procedure details:    Skin preparation:  Alcohol Procedure details:    Location:  L radial   Number of attempts:  2   Transducer: waveform confirmed   Post-procedure details:    Post-procedure:  Secured with tape and biopatch applied   Procedure completion:  Tolerated well, no immediate complications .Central Line  Date/Time: 03/12/2022 11:15 PM  Performed by: Renard Matter, MD Authorized by: Carmin Muskrat, MD   Consent:    Consent obtained:  Emergent situation Universal protocol:    Imaging studies available: yes     Required blood products, implants, devices, and special equipment available: yes     Site/side marked: yes     Immediately prior to procedure, a time out was called: yes     Patient identity confirmed:  Provided demographic data Pre-procedure details:    Indication(s): central venous access     Hand hygiene: Hand hygiene performed prior to insertion     Sterile barrier technique: All elements of maximal sterile technique followed     Skin preparation:  Chlorhexidine   Skin preparation agent: Skin preparation agent completely dried prior to procedure   Procedure details:    Location:  R internal jugular   Procedural supplies:  Triple lumen   Landmarks identified: yes     Ultrasound guidance: yes     Number of attempts:  2 Post-procedure details:    Post-procedure:  Dressing applied and line sutured   Assessment:  Blood return through all ports, no pneumothorax on x-ray, placement verified by x-ray and free fluid flow   Procedure completion:  Tolerated well, no immediate complications       Medications Ordered in ED Medications  EPINEPHrine (ADRENALIN) 1 MG/10ML  injection (1 mg Intravenous Given 03/18/2022 2231)  EPINEPHrine (ADRENALIN) 5 mg in NS 250 mL (0.02 mg/mL) premix infusion (20 mcg/min Intravenous Rate/Dose Change 03/22/22 0152)  EPINEPHrine (ADRENALIN) 1 MG/10ML injection (1 mg Intravenous Given 03/26/2022 2238)  DOBUTamine (DOBUTREX) infusion 4000 mcg/mL (5 mcg/kg/min  77.6 kg Intravenous Rate/Dose Change 03/22/22 0016)  norepinephrine (LEVOPHED) 4mg  in 240mL (0.016 mg/mL) premix infusion (20 mcg/min Intravenous Rate/Dose Change 03/22/22 0016)  sodium bicarbonate injection (50 mEq Intravenous Given 03/22/22 0024)  furosemide (LASIX) injection 80 mg (has no administration in time range)  furosemide (LASIX) 200 mg in dextrose 5 % 100 mL (2 mg/mL) infusion (has no administration in time range)  docusate (COLACE) 50 MG/5ML liquid 100 mg (has no administration in time range)  polyethylene glycol (MIRALAX / GLYCOLAX) packet 17 g (has no administration in time range)  pantoprazole (PROTONIX) injection 40 mg (has no administration in time range)  calcium gluconate 1 g/ 50 mL sodium chloride IVPB (has no administration in time range)  0.9 %  sodium chloride infusion (Manually program via Guardrails IV Fluids) (has no administration in time range)    ED Course/ Medical Decision Making/ A&P                           Medical Decision Making Problems Addressed: Acute on chronic heart failure, unspecified heart failure type [facility]): acute illness or injury that poses a threat to life or bodily functions Cardiac arrest [facility]): acute illness or injury that poses a threat to life or bodily functions  Amount and/or Complexity of Data Reviewed Independent Historian: EMS External Data Reviewed: labs and notes. Labs: ordered. Decision-making details documented in ED Course. Radiology: ordered and independent interpretation performed. Decision-making details documented in ED Course.  Risk Prescription drug management. Decision regarding  hospitalization.   Elizabeth Herring is a 66 y.o. female with a history of HFrEF, RV dysfunction, HTN, HLD, CAD, s/p unsuccessful PCI who presents to the emergency department for cardiac arrest. Per EMS, patient found down at facility.  CPR performed for 9 minutes with EMS, rhythm initially asystole, epi x1 given.  On arrival, patient without palpable pulses and compressions restarted. CPR performed for another 15 minutes and ultimately ROSC achieved. Throughout rhythm was PEA. There was some cardiac motion on bedside echo but no palpable pulse. After placement of arterial line BP was confirmed and repeat bedside echo confirmed organized cardiac motion with severely reduced EF.    Given history of CHF and significant edema on exam, concern for decompensated heart failure. Feel septic shock, PE, ACS all less likely. No septal bowing or D sign on echo to suggest PE. No significant pericardial effusion on echo. Lung sliding present bilaterally, no evidence of PTX. Patient initiated on epinephrine drip, levophed drip, and dobutamine in ED. ROSC maintained in ED. Central line placed in ED with difficulty threading the wire requiring 2 attempts, suspect related to anatomy.    EKG with low voltage, no STEMI.    Labs show Hgb 11.5, plt 73, WBC 10.3. CMP with Na 127, K 5.2, Cl 91, CO2 21, Cr 2.45. LFTs and bili elevated consistent with shock. Suspect troponin of 981 secondary to shock as well as compressions, although patient does have history of CAD with unsuccessful PCI so ACS this could remain a consideration.    CXR shows L mainstem intubation, ETT adjusted, no PTX on my independent review.   Lasix additionally ordered in ED. Patient admitted to Critical Care.    Final Clinical Impression(s) / ED Diagnoses  Final diagnoses:  Cardiac arrest (HCC)  Acute on chronic heart failure, unspecified heart failure type [facility])    Rx / DC Orders ED Discharge Orders     None          Stephanie Coup, MD 03/22/22 6834    Gerhard Munch, MD 03/22/22 2121

## 2022-03-23 DIAGNOSIS — I9589 Other hypotension: Secondary | ICD-10-CM

## 2022-03-23 DIAGNOSIS — I509 Heart failure, unspecified: Secondary | ICD-10-CM

## 2022-03-23 DIAGNOSIS — I469 Cardiac arrest, cause unspecified: Secondary | ICD-10-CM | POA: Diagnosis not present

## 2022-03-23 DIAGNOSIS — R4182 Altered mental status, unspecified: Secondary | ICD-10-CM | POA: Diagnosis not present

## 2022-03-23 LAB — PREPARE PLATELET PHERESIS: Unit division: 0

## 2022-03-23 LAB — CBC
HCT: 30.3 % — ABNORMAL LOW (ref 36.0–46.0)
Hemoglobin: 10.7 g/dL — ABNORMAL LOW (ref 12.0–15.0)
MCH: 32 pg (ref 26.0–34.0)
MCHC: 35.3 g/dL (ref 30.0–36.0)
MCV: 90.7 fL (ref 80.0–100.0)
Platelets: 58 10*3/uL — ABNORMAL LOW (ref 150–400)
RBC: 3.34 MIL/uL — ABNORMAL LOW (ref 3.87–5.11)
RDW: 18.5 % — ABNORMAL HIGH (ref 11.5–15.5)
WBC: 11.3 10*3/uL — ABNORMAL HIGH (ref 4.0–10.5)
nRBC: 17.1 % — ABNORMAL HIGH (ref 0.0–0.2)

## 2022-03-23 LAB — BASIC METABOLIC PANEL
Anion gap: 12 (ref 5–15)
BUN: 43 mg/dL — ABNORMAL HIGH (ref 8–23)
CO2: 20 mmol/L — ABNORMAL LOW (ref 22–32)
Calcium: 5.6 mg/dL — CL (ref 8.9–10.3)
Chloride: 98 mmol/L (ref 98–111)
Creatinine, Ser: 2.52 mg/dL — ABNORMAL HIGH (ref 0.44–1.00)
GFR, Estimated: 21 mL/min — ABNORMAL LOW (ref >60)
Glucose, Bld: 72 mg/dL (ref 70–99)
Potassium: 4.5 mmol/L (ref 3.5–5.1)
Sodium: 130 mmol/L — ABNORMAL LOW (ref 135–145)

## 2022-03-23 LAB — MAGNESIUM: Magnesium: 2 mg/dL (ref 1.7–2.4)

## 2022-03-23 LAB — GLUCOSE, CAPILLARY
Glucose-Capillary: 74 mg/dL (ref 70–99)
Glucose-Capillary: <10 mg/dL — CL (ref 70–99)
Glucose-Capillary: 36 mg/dL — CL (ref 70–99)

## 2022-03-23 LAB — BPAM PLATELET PHERESIS
Blood Product Expiration Date: 202311142359
ISSUE DATE / TIME: 202311140252
Unit Type and Rh: 6200

## 2022-03-23 LAB — PROCALCITONIN: Procalcitonin: 36.88 ng/mL

## 2022-03-23 MED ORDER — GLYCOPYRROLATE 0.2 MG/ML IJ SOLN
0.2000 mg | INTRAMUSCULAR | Status: DC | PRN
  Administered 2022-03-23: 0.2 mg via INTRAVENOUS
  Filled 2022-03-23: qty 1

## 2022-03-23 MED ORDER — MORPHINE BOLUS VIA INFUSION: 5.0000 mg | INTRAVENOUS | Status: DC | PRN

## 2022-03-23 MED ORDER — SODIUM CHLORIDE 0.9 % IV SOLN: INTRAVENOUS | Status: DC

## 2022-03-23 MED ORDER — DEXTROSE 50 % IV SOLN
1.0000 | Freq: Once | INTRAVENOUS | Status: AC
  Administered 2022-03-23: 50 mL via INTRAVENOUS

## 2022-03-23 MED ORDER — GLYCOPYRROLATE 0.2 MG/ML IJ SOLN: 0.2000 mg | INTRAMUSCULAR | Status: DC | PRN

## 2022-03-23 MED ORDER — MORPHINE 100MG IN NS 100ML (1MG/ML) PREMIX INFUSION
0.0000 mg/h | INTRAVENOUS | Status: DC
  Administered 2022-03-23: 5 mg/h via INTRAVENOUS
  Filled 2022-03-23: qty 100

## 2022-03-23 MED ORDER — DEXTROSE 50 % IV SOLN
INTRAVENOUS | Status: AC
  Filled 2022-03-23: qty 50

## 2022-03-23 MED ORDER — POLYVINYL ALCOHOL 1.4 % OP SOLN: 1.0000 [drp] | Freq: Four times a day (QID) | OPHTHALMIC | Status: DC | PRN

## 2022-03-23 MED ORDER — GLYCOPYRROLATE 1 MG PO TABS
1.0000 mg | ORAL_TABLET | ORAL | Status: DC | PRN
  Filled 2022-03-23: qty 1

## 2022-03-25 LAB — CULTURE, RESPIRATORY W GRAM STAIN: Gram Stain: NONE SEEN

## 2022-04-08 NOTE — Progress Notes (Signed)
LTM D/C'd. No skin breakdown noted. [facility] was notified.  ?

## 2022-04-08 NOTE — Progress Notes (Signed)
NAME:  Lorenia Hoston, MRN:  [id number], DOB:  Sep 19, 1955, LOS: 1 ADMISSION DATE:  2022-04-08, CONSULTATION DATE:  April 08, 2022  REFERRING MD:  Jeraldine Loots, CHIEF COMPLAINT:  found down    History of Present Illness:  66 yo woman with hx of HFrEF, RV dysfunction, HTN, HLD, CAD, s/p unsuccessful PCI, recently admitted for leg swelling and CHF exacerbation, discharged today to SNF, returns after cardiac arrest today.    Had last been seen one our prior to being found down and pulseless at SNF.   ROSC after 1 round of CPR by EMS, Intubation in the field.   No pulses on arrival to ED.   Cardiac arrest for an additional 15 min until ROSC.  Art line placed in ED, Central line placed in ED.     Discharged today with plan to "follow up with palliative care asap" continue diuretics for end stage heart failure, cont B LE wound care, cont 3L [place] oxygen (new).  Last EF 20% 10/22, RV dysfunction.  Pulmonic valve vegetation? (0.6 x 0.4 mobile echodensity) D/c on farxiga.  Some hypotension during admission. elevated bili.   Pertinent  Medical History  HFrEF,  RV dysfunction,  HTN,  HLD,  CAD,  s/p unsuccessful PCI,  recently admitted for leg swelling and CHF exacerbation CKD Transaminitis Hyponatremia hypoalbuminemia  Meds: asa, lipitor, farxiga, plavix, signulair, olopatadine 1 gtt both eyes qd, torsemide.   Significant Hospital Events: Including procedures, antibiotic start and stop dates in addition to other pertinent events   11/13 admitted post arrest. Shock requiring pressors. Empiric abx started 11/14 CT chest CM, mild interstitial edema. Small basilar effusions. CT head negative for acute changes. Vanc and cefepime started. DNR confirmed w/ sister  11/15 progressive organ failure   Interim History / Subjective:  Continues to decline   Objective   Blood pressure (Abnormal) 80/57, pulse 79, temperature (Abnormal) 96.6 F (35.9 C), resp. rate 10, weight 90.2 kg, SpO2 (Abnormal) 89  %. CVP:  [17 mmHg-23 mmHg] 22 mmHg  Vent Mode: PRVC FiO2 (%):  [70 %] 70 % Set Rate:  [26 bmp] 26 bmp Vt Set:  [360 mL] 360 mL PEEP:  [5 cmH20] 5 cmH20 Plateau Pressure:  [25 cmH20-30 cmH20] 30 cmH20   Intake/Output Summary (Last 24 hours) at 04/07/2022 0742 Last data filed at 04/04/2022 0700 Gross per 24 hour  Intake 4705.76 ml  Output 153 ml  Net 4552.76 ml   Filed Weights   03/22/22 0500 03/15/2022 0500  Weight: 81.6 kg 90.2 kg    Examination: General chronically ill appearing female. Now critically ill on multiple pressors HENT NCAT orally intubated Pulm scattered rhonchi. No accessory use  Card RRR Ext cool and mottled  Abd soft, hypoactive  GU minimal UOP Neuro comatose   Resolved Hospital Problem list     Assessment & Plan:    Cardiac arrest, known h/o CAD and failed PCI Shock c/w cardiogenic shock.  CAD vs PE vs progressive CHF.  Elevated Trop. Acute Hypoxic respiratory failure s/p cardiopulmonary arrest w/ R>L effusion and RLL atx vs PNA(favor aspiration PNA w/ elevated PCt) Acute metabolic encephalopathy. W/ seizure.  Thrombocytopenia (worse) Anemia of chronic disease w/out evidence of bleeding Hypothermia CKD Cr  2.47 (acute on Chronic renal failure) Fluid & Electrolyte imbalance: hyponatremia,  Transaminitis Hypoalbuminemia and protein calorie malnutrition   Discussion Refractory Cardiogenic Shock w/ multiple organ failure. Remains on high pressors, anuric, renal fxn worse, all indices declining. Per my discussion w/ family yesterday this is not survivable  Plan Cont current vent support VAP bundle PAD protocol RASS goal -1 (but focus on comfort) Day 2 abx-->stop vanc (PCR neg)  No further titration of pressors Not candidate for HD I have asked nursing to let me know when family arrives. It is time in my opinion to transition to comfort.   Best Practice (right click and "Reselect all SmartList Selections" daily)   Diet/type: NPO DVT  prophylaxis: SCD none given Thrombocytopenia  GI prophylaxis: PPI Lines: Central line Foley:  N/A Code Status:  DNR Last date of multidisciplinary goals of care discussion [  Will discuss w/ sister when she arrives. I think it is time to transition to comfort.   Critical care time: 32 min    Erick Colace ACNP-BC [place] Pager # [phone] OR # [phone] if no answer

## 2022-04-08 NOTE — Progress Notes (Addendum)
Daily Progress Note   Patient Name: Elizabeth Herring       Date: 04/04/22 DOB: [birth year]  Age: 66 y.o. MRN#: [id number] Attending Physician: Kipp Brood, MD Primary Care Physician: Pcp, No Admit Date: 03/29/2022  Reason for Consultation/Follow-up: Establishing goals of care  Patient Profile/HPI:  66 y.o. female  with past medical history of heart failure, ECHO in October 2022 EF 20%, CAD, HTN, HLD recent admission 11/7-11/13 for CHF exacerbation, dc'd to SNF now readmitted on 03/18/2022 after being found down at SNF in cardiac arrest.  She received CPR by EMS for approximately 20 minutes she was intubated in the field.  On arrival to hospital she was again pulseless and CPR again for 15 minutes.  After the code she was noticed to be having some seizures there are concerns for anoxic brain injury.  She is in cardiogenic shock she is hypotensive she is on epi, levo currently and dobutamine and remains hypotensive despite these.  On admission she had albumin of 1.97 is up to 2.47 after IV repletion.  She has acute kidney injury likely from cardiogenic shock creatinine up to 2.47 at this point.  She has thrombocytopenia. Palliative medicine consulted for goals of care.    Subjective: Chart reviewed including labs, progress notes, imaging from this and previous encounters. Elizabeth Herring is hypotensive and this is worsening despite maximum pressor support.   Per Elizabeth Hammock, RN Elizabeth Arrow, NP met with patient's sister and she has agreed for transition to full comfort measures and extubation, however there is some concern because Elizabeth Herring is not asking to delay extubation and wait for another sister to arrive.  I spoke with Elizabeth Herring-patient's other sister is in route from [place] and should be here in about an  hour.  I reiterated to Elizabeth Herring that Elizabeth Herring may die on the ventilator before her sister is able to arrive.  Elizabeth Herring understands this and is okay with that outcome. I inquired if there were any spiritual or cultural needs.  Patient's sister-in-law was at bedside and she is a Theme park manager.  They do not feel that there are any needs that need to be addressed.  Elizabeth Herring shared her faith that her sister will be healed in heaven.  Review of Systems  Unable to perform ROS: Intubated     Physical Exam Vitals and nursing note reviewed.  Cardiovascular:  Rate and Rhythm: Normal rate.  Pulmonary:     Comments: Intubated Neurological:     Comments: Unresponsive             Vital Signs: BP (!) 70/52   Pulse 75   Temp (!) 96.6 F (35.9 C)   Resp (!) 26   Wt 90.2 kg   SpO2 (!) 89%   BMI 38.84 kg/m  SpO2: SpO2:  (Unable to pick up an acurate SpO2 at this time due to poor perfusion.) O2 Device: O2 Device: Ventilator O2 Flow Rate:    Intake/output summary:  Intake/Output Summary (Last 24 hours) at 2022-04-09 1057 Last data filed at 2022/04/09 0700 Gross per 24 hour  Intake 3202.92 ml  Output 143 ml  Net 3059.92 ml   LBM:   Baseline Weight: Weight: 81.6 kg Most recent weight: Weight: 90.2 kg       Palliative Assessment/Data: PPS: 10%      Patient Active Problem List   Diagnosis Date Noted   Cardiac arrest ([place]) 03/22/2022   Pressure injury of skin 03/17/2022   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   Coronary artery disease involving native coronary artery of native heart without angina pectoris 03/17/2022   Acute on chronic combined systolic and diastolic CHF (congestive heart failure) ([place]) 03/17/2022   Acute exacerbation of CHF (congestive heart failure) ([place]) 03/15/2022   Acute upper gastrointestinal bleeding 08/02/2021   Acute blood loss anemia 08/02/2021   Generalized weakness 08/02/2021   Chronic combined systolic and diastolic heart failure ([place])  08/02/2021   Hyperlipidemia 08/02/2021   Acute lower GI bleeding 08/01/2021   STEMI (ST elevation myocardial infarction) ([place]) 07/18/2020   Ischemic cardiomyopathy 07/18/2020   Smoker 07/18/2020   Pneumonia 07/15/2020   PNA (pneumonia) 07/14/2020   Bronchitis due to tobacco use 07/14/2020   Essential hypertension 07/14/2020    Palliative Care Assessment & Plan    Assessment/Recommendations/Plan  Extubation and comfort measures ordered by attending team Plan to proceed with extubation and DC pressors when another sister arrives-otherwise do not escalate care before then I anticipate that she will die rather quickly once extubated   Code Status: DNR  Prognosis:  Hours - Days  Discharge Planning: Anticipated Hospital Death  Care plan was discussed with patient's family and care team  Thank you for allowing the Palliative Medicine Team to assist in the care of this patient.  Total time: 80 minutes  Greater than 50%  of this time was spent counseling and coordinating care related to the above assessment and plan.  Elizabeth Herring, AGNP-C Palliative Medicine   Please contact Palliative Medicine Team phone at [phone] for questions and concerns.

## 2022-04-08 NOTE — Progress Notes (Signed)
Spoke to sister at bedside. Will be transitioning to comfort.   Simonne Martinet ACNP-BC [facility] Pulmonary/Critical Care Pager # [phone] OR # [phone] if no answer

## 2022-04-08 NOTE — Procedures (Signed)
Extubation Procedure Note  Patient Details:   Name: Elizabeth Herring DOB: 1955-12-04 MRN: [id number]   Airway Documentation:    Vent end date: 04-12-2022 Vent end time: 1340   Evaluation  O2 sats: currently acceptable Complications: No apparent complications Patient did tolerate procedure well. Bilateral Breath Sounds: Clear, Diminished   No  Toula Moos 2022-04-12, 1:42 PM

## 2022-04-08 NOTE — Progress Notes (Signed)
Pt glucose reading 36 upon recheck after previous reading stated less than 10. NP notified, 1 amp of D50 ordered and given. Diabetic coordinator also aware of readings and intervention.

## 2022-04-08 NOTE — Procedures (Addendum)
Patient Name: Josephyne Tarter  MRN: [id number]  Epilepsy Attending: Charlsie Quest  Referring Physician/Provider: Gordy Councilman, MD  Duration: 04/02/22 0426 to Apr 02, 2022 1015   Patient history: 66 year old female with right-sided facial twitching and right head turn.  EEG to evaluate for seizure.   Level of alertness: lethargic /sedated   AEDs during EEG study: LEV   Technical aspects: This EEG study was done with scalp electrodes positioned according to the 10-20 International system of electrode placement. Electrical activity was reviewed with band pass filter of 1-70Hz , sensitivity of 7 uV/mm, display speed of 76mm/sec with a 60Hz  notched filter applied as appropriate. EEG data were recorded continuously and digitally stored.  Video monitoring was available and reviewed as appropriate.   Description: EEG showed continuous generalized 3-5hz  theta-delta slowing. Hyperventilation and photic stimulation were not performed.      ABNORMALITY - Continuous slow, generalized   IMPRESSION: This study is suggestive of severe diffuse encephalopathy, nonspecific etiology. No seizures or epileptiform discharges were seen throughout the recording.   Labrenda Lasky 

## 2022-04-08 NOTE — Progress Notes (Signed)
Neurology Progress Note   S:// Seen and examined. No acute changes. Remains on 4 pressors, still hypotensive.   O:// Current vital signs: BP (!) 80/57   Pulse 79   Temp (!) 96.6 F (35.9 C)   Resp 10   Wt 90.2 kg   SpO2 (!) 89%   BMI 38.84 kg/m  Vital signs in last 24 hours: Temp:  [94.3 F (34.6 C)-99.5 F (37.5 C)] 96.6 F (35.9 C) (11/15 0315) Pulse Rate:  [79-100] 79 (11/15 0700) Resp:  [0-28] 10 (11/15 0700) BP: (73-120)/(37-78) 80/57 (11/15 0700) SpO2:  [87 %-97 %] 89 % (11/15 0700) Arterial Line BP: (72-118)/(38-63) 81/54 (11/15 0700) FiO2 (%):  [70 %] 70 % (11/15 0000) Weight:  [90.2 kg] 90.2 kg (11/15 0500) General: Intubated with minimal to no sedation on board HEENT: Normocephalic atraumatic CVS: Bradycardic and hypotensive Respiratory: Vented Neurological exam Lying in bed intubated with minimal sedation with 25 of fentanyl. No spontaneous movements No movement to name-calling or noxious stimulation Cranial nerves: Pupils are 3 mm very sluggishly reactive bilaterally, corneal reflexes are absent, oculocephalics are present.  Gag is absent.  Cough is present. Motor examination: No spontaneous movement.  No withdrawal to noxious stimulation Sensory examination: As above  Medications  Current Facility-Administered Medications:    0.9 %  sodium chloride infusion (Manually program via Guardrails IV Fluids), , Intravenous, Once, Collier Bullock, MD   calcium gluconate 1 g/ 50 mL sodium chloride IVPB, 1 g, Intravenous, Once, Collier Bullock, MD   ceFEPIme (MAXIPIME) 2 g in sodium chloride 0.9 % 100 mL IVPB, 2 g, Intravenous, Q24H, Kris Mouton, RPH, Stopped at 03/22/22 1024   DOBUTamine (DOBUTREX) infusion 4000 mcg/mL, 2.5-20 mcg/kg/min, Intravenous, Titrated, Carmin Muskrat, MD, Last Rate: 5.78 mL/hr at 04-07-2022 0700, 5 mcg/kg/min at 04/07/2022 0700   docusate (COLACE) 50 MG/5ML liquid 100 mg, 100 mg, Per Tube, BID PRN, Collier Bullock, MD   EPINEPHrine  (ADRENALIN) 5 mg in NS 250 mL (0.02 mg/mL) premix infusion, 0.5-30 mcg/min, Intravenous, Titrated, Ogan, Okoronkwo U, MD, Last Rate: 90 mL/hr at 04/07/22 0700, 30 mcg/min at 04-07-22 0700   fentaNYL (SUBLIMAZE) injection 25-100 mcg, 25-100 mcg, Intravenous, Q2H PRN, Ogan, Okoronkwo U, MD   fentaNYL 2529mcg in NS 251mL (72mcg/ml) infusion-PREMIX, 0-400 mcg/hr, Intravenous, Continuous, Ogan, Okoronkwo U, MD, Last Rate: 2.5 mL/hr at 2022/04/07 0700, 25 mcg/hr at 2022-04-07 0700   insulin aspart (novoLOG) injection 0-9 Units, 0-9 Units, Subcutaneous, Q4H, Erick Colace, NP, 1 Units at 03/22/22 2054   [COMPLETED] levETIRAcetam (KEPPRA) 2,000 mg in sodium chloride 0.9 % 250 mL IVPB, 2,000 mg, Intravenous, Once, Last Rate: 900 mL/hr at 03/22/22 0701, Restarted at 03/22/22 0701 **FOLLOWED BY** levETIRAcetam (KEPPRA) IVPB 500 mg/100 mL premix, 500 mg, Intravenous, Q12H, Bhagat, Srishti L, MD, Stopped at 04/07/22 0505   midazolam (VERSED) injection 3 mg, 3 mg, Intravenous, Once, Ogan, Okoronkwo U, MD   norepinephrine (LEVOPHED) 16 mg in 22mL (0.064 mg/mL) premix infusion, 0-80 mcg/min, Intravenous, Continuous, Ogan, Okoronkwo U, MD, Last Rate: 75 mL/hr at April 07, 2022 0812, 80 mcg/min at 04-07-2022 0812   Oral care mouth rinse, 15 mL, Mouth Rinse, Q2H, Collier Bullock, MD, 15 mL at 2022-04-07 [id number]   Oral care mouth rinse, 15 mL, Mouth Rinse, PRN, Collier Bullock, MD   pantoprazole (PROTONIX) injection 40 mg, 40 mg, Intravenous, Q24H, Collier Bullock, MD, 40 mg at 03/22/22 0955   polyethylene glycol (MIRALAX / GLYCOLAX) packet 17 g, 17 g, Per Tube, Daily PRN, Collier Bullock, MD   vasopressin (  PITRESSIN) 20 Units in sodium chloride 0.9 % 100 mL infusion-*FOR SHOCK*, 0-0.04 Units/min, Intravenous, Continuous, Ogan, Okoronkwo U, MD, Last Rate: 12 mL/hr at 03/17/2022 0700, 0.04 Units/min at 03/31/2022 0700 Labs CBC    Component Value Date/Time   WBC 11.3 (H) 03/28/2022 0504   RBC 3.34 (L) 03/18/2022 0504   HGB 10.7 (L)  04/07/2022 0504   HCT 30.3 (L) 03/31/2022 0504   PLT 58 (L) 04/04/2022 0504   MCV 90.7 03/10/2022 0504   MCH 32.0 04/03/2022 0504   MCHC 35.3 04/05/2022 0504   RDW 18.5 (H) 03/18/2022 0504   LYMPHSABS 0.6 (L) 08/02/2021 0351   MONOABS 0.4 08/02/2021 0351   EOSABS 0.0 08/02/2021 0351   BASOSABS 0.0 08/02/2021 0351    CMP     Component Value Date/Time   NA 130 (L) 03/26/2022 0504   NA 142 02/19/2021 1503   K 4.5 03/28/2022 0504   CL 98 03/13/2022 0504   CO2 20 (L) 04/03/2022 0504   GLUCOSE 72 03/25/2022 0504   BUN 43 (H) 03/20/2022 0504   BUN 15 02/19/2021 1503   CREATININE 2.52 (H) 03/27/2022 0504   CALCIUM 5.6 (LL) 04/05/2022 0504   PROT 5.5 (L) 03/22/2022 0040   ALBUMIN 1.9 (L) 03/22/2022 0040   AST 142 (H) 03/22/2022 0040   ALT 38 03/22/2022 0040   ALKPHOS 222 (H) 03/22/2022 0040   BILITOT 4.8 (H) 03/22/2022 0040   GFRNONAA 21 (L) 03/12/2022 0504   Last night overnight EEG pending.  The night prior, generalized slowing only.  Imaging I have reviewed images in epic and the results pertinent to this consultation are: CT head-no acute changes.  Assessment: 66 year old woman with focal seizure-like activity in the setting of cardiac arrest.  Not typical myoclonic jerking.  Also has refractory cardiogenic shock, acute hypoxic respiratory failure, aspiration pneumonia, AKI on CKD, transaminitis and hyponatremia. She has a very poor neurological exam at this time with minimal brainstem reflexes being present and I was unable to elicit any higher cortical function during my exam. Given her premorbid functional status having been very poor and then having had cardiac arrest now with multiple medical issues involving multiple organs, I do not anticipate a good outcome from a neurologically meaningful recovery standpoint. 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  Recommendations: Bedside evaluation of the EEG did not reveal any ongoing seizure activity or concern for it.  There was only generalized slowing.  I would discontinue LTM EEG once the formal read is made available and there has been no evidence of seizures overnight. Continue supportive care per PCCM as you are and continue family discussions as you are. Continue Keppra for now I am not sure if an MRI is going to change the outcome but if she continues to be a full scope of care, consider MRI at some point. Plan was discussed with Anders Simmonds, NP from the critical care service.   Milon Dikes, MD Neurologist [facility] Pager: [phone]  CRITICAL CARE ATTESTATION Performed by: Milon Dikes, MD Total critical care time: 33 minutes Critical care time was exclusive of separately billable procedures and treating other patients and/or supervising APPs/Residents/Students Critical care was necessary to treat or prevent imminent or life-threatening deterioration due to seizure-like activity in the setting of cardiac arrest and anoxic brain injury. This patient is critically ill and at significant risk  for neurological worsening and/or death and care requires constant monitoring. Critical care was time spent personally by me on the following activities: development of treatment plan with patient and/or surrogate as well as nursing, discussions with consultants, evaluation of patient's response to treatment, examination of patient, obtaining history from patient or surrogate, ordering and performing treatments and interventions, ordering and review of laboratory studies, ordering and review of radiographic studies, pulse oximetry, re-evaluation of patient's condition, participation in multidisciplinary rounds and medical decision making of high complexity in the care of this patient.

## 2022-04-08 NOTE — Discharge Summary (Signed)
DEATH SUMMARY   Patient Details  Name: Elizabeth Herring MRN: [id number] DOB: 1956/01/10  Admission/Discharge Information   Admit Date:  2022-04-03  Date of Death: Date of Death: 2022/04/05  Time of Death: Time of Death: 1351/08/29  Length of Stay: 1  Referring Physician: Pcp, No   Reason(s) for Hospitalization  Cardiac arrest  Diagnoses  Preliminary cause of death: Decompensated systolic heart failure. Secondary Diagnoses (including complications and co-morbidities):  Principal Problem:   Cardiac arrest ([name]) CAD status post failed PCI HFrEF Acute hypoxic respiratory failure Acute anoxic encephalopathy Thrombocytopenia Anemia of chronic disease Severe protein calorie malnutrition Acute on chronic renal failure.  Brief Hospital Course (including significant findings, care, treatment, and services provided and events leading to death)  Elizabeth Herring is a 66 y.o. year old female who had recently been discharged following an exacerbation of known biventricular failure.  She was transferred to a nursing home with a plan for palliative care follow-up at that time.  She presented following a cardiac arrest after having been found down and pulseless. Continued increasing vasopressor requirements.  Worsening renal failure.  Family informed that the patient's demise was inevitable.  Seen by palliative care.  Transitioned to comfort care and compassionately extubated.   Pertinent Labs and Studies  Significant Diagnostic Studies VAS [place] LOWER EXTREMITY VENOUS (DVT)  Result Date: 03/22/2022  Lower Venous DVT Study Patient Name:  Elizabeth Herring  Date of Exam:   03/22/2022 Medical Rec #: [id number]              Accession #:    [id number] Date of Birth: 01-24-1956              Patient Gender: F Patient Age:   45 years Exam Location:  [facility] Procedure:      VAS [place] LOWER EXTREMITY VENOUS (DVT) Referring Phys: Elizabeth Herring  --------------------------------------------------------------------------------  Indications: Edema, and Swelling.  Limitations: Poor ultrasound/tissue interface and edema. Comparison Study: no prior Performing Technologist: Elizabeth Herring RVS  Examination Guidelines: A complete evaluation includes B-mode imaging, spectral Doppler, color Doppler, and power Doppler as needed of all accessible portions of each vessel. Bilateral testing is considered an integral part of a complete examination. Limited examinations for reoccurring indications may be performed as noted. The reflux portion of the exam is performed with the patient in reverse Trendelenburg.  +--------+---------------+---------+-----------+----------+--------------------+ RIGHT   CompressibilityPhasicitySpontaneityPropertiesThrombus Aging       +--------+---------------+---------+-----------+----------+--------------------+ CFV     Full           Yes      Yes                                       +--------+---------------+---------+-----------+----------+--------------------+ SFJ     Full                                                              +--------+---------------+---------+-----------+----------+--------------------+ FV Prox Full                                                              +--------+---------------+---------+-----------+----------+--------------------+  FV Mid                 Yes      Yes                  patent by color                                                           doppler              +--------+---------------+---------+-----------+----------+--------------------+ FV                     Yes      Yes                  patent by color      Distal                                               doppler              +--------+---------------+---------+-----------+----------+--------------------+ PFV     Full                                                               +--------+---------------+---------+-----------+----------+--------------------+ POP     Full           Yes      Yes                                       +--------+---------------+---------+-----------+----------+--------------------+ PTV                                                  patent by color      +--------+---------------+---------+-----------+----------+--------------------+ PERO                   Yes      Yes                  patent by color                                                           doppler              +--------+---------------+---------+-----------+----------+--------------------+   +--------+---------------+---------+-----------+----------+--------------------+ LEFT    CompressibilityPhasicitySpontaneityPropertiesThrombus Aging       +--------+---------------+---------+-----------+----------+--------------------+ CFV     Full           Yes      Yes                                       +--------+---------------+---------+-----------+----------+--------------------+  SFJ     Full                                                              +--------+---------------+---------+-----------+----------+--------------------+ FV Prox Full                                                              +--------+---------------+---------+-----------+----------+--------------------+ FV Mid                 Yes      Yes                  patent by color                                                           doppler              +--------+---------------+---------+-----------+----------+--------------------+ FV                     Yes      Yes                  patent by color      Distal                                               doppler              +--------+---------------+---------+-----------+----------+--------------------+ PFV     Full                                                               +--------+---------------+---------+-----------+----------+--------------------+ POP     Full                                                              +--------+---------------+---------+-----------+----------+--------------------+ PTV                    Yes      Yes                  patent by color  doppler              +--------+---------------+---------+-----------+----------+--------------------+ PERO                   Yes      Yes                  patent by color                                                           doppler              +--------+---------------+---------+-----------+----------+--------------------+     Summary: RIGHT: - There is no evidence of deep vein thrombosis in the lower extremity. However, portions of this examination were limited- see technologist comments above.  - No cystic structure found in the popliteal fossa.  LEFT: - There is no evidence of deep vein thrombosis in the lower extremity. However, portions of this examination were limited- see technologist comments above.  - No cystic structure found in the popliteal fossa.  *See table(s) above for measurements and observations. Electronically signed by Elizabeth Mayo MD on 03/22/2022 at 11:13:43 PM.    Final    Overnight EEG with video  Result Date: 03/22/2022 Elizabeth Havens, MD     04-20-2022  9:33 AM Patient Name: Elizabeth Herring MRN: [id number] Epilepsy Attending: Lora Herring Referring Physician/Provider: Lorenza Chick, MD Duration: 03/22/2022 0426 to Apr 20, 2022 0426 Patient history: 66 year old female with right-sided facial twitching and right head turn.  EEG to evaluate for seizure. Level of alertness: lethargic /sedated AEDs during EEG study: LEV Technical aspects: This EEG study was done with scalp electrodes positioned according to the 10-20 International system of electrode placement.  Electrical activity was reviewed with band pass filter of 1-70Hz , sensitivity of 7 uV/mm, display speed of 72mm/sec with a 60Hz  notched filter applied as appropriate. EEG data were recorded continuously and digitally stored.  Video monitoring was available and reviewed as appropriate. Description: EEG showed continuous generalized low amplitude 2-3Hz  delta slowing admixed with 8-9hz  alpha activity. Hyperventilation and photic stimulation were not performed.   ABNORMALITY - Continuous slow, generalized IMPRESSION: This study is suggestive of severe diffuse encephalopathy, nonspecific etiology. No seizures or epileptiform discharges were seen throughout the recording. Elizabeth Herring   CT CHEST WO CONTRAST  Result Date: 03/22/2022 CLINICAL DATA:  Cardiac arrest EXAM: CT CHEST WITHOUT CONTRAST TECHNIQUE: Multidetector CT imaging of the chest was performed following the standard protocol without IV contrast. RADIATION DOSE REDUCTION: This exam was performed according to the departmental dose-optimization program which includes automated exposure control, adjustment of the mA and/or kV according to patient size and/or use of iterative reconstruction technique. COMPARISON:  None Available. FINDINGS: Cardiovascular: Cardiomegaly. No pericardial effusion. Hypodense blood pool relative to myocardium, suggesting anemia. No evidence of thoracic aortic aneurysm. Atherosclerotic calcifications of the aortic arch. Three vessel coronary atherosclerosis. Left IJ venous catheter terminates at the cavoatrial junction. Mediastinum/Nodes: No suspicious mediastinal lymphadenopathy. Visualized thyroid is unremarkable. Lungs/Pleura: Endotracheal tube terminates at the carina. Moderate right pleural effusion.  Trace left pleural effusion. Compressive atelectasis in the posterior right upper lobe and right lower lobe. Mild patchy/ground-glass opacities in the bilateral upper lobes, with interlobular septal thickening, favoring mild  interstitial edema. No pneumothorax. Upper Abdomen: Visualized upper abdomen is notable  for upper abdominal ascites, vascular calcifications, and enteric tube coursing into the mid stomach. Musculoskeletal: Visualized osseous structures are within normal limits. IMPRESSION: Endotracheal tube terminates at the carina. Additional support apparatus as above. Cardiomegaly with mild interstitial edema. Moderate right and trace left pleural effusions. Associated right lung atelectasis. Upper abdominal ascites, incompletely visualized. Aortic Atherosclerosis (ICD10-I70.0). Electronically Signed   By: Elizabeth Herring M.D.   On: 03/22/2022 03:41   CT HEAD WO CONTRAST (5MM)  Result Date: 03/22/2022 CLINICAL DATA:  Altered mental status, cardiac arrest EXAM: CT HEAD WITHOUT CONTRAST TECHNIQUE: Contiguous axial images were obtained from the base of the skull through the vertex without intravenous contrast. RADIATION DOSE REDUCTION: This exam was performed according to the departmental dose-optimization program which includes automated exposure control, adjustment of the mA and/or kV according to patient size and/or use of iterative reconstruction technique. COMPARISON:  None Available. FINDINGS: Brain: No evidence of acute infarction, hemorrhage, hydrocephalus, extra-axial collection or mass lesion/mass effect. Mild subcortical white matter and periventricular small vessel ischemic changes. Vascular: Intracranial atherosclerosis. Skull: Normal. Negative for fracture or focal lesion. Sinuses/Orbits: The visualized paranasal sinuses are essentially clear. The mastoid air cells are unopacified. Other: None. IMPRESSION: No evidence of acute intracranial abnormality. Mild small vessel ischemic changes. Electronically Signed   By: Elizabeth Herring M.D.   On: 03/22/2022 03:37   DG CHEST PORT 1 VIEW  Result Date: 03/22/2022 CLINICAL DATA:  Central line placement EXAM: PORTABLE CHEST 1 VIEW COMPARISON:  03/22/2022 at 0005  hours FINDINGS: Endotracheal tube terminates 10 mm above the carina. Left IJ venous catheter terminates in the mid SVC. Enteric tube courses into the stomach. Moderate layering right pleural effusion. Left lung is clear. No pneumothorax. The heart is normal in size. Defibrillator pads overlying the left hemithorax. IMPRESSION: Left IJ venous catheter terminates in the mid SVC. No pneumothorax. Endotracheal tube terminates 10 mm above the carina. Moderate layering right pleural effusion. Electronically Signed   By: Elizabeth Herring M.D.   On: 03/22/2022 02:14   DG Chest Portable 1 View  Result Date: 03/22/2022 CLINICAL DATA:  chest pain EXAM: PORTABLE CHEST 1 VIEW COMPARISON:  Chest x-ray 03/15/2022. FINDINGS: Interval placement of an endotracheal tube with tip in the proximal left mainstem bronchi. Enteric tube coursing below the hemidiaphragm with tip and side port collimated off view. Interval placement of a right internal jugular central venous catheter with tip overlying the expected region of the proximal superior vena cava. Limited evaluation due to patient rotation. Cardiac paddles overlie the chest. The heart and mediastinal contours are within normal limits. No focal consolidation. No pulmonary edema. At least small to moderate volume right pleural effusion. No pneumothorax. No acute osseous abnormality. IMPRESSION: 1. Left mainstem bronchi intubation. Recommend retraction of endotracheal tube by 2 cm. 2. Interval placement of a right internal jugular central venous catheter with tip overlying the expected region of the proximal superior vena cava. Limited evaluation due to patient rotation. Recommend attention on follow-up. 3. Enteric tube coursing below the hemidiaphragm with tip and side port collimated off view. 4. At least small to moderate volume right pleural effusion. These results were called by telephone at the time of interpretation on 03/22/2022 at 12:24 am to provider Elizabeth Herring ,  who verbally acknowledged these results. Electronically Signed   By: Elizabeth Herring M.D.   On: 03/22/2022 00:24   ECHOCARDIOGRAM COMPLETE  Result Date: 03/16/2022    ECHOCARDIOGRAM REPORT   Patient Name:   Elizabeth Herring Date of Exam:  03/16/2022 Medical Rec #:  [id number]             Height:       60.0 in Accession #:    [id number]            Weight:       171.1 lb Date of Birth:  05-19-1955             BSA:          1.747 m Patient Age:    66 years              BP:           98/75 mmHg Patient Gender: F                     HR:           99 bpm. Exam Location:  [facility] Procedure: 2D Echo, Cardiac Doppler, Color Doppler and Intracardiac            Opacification Agent Indications:    Cardiomyopathy-Ischemic  History:        Patient has prior history of Echocardiogram examinations, most                 recent 02/11/2021. Cardiomyopathy and CHF, Previous Myocardial                 Infarction; Risk Factors:Hypertension, Current Smoker and                 Dyslipidemia.  Sonographer:    Elizabeth Herring Referring Phys: Gomez Cleverly LAMA IMPRESSIONS  1. Left ventricular ejection fraction, by estimation, is <20%. The left ventricle has severely decreased function. The left ventricle has no regional wall motion abnormalities. The left ventricular internal cavity size was mildly dilated. Left ventricular diastolic parameters are consistent with Grade I diastolic dysfunction (impaired relaxation). There is the interventricular septum is flattened in diastole ('D' shaped left ventricle), consistent with right ventricular volume overload. No evidence of LV thrombus.  2. Right ventricular systolic function is moderately reduced. The right ventricular size is mildly enlarged. Moderately increased right ventricular wall thickness. Unable to calculate PASP due to severe TR.  3. The tricuspid valve is abnormal. Tricuspid valve regurgitation is severe.  4. There is a 0.6 x 0.4 cm mobile echodensity attached to the pulmonic  valve leaflet with motion pattern independent of the pulmonic valve. There is mild to moderate pulmonary valve regurgitation. Differential include fibroelastoma, calcified valve, vegetation, thrombus. Clinical correlation is recommended.  5. The inferior vena cava is dilated in size with <50% respiratory variability, suggesting right atrial pressure of 15 mmHg.  6. Large pleural effusion in both left and right lateral regions. Comparison(s): The mobile echodensity on pulmonic valve was present on prior Echo from 02/2021. FINDINGS  Left Ventricle: Left ventricular ejection fraction, by estimation, is <20%. The left ventricle has severely decreased function. The left ventricle has no regional wall motion abnormalities. Definity contrast agent was given IV to delineate the left ventricular endocardial borders. The left ventricular internal cavity size was mildly dilated. There is no left ventricular hypertrophy. The interventricular septum is flattened in diastole ('D' shaped left ventricle), consistent with right ventricular volume overload. Left ventricular diastolic parameters are consistent with Grade I diastolic dysfunction (impaired relaxation).  LV Wall Scoring: There is diffuse hypokinesis. Right Ventricle: The right ventricular size is mildly enlarged. Moderately increased right ventricular wall thickness. Right ventricular systolic function is moderately reduced. The tricuspid  regurgitant velocity is 1.96 m/s, and with an assumed right atrial pressure of 15 mmHg, the estimated right ventricular systolic pressure is [id number] mmHg. Left Atrium: Left atrial size was mildly dilated. Right Atrium: Right atrial size was severely dilated. Pericardium: There is no evidence of pericardial effusion. Mitral Valve: The mitral valve is abnormal. Severely decreased mobility of the mitral valve leaflets. Trivial mitral valve regurgitation. No evidence of mitral valve stenosis. MV peak gradient, 8.9 mmHg. The mean mitral valve  gradient is 4.0 mmHg. Tricuspid Valve: The tricuspid valve is abnormal. Tricuspid valve regurgitation is severe. No evidence of tricuspid stenosis. Aortic Valve: The aortic valve is tricuspid. Aortic valve regurgitation is not visualized. No aortic stenosis is present. Aortic valve mean gradient measures 3.0 mmHg. Aortic valve peak gradient measures 5.3 mmHg. Aortic valve area, by VTI measures 1.67 cm. Pulmonic Valve: There is a 0.6 x 0.4 cm mobile echodensity attached to the pulmonic valve leaflet with motion pattern independent of the pulmonic valve. Differential include fibroelastoma, calcified valve, vegetation, thrombus. Clinical correlation is recommended. Pulmonic valve regurgitation is mild to moderate. No evidence of pulmonic stenosis. Aorta: The aortic root is normal in size and structure. Venous: The inferior vena cava is dilated in size with less than 50% respiratory variability, suggesting right atrial pressure of 15 mmHg. IAS/Shunts: There is left bowing of the interatrial septum, suggestive of elevated right atrial pressure. No atrial level shunt detected by color flow Doppler. Additional Comments: There is a large pleural effusion in both left and right lateral regions. Mild ascites is present.  LEFT VENTRICLE PLAX 2D LVIDd:         6.00 cm LVIDs:         5.80 cm LV PW:         1.00 cm LV IVS:        1.00 cm LVOT diam:     1.90 cm LV SV:         32 LV SV Index:   18 LVOT Area:     2.84 cm  LV Volumes (MOD) LV vol d, MOD A4C: 112.0 ml LV SV MOD A4C:     112.0 ml RIGHT VENTRICLE RV Basal diam:  3.65 cm RV Mid diam:    3.40 cm RV S prime:     8.15 cm/s TAPSE (M-mode): 1.5 cm LEFT ATRIUM             Index        RIGHT ATRIUM           Index LA diam:        4.60 cm 2.63 cm/m   RA Area:     31.90 cm LA Vol (A2C):   77.0 ml 44.08 ml/m  RA Volume:   124.00 ml 70.99 ml/m LA Vol (A4C):   63.8 ml 36.53 ml/m LA Biplane Vol: 69.5 ml 39.79 ml/m  AORTIC VALVE                    PULMONIC VALVE AV Area  (Vmax):    2.31 cm     PV Vmax:       1.05 m/s AV Area (Vmean):   1.92 cm     PV Peak grad:  4.4 mmHg AV Area (VTI):     1.67 cm AV Vmax:           115.00 cm/s AV Vmean:          85.400 cm/s AV VTI:  0.192 m AV Peak Grad:      5.3 mmHg AV Mean Grad:      3.0 mmHg LVOT Vmax:         93.80 cm/s LVOT Vmean:        57.800 cm/s LVOT VTI:          0.113 m LVOT/AV VTI ratio: 0.59  AORTA Ao Root diam: 2.60 cm MITRAL VALVE                TRICUSPID VALVE MV Area (PHT): 5.62 cm     TR Peak grad:   15.4 mmHg MV Area VTI:   1.15 cm     TR Vmax:        196.00 cm/s MV Peak grad:  8.9 mmHg MV Mean grad:  4.0 mmHg     SHUNTS MV Vmax:       1.49 m/s     Systemic VTI:  0.11 m MV Vmean:      88.9 cm/s    Systemic Diam: 1.90 cm MV Decel Time: 135 msec MV E velocity: 84.40 cm/s MV A velocity: 126.00 cm/s MV E/A ratio:  0.67 Elizabeth Herring Electronically signed by Elizabeth Herring Signature Date/Time: 03/16/2022/2:35:24 PM    Final    US Abdomen Limited  Result Date: 03/16/2022 CLINICAL DATA:  Elevated bilirubin EXAM: ULTRASOUND ABDOMEN LIMITED RIGHT UPPER QUADRANT COMPARISON:  None Available. FINDINGS: Gallbladder: No visible gallstones. Small amount of sludge in the gallbladder. There is regional ascites. Patient unable to communicate for determination of Murphy sign. Common bile duct: Diameter: No evidence of intrahepatic ductal dilatation. Common duct not well seen because of bowel gas and ascites. Liver: Overall normal echogenicity. Mild nodularity of the surface suggesting cirrhosis. No focal lesion. Portal vein is patent on color Doppler imaging with normal direction of blood flow towards the liver. Other: None. IMPRESSION: 1. Mild nodularity of the liver surface suggesting cirrhosis. No focal lesion. 2. Small amount of sludge in the gallbladder. No visible gallstones. 3. Perihepatic ascites. 4. Common duct not well seen because of bowel gas and ascites. No evidence of intrahepatic ductal  dilatation. Electronically Signed   By: Elizabeth Herring M.D.   On: 03/16/2022 09:05   DG Chest Port 1 View  Result Date: 03/15/2022 CLINICAL DATA:  Shortness of breath, BILATERAL lower extremity swelling for 2-3 days, history CHF EXAM: PORTABLE CHEST 1 VIEW COMPARISON:  Portable exam 1223 hours compared to 07/15/2020 FINDINGS: Enlargement of cardiac silhouette with pulmonary vascular congestion. Atherosclerotic calcification aorta. Bibasilar effusions and atelectasis. No definite infiltrate or edema. No pneumothorax. Skin folds project over LEFT chest. Bones demineralized. IMPRESSION: Enlargement of cardiac silhouette with slight pulmonary vascular congestion. Bibasilar pleural effusions and atelectasis. Aortic Atherosclerosis (ICD10-I70.0). Electronically Signed   By: Ulyses Southward M.D.   On: 03/15/2022 12:33    Microbiology No results found for this or any previous visit (from the past 240 hour(s)).  Lab Basic Metabolic Panel: No results for input(s): "NA", "K", "CL", "CO2", "GLUCOSE", "BUN", "CREATININE", "CALCIUM", "MG", "PHOS" in the last 168 hours. Liver Function Tests: No results for input(s): "AST", "ALT", "ALKPHOS", "BILITOT", "PROT", "ALBUMIN" in the last 168 hours. No results for input(s): "LIPASE", "AMYLASE" in the last 168 hours. No results for input(s): "AMMONIA" in the last 168 hours. CBC: No results for input(s): "WBC", "NEUTROABS", "HGB", "HCT", "MCV", "PLT" in the last 168 hours. Cardiac Enzymes: No results for input(s): "CKTOTAL", "CKMB", "CKMBINDEX", "TROPONINI" in the last 168 hours. Sepsis Labs: No results for  input(s): "PROCALCITON", "WBC", "LATICACIDVEN" in the last 168 hours.  Procedures/Operations  Mechanical ventilation, arterial line, central line.   Dondrea Clendenin 04/04/2022, 4:37 PM

## 2022-04-08 DEATH — deceased

## 2022-04-11 ENCOUNTER — Ambulatory Visit: Payer: Medicaid Other | Admitting: Cardiovascular Disease
